# Patient Record
Sex: Male | Born: 1960 | Race: White | Hispanic: No | Smoking: Former smoker
Health system: Southern US, Community
[De-identification: ages and names within clinical notes are randomized; demographics above are authoritative.]

## PROBLEM LIST (undated history)

## (undated) DIAGNOSIS — T4145XA Adverse effect of unspecified anesthetic, initial encounter: Secondary | ICD-10-CM

## (undated) DIAGNOSIS — M1990 Unspecified osteoarthritis, unspecified site: Secondary | ICD-10-CM

## (undated) DIAGNOSIS — F419 Anxiety disorder, unspecified: Secondary | ICD-10-CM

## (undated) DIAGNOSIS — Z973 Presence of spectacles and contact lenses: Secondary | ICD-10-CM

## (undated) DIAGNOSIS — R2689 Other abnormalities of gait and mobility: Secondary | ICD-10-CM

## (undated) DIAGNOSIS — M75102 Unspecified rotator cuff tear or rupture of left shoulder, not specified as traumatic: Secondary | ICD-10-CM

## (undated) DIAGNOSIS — R41840 Attention and concentration deficit: Secondary | ICD-10-CM

## (undated) DIAGNOSIS — E785 Hyperlipidemia, unspecified: Secondary | ICD-10-CM

## (undated) DIAGNOSIS — F329 Major depressive disorder, single episode, unspecified: Secondary | ICD-10-CM

## (undated) DIAGNOSIS — K648 Other hemorrhoids: Secondary | ICD-10-CM

## (undated) DIAGNOSIS — F32A Depression, unspecified: Secondary | ICD-10-CM

## (undated) DIAGNOSIS — F1020 Alcohol dependence, uncomplicated: Secondary | ICD-10-CM

## (undated) DIAGNOSIS — I10 Essential (primary) hypertension: Secondary | ICD-10-CM

## (undated) DIAGNOSIS — M75101 Unspecified rotator cuff tear or rupture of right shoulder, not specified as traumatic: Secondary | ICD-10-CM

## (undated) DIAGNOSIS — G709 Myoneural disorder, unspecified: Secondary | ICD-10-CM

## (undated) DIAGNOSIS — K5790 Diverticulosis of intestine, part unspecified, without perforation or abscess without bleeding: Secondary | ICD-10-CM

## (undated) DIAGNOSIS — T8859XA Other complications of anesthesia, initial encounter: Secondary | ICD-10-CM

## (undated) DIAGNOSIS — K219 Gastro-esophageal reflux disease without esophagitis: Secondary | ICD-10-CM

## (undated) DIAGNOSIS — F429 Obsessive-compulsive disorder, unspecified: Secondary | ICD-10-CM

## (undated) HISTORY — DX: Depression, unspecified: F32.A

## (undated) HISTORY — DX: Other abnormalities of gait and mobility: R26.89

## (undated) HISTORY — PX: KNEE ARTHROSCOPY: SHX127

## (undated) HISTORY — DX: Unspecified osteoarthritis, unspecified site: M19.90

## (undated) HISTORY — PX: INGUINAL HERNIA REPAIR: SHX194

## (undated) HISTORY — PX: KNEE SURGERY: SHX244

## (undated) HISTORY — PX: TONSILLECTOMY: SUR1361

## (undated) HISTORY — DX: Major depressive disorder, single episode, unspecified: F32.9

## (undated) HISTORY — DX: Hyperlipidemia, unspecified: E78.5

## (undated) HISTORY — DX: Obsessive-compulsive disorder, unspecified: F42.9

## (undated) HISTORY — DX: Alcohol dependence, uncomplicated: F10.20

## (undated) HISTORY — DX: Essential (primary) hypertension: I10

## (undated) HISTORY — PX: TYMPANOSTOMY TUBE PLACEMENT: SHX32

## (undated) HISTORY — DX: Attention and concentration deficit: R41.840

## (undated) HISTORY — DX: Myoneural disorder, unspecified: G70.9

---

## 2002-08-10 ENCOUNTER — Emergency Department (HOSPITAL_COMMUNITY): Admission: EM | Admit: 2002-08-10 | Discharge: 2002-08-10 | Payer: Self-pay | Admitting: Emergency Medicine

## 2006-08-17 HISTORY — PX: ACHILLES TENDON REPAIR: SUR1153

## 2008-03-08 ENCOUNTER — Encounter: Admission: RE | Admit: 2008-03-08 | Discharge: 2008-03-08 | Payer: Self-pay | Admitting: Orthopedic Surgery

## 2008-04-04 ENCOUNTER — Encounter: Admission: RE | Admit: 2008-04-04 | Discharge: 2008-04-04 | Payer: Self-pay | Admitting: Orthopedic Surgery

## 2009-08-17 HISTORY — PX: ROTATOR CUFF REPAIR: SHX139

## 2010-02-19 ENCOUNTER — Encounter: Admission: RE | Admit: 2010-02-19 | Discharge: 2010-02-19 | Payer: Self-pay | Admitting: Orthopedic Surgery

## 2010-08-17 DIAGNOSIS — K648 Other hemorrhoids: Secondary | ICD-10-CM

## 2010-08-17 DIAGNOSIS — K5790 Diverticulosis of intestine, part unspecified, without perforation or abscess without bleeding: Secondary | ICD-10-CM

## 2010-08-17 HISTORY — PX: ABCESS DRAINAGE: SHX399

## 2010-08-17 HISTORY — DX: Other hemorrhoids: K64.8

## 2010-08-17 HISTORY — PX: COLONOSCOPY: SHX174

## 2010-08-17 HISTORY — DX: Diverticulosis of intestine, part unspecified, without perforation or abscess without bleeding: K57.90

## 2010-10-07 ENCOUNTER — Emergency Department (HOSPITAL_COMMUNITY)
Admission: EM | Admit: 2010-10-07 | Discharge: 2010-10-07 | Disposition: A | Discharge status: Home / Self Care | Payer: Self-pay | Attending: Emergency Medicine | Admitting: Emergency Medicine

## 2010-10-07 DIAGNOSIS — L03211 Cellulitis of face: Secondary | ICD-10-CM | POA: Insufficient documentation

## 2010-10-07 DIAGNOSIS — I10 Essential (primary) hypertension: Secondary | ICD-10-CM | POA: Insufficient documentation

## 2010-10-07 DIAGNOSIS — L0201 Cutaneous abscess of face: Secondary | ICD-10-CM | POA: Insufficient documentation

## 2010-10-07 DIAGNOSIS — R209 Unspecified disturbances of skin sensation: Secondary | ICD-10-CM | POA: Insufficient documentation

## 2010-10-07 DIAGNOSIS — R51 Headache: Secondary | ICD-10-CM | POA: Insufficient documentation

## 2010-10-08 ENCOUNTER — Ambulatory Visit (HOSPITAL_BASED_OUTPATIENT_CLINIC_OR_DEPARTMENT_OTHER)
Admission: RE | Admit: 2010-10-08 | Discharge: 2010-10-08 | Disposition: A | Discharge status: Home / Self Care | Payer: Self-pay | Source: Ambulatory Visit | Attending: Otolaryngology | Admitting: Otolaryngology

## 2010-10-08 DIAGNOSIS — L0201 Cutaneous abscess of face: Secondary | ICD-10-CM | POA: Insufficient documentation

## 2010-10-08 DIAGNOSIS — L03211 Cellulitis of face: Secondary | ICD-10-CM | POA: Insufficient documentation

## 2010-10-08 DIAGNOSIS — Z01812 Encounter for preprocedural laboratory examination: Secondary | ICD-10-CM | POA: Insufficient documentation

## 2010-10-08 LAB — POCT I-STAT, CHEM 8
BUN: 24 mg/dL — ABNORMAL HIGH (ref 6–23)
Calcium, Ion: 1.12 mmol/L (ref 1.12–1.32)
Chloride: 104 mEq/L (ref 96–112)
Creatinine, Ser: 1.1 mg/dL (ref 0.4–1.5)
Glucose, Bld: 108 mg/dL — ABNORMAL HIGH (ref 70–99)
HCT: 42 % (ref 39.0–52.0)
Hemoglobin: 14.3 g/dL (ref 13.0–17.0)
Potassium: 3.8 mEq/L (ref 3.5–5.1)
Sodium: 138 mEq/L (ref 135–145)
TCO2: 27 mmol/L (ref 0–100)

## 2010-10-11 LAB — CULTURE, ROUTINE-ABSCESS

## 2010-10-13 LAB — ANAEROBIC CULTURE

## 2010-10-15 NOTE — Op Note (Signed)
  NAMECOOLIDGE, GOSSARD               ACCOUNT NO.:  [id number]  MEDICAL RECORD NO.:  [id number]           PATIENT TYPE:  E  LOCATION:  [facility]                         FACILITY:  [facility]  PHYSICIAN:  Betzayda Braxton E. Ezzard Standing, M.D.DATE OF BIRTH:  1961-04-27  DATE OF PROCEDURE:  10/08/2010 DATE OF DISCHARGE:  10/07/2010                              OPERATIVE REPORT   PREOPERATIVE DIAGNOSIS:  Nasal abscess.  POSTOPERATIVE DIAGNOSIS:  Nasal abscess.  OPERATION:  Incision and drainage of nasal abscess with cultures.  SURGEON:  Kristine Garbe. Ezzard Standing, MD  ANESTHESIA:  General endotracheal.  COMPLICATIONS:  None.  BRIEF CLINICAL NOTE:  Adam Harvey is a 50 year old gentleman who developed a sore in his nose, this past weekend, and it has gradually gotten worse.  He had swelling of the upper lip in the base of the nose. He was seen in the emergency room yesterday where he had attempted incision and drainage of the abscess and he was given IV antibiotics as well as doxycycline.  Symptoms have progressively gotten worse over the past 24 hours since being seen in the emergency room and he presented to my office.  On examination, he has an abscess at the base of the columella extending to the upper lip with a scab on the left inferior nostril.  He really did not tolerate the attempted incision and drainage of the abscess in the emergency room well yesterday at all and would rather undergo anesthesia to have incision and drainage of abscess.  He is taken to operating room at this time for incision and drainage of the left nasal abscess.  DESCRIPTION OF PROCEDURE:  After adequate endotracheal anesthesia, the patient received 1 g of Ancef IV preoperatively.  The old scab was removed on the left side of the inferior nostril and pressure was placed on the abscess and a little bit of pus was obtained and this was cultured for regular culture, as well as anaerobe and aerobe.  The opening of the abscess  was then enlarged with a hemostat, the abscess cavity which was estimated to be about 2-3 mL in size, was then irrigated with saline and hydrogen peroxide and about 8-9 cm of quarter- inch Iodoform gauze packing was placed in the abscess cavity.  This completed the procedure.  Antibiotic ointment was applied and he was awoken from anesthesia and transferred to the recovery room and postop doing well.  DISPOSITION:  Adam Harvey was discharged home later this afternoon on continuation of the doxycycline in addition to Keflex 500 mg q.i.d. for 5 days.  He was instructed to remove 2 inches of the Iodoform gauze packing tomorrow and the remaining packing on Friday.  We will notify our office if he has any further problems.          ______________________________ Kristine Garbe Ezzard Standing, M.D.     CEN/MEDQ  D:  10/08/2010  T:  10/09/2010  Job:  [id number]  Electronically Signed by Dillard Cannon M.D. on 10/15/2010 01:22:09 PM

## 2011-04-29 ENCOUNTER — Encounter: Payer: Self-pay | Admitting: *Deleted

## 2011-04-30 ENCOUNTER — Ambulatory Visit (INDEPENDENT_AMBULATORY_CARE_PROVIDER_SITE_OTHER): Payer: 59 | Admitting: Internal Medicine

## 2011-04-30 ENCOUNTER — Encounter: Payer: Self-pay | Admitting: Internal Medicine

## 2011-04-30 VITALS — BP 134/80 | HR 62 | Ht 72.0 in | Wt 241.0 lb

## 2011-04-30 DIAGNOSIS — K625 Hemorrhage of anus and rectum: Secondary | ICD-10-CM

## 2011-04-30 DIAGNOSIS — K5900 Constipation, unspecified: Secondary | ICD-10-CM

## 2011-04-30 DIAGNOSIS — Z1211 Encounter for screening for malignant neoplasm of colon: Secondary | ICD-10-CM

## 2011-04-30 MED ORDER — PEG-KCL-NACL-NASULF-NA ASC-C 100 G PO SOLR: 1.0000 | Freq: Once | ORAL | Status: DC

## 2011-04-30 NOTE — Patient Instructions (Signed)
Colon [facility] 05/06/11 3:00 pm arrive at 2:00 pm on 4th floor Moviprep sent to your pharmcy Colon Brochure given for you to read.

## 2011-04-30 NOTE — Progress Notes (Signed)
HISTORY OF PRESENT ILLNESS:  Adam Harvey is a 50 y.o. male with the below listed medical history who presents today regarding rectal bleeding and the need for screening colonoscopy. The patient reports a 3-4 month history of intermittent rectal bleeding as manifested by bright red blood on the tissue as well as admixed with the stool. This has gotten progressively worse with time. He also reports some rectal discomfort associated with the bleeding as well as increased tendency toward constipation. No abdominal pain or weight loss. No family history of colon cancer. No prior history of colonoscopy. His GI review of systems is otherwise remarkable for occasional heartburn and indigestion. Review of outside laboratories 5 Hemoccult-negative stool in February of 2012. Normal hemoglobin of 14.1 in August of 2012. He does report a remote history of perirectal abscess which required drainage  REVIEW OF SYSTEMS:  All non-GI ROS negative.  Past Medical History  Diagnosis Date  . Hyperlipidemia   . Hypertension   . Obsessive compulsive disorder   . Osteoarthritis   . Depression   . Arthritis   . Alcoholism     sober since 01-2011   . Hemorrhoids     Past Surgical History  Procedure Date  . Achilles tendon repair   . Knee surgery     x 7     Social History Adam Harvey  reports that he has quit smoking. He has never used smokeless tobacco. He reports that he does not drink alcohol or use illicit drugs.  family history is negative for Colon cancer.  No Known Allergies     PHYSICAL EXAMINATION: Vital signs: BP 134/80  Pulse 62  Ht 6' (1.829 m)  Wt 241 lb (109.317 kg)  BMI 32.69 kg/m2  Constitutional: generally well-appearing, no acute distress Psychiatric: alert and oriented x3, cooperative Eyes: extraocular movements intact, anicteric, conjunctiva pink Mouth: oral pharynx moist, no lesions Neck: supple no lymphadenopathy Cardiovascular: heart regular rate and rhythm, no  murmur Lungs: clear to auscultation bilaterally Abdomen: soft, nontender, nondistended, no obvious ascites, no peritoneal signs, normal bowel sounds, no organomegaly Rectal: Deferred until colonoscopy Extremities: no lower extremity edema bilaterally Skin: no lesions on visible extremities Neuro: No focal deficits.   ASSESSMENT:  #1. Intermittent rectal bleeding associated with constipation and rectal discomfort. Rule out seizure. Rule out hemorrhoid. Rule out neoplasia. #2. Colon cancer screening. Baseline risk. Appropriate candidate without contraindication   PLAN:  #1. Colonoscopy.The nature of the procedure, as well as the risks, benefits, and alternatives were carefully and thoroughly reviewed with the patient. Ample time for discussion and questions allowed. The patient understood, was satisfied, and agreed to proceed. Movi prep prescribed. The patient instructed on its use #2. If benign anorectal pathology, treatment plan post procedure

## 2011-05-06 ENCOUNTER — Encounter: Payer: Self-pay | Admitting: Internal Medicine

## 2011-05-06 ENCOUNTER — Ambulatory Visit (AMBULATORY_SURGERY_CENTER): Payer: 59 | Admitting: Internal Medicine

## 2011-05-06 VITALS — HR 65 | Temp 97.5°F | Resp 16 | Ht 72.0 in | Wt 241.0 lb

## 2011-05-06 DIAGNOSIS — K625 Hemorrhage of anus and rectum: Secondary | ICD-10-CM

## 2011-05-06 DIAGNOSIS — K5289 Other specified noninfective gastroenteritis and colitis: Secondary | ICD-10-CM

## 2011-05-06 DIAGNOSIS — Z1211 Encounter for screening for malignant neoplasm of colon: Secondary | ICD-10-CM

## 2011-05-06 DIAGNOSIS — K529 Noninfective gastroenteritis and colitis, unspecified: Secondary | ICD-10-CM

## 2011-05-06 DIAGNOSIS — K5730 Diverticulosis of large intestine without perforation or abscess without bleeding: Secondary | ICD-10-CM

## 2011-05-06 MED ORDER — SODIUM CHLORIDE 0.9 % IV SOLN: 500.0000 mL | INTRAVENOUS | Status: DC

## 2011-05-06 NOTE — Patient Instructions (Signed)
Discharge instructions given with verbal understanding. Handouts on diverticulosis and hemorrhoids given. Resume previous medications. 

## 2011-05-07 ENCOUNTER — Telehealth: Payer: Self-pay | Admitting: *Deleted

## 2011-05-07 NOTE — Telephone Encounter (Signed)

## 2014-05-17 ENCOUNTER — Other Ambulatory Visit (INDEPENDENT_AMBULATORY_CARE_PROVIDER_SITE_OTHER): Payer: Self-pay | Admitting: General Surgery

## 2014-05-17 DIAGNOSIS — IMO0002 Reserved for concepts with insufficient information to code with codable children: Secondary | ICD-10-CM

## 2014-05-17 DIAGNOSIS — R229 Localized swelling, mass and lump, unspecified: Principal | ICD-10-CM

## 2014-05-20 LAB — WOUND CULTURE
Gram Stain: NONE SEEN
Gram Stain: NONE SEEN

## 2014-08-01 ENCOUNTER — Encounter (HOSPITAL_BASED_OUTPATIENT_CLINIC_OR_DEPARTMENT_OTHER): Payer: Self-pay | Admitting: *Deleted

## 2014-08-01 ENCOUNTER — Other Ambulatory Visit: Payer: Self-pay

## 2014-08-01 ENCOUNTER — Encounter (HOSPITAL_BASED_OUTPATIENT_CLINIC_OR_DEPARTMENT_OTHER)
Admission: RE | Admit: 2014-08-01 | Discharge: 2014-08-01 | Disposition: A | Discharge status: Home / Self Care | Payer: 59 | Source: Ambulatory Visit | Attending: Orthopedic Surgery | Admitting: Orthopedic Surgery

## 2014-08-01 DIAGNOSIS — Z01818 Encounter for other preprocedural examination: Secondary | ICD-10-CM | POA: Diagnosis present

## 2014-08-01 LAB — BASIC METABOLIC PANEL
Anion gap: 14 (ref 5–15)
BUN: 26 mg/dL — ABNORMAL HIGH (ref 6–23)
CO2: 23 mEq/L (ref 19–32)
Calcium: 9.3 mg/dL (ref 8.4–10.5)
Chloride: 101 mEq/L (ref 96–112)
Creatinine, Ser: 0.85 mg/dL (ref 0.50–1.35)
GFR calc Af Amer: >90 mL/min (ref >90)
GFR calc non Af Amer: >90 mL/min (ref >90)
Glucose, Bld: 140 mg/dL — ABNORMAL HIGH (ref 70–99)
Potassium: 4.5 mEq/L (ref 3.7–5.3)
Sodium: 138 mEq/L (ref 137–147)

## 2014-08-01 NOTE — Progress Notes (Signed)
   08/01/14 1045  OBSTRUCTIVE SLEEP APNEA  Have you ever been diagnosed with sleep apnea through a sleep study? No  Do you snore loudly (loud enough to be heard through closed doors)?  0  Do you often feel tired, fatigued, or sleepy during the daytime? 0  Has anyone observed you stop breathing during your sleep? 0  Do you have, or are you being treated for high blood pressure? 1  BMI more than 35 kg/m2? 0  Age over 53 years old? 1  Neck circumference greater than 40 cm/16 inches? 1  Gender: 1  Obstructive Sleep Apnea Score 4  Score 4 or greater  Results sent to PCP

## 2014-08-01 NOTE — Progress Notes (Signed)
To come in for [id number] 

## 2014-08-03 ENCOUNTER — Encounter (HOSPITAL_BASED_OUTPATIENT_CLINIC_OR_DEPARTMENT_OTHER): Payer: Self-pay

## 2014-08-03 ENCOUNTER — Ambulatory Visit (HOSPITAL_BASED_OUTPATIENT_CLINIC_OR_DEPARTMENT_OTHER): Payer: BC Managed Care – PPO | Admitting: Anesthesiology

## 2014-08-03 ENCOUNTER — Ambulatory Visit (HOSPITAL_BASED_OUTPATIENT_CLINIC_OR_DEPARTMENT_OTHER)
Admission: RE | Admit: 2014-08-03 | Discharge: 2014-08-03 | Disposition: A | Discharge status: Home / Self Care | Payer: BC Managed Care – PPO | Source: Ambulatory Visit | Attending: Orthopedic Surgery | Admitting: Orthopedic Surgery

## 2014-08-03 ENCOUNTER — Encounter (HOSPITAL_BASED_OUTPATIENT_CLINIC_OR_DEPARTMENT_OTHER)
Admission: RE | Disposition: A | Discharge status: Home / Self Care | Payer: Self-pay | Source: Ambulatory Visit | Attending: Orthopedic Surgery

## 2014-08-03 DIAGNOSIS — F329 Major depressive disorder, single episode, unspecified: Secondary | ICD-10-CM | POA: Diagnosis not present

## 2014-08-03 DIAGNOSIS — M19011 Primary osteoarthritis, right shoulder: Secondary | ICD-10-CM | POA: Insufficient documentation

## 2014-08-03 DIAGNOSIS — I10 Essential (primary) hypertension: Secondary | ICD-10-CM | POA: Insufficient documentation

## 2014-08-03 DIAGNOSIS — F42 Obsessive-compulsive disorder: Secondary | ICD-10-CM | POA: Diagnosis not present

## 2014-08-03 DIAGNOSIS — X58XXXA Exposure to other specified factors, initial encounter: Secondary | ICD-10-CM | POA: Insufficient documentation

## 2014-08-03 DIAGNOSIS — M75101 Unspecified rotator cuff tear or rupture of right shoulder, not specified as traumatic: Secondary | ICD-10-CM | POA: Diagnosis present

## 2014-08-03 DIAGNOSIS — Z7982 Long term (current) use of aspirin: Secondary | ICD-10-CM | POA: Insufficient documentation

## 2014-08-03 DIAGNOSIS — E785 Hyperlipidemia, unspecified: Secondary | ICD-10-CM | POA: Insufficient documentation

## 2014-08-03 DIAGNOSIS — Z800 Family history of malignant neoplasm of digestive organs: Secondary | ICD-10-CM | POA: Diagnosis not present

## 2014-08-03 DIAGNOSIS — Y939 Activity, unspecified: Secondary | ICD-10-CM | POA: Insufficient documentation

## 2014-08-03 DIAGNOSIS — Z79899 Other long term (current) drug therapy: Secondary | ICD-10-CM | POA: Insufficient documentation

## 2014-08-03 DIAGNOSIS — M7541 Impingement syndrome of right shoulder: Secondary | ICD-10-CM | POA: Insufficient documentation

## 2014-08-03 DIAGNOSIS — F419 Anxiety disorder, unspecified: Secondary | ICD-10-CM | POA: Diagnosis not present

## 2014-08-03 DIAGNOSIS — Y999 Unspecified external cause status: Secondary | ICD-10-CM | POA: Insufficient documentation

## 2014-08-03 DIAGNOSIS — F1021 Alcohol dependence, in remission: Secondary | ICD-10-CM | POA: Diagnosis not present

## 2014-08-03 DIAGNOSIS — Y929 Unspecified place or not applicable: Secondary | ICD-10-CM | POA: Insufficient documentation

## 2014-08-03 DIAGNOSIS — Z87891 Personal history of nicotine dependence: Secondary | ICD-10-CM | POA: Insufficient documentation

## 2014-08-03 DIAGNOSIS — S43421A Sprain of right rotator cuff capsule, initial encounter: Secondary | ICD-10-CM | POA: Insufficient documentation

## 2014-08-03 DIAGNOSIS — S46011A Strain of muscle(s) and tendon(s) of the rotator cuff of right shoulder, initial encounter: Secondary | ICD-10-CM | POA: Diagnosis present

## 2014-08-03 HISTORY — DX: Unspecified rotator cuff tear or rupture of right shoulder, not specified as traumatic: M75.101

## 2014-08-03 HISTORY — PX: ARTHOSCOPIC ROTAOR CUFF REPAIR: SHX5002

## 2014-08-03 HISTORY — DX: Presence of spectacles and contact lenses: Z97.3

## 2014-08-03 HISTORY — DX: Other complications of anesthesia, initial encounter: T88.59XA

## 2014-08-03 HISTORY — DX: Anxiety disorder, unspecified: F41.9

## 2014-08-03 HISTORY — DX: Adverse effect of unspecified anesthetic, initial encounter: T41.45XA

## 2014-08-03 LAB — POCT HEMOGLOBIN-HEMACUE: Hemoglobin: 15.1 g/dL (ref 13.0–17.0)

## 2014-08-03 SURGERY — SHOULDER ARTHROSCOPY WITH SUBACROMIAL DECOMPRESSION AND DISTAL CLAVICLE EXCISION
Anesthesia: Regional | Site: Shoulder | Laterality: Right

## 2014-08-03 MED ORDER — LACTATED RINGERS IV SOLN
INTRAVENOUS | Status: DC
  Administered 2014-08-03: 14:00:00 via INTRAVENOUS

## 2014-08-03 MED ORDER — FENTANYL CITRATE 0.05 MG/ML IJ SOLN
INTRAMUSCULAR | Status: AC
  Filled 2014-08-03: qty 2

## 2014-08-03 MED ORDER — SUCCINYLCHOLINE CHLORIDE 20 MG/ML IJ SOLN
INTRAMUSCULAR | Status: DC | PRN
  Administered 2014-08-03: 100 mg via INTRAVENOUS

## 2014-08-03 MED ORDER — EPHEDRINE SULFATE 50 MG/ML IJ SOLN
INTRAMUSCULAR | Status: DC | PRN
  Administered 2014-08-03 (×3): 10 mg via INTRAVENOUS

## 2014-08-03 MED ORDER — SENNA-DOCUSATE SODIUM 8.6-50 MG PO TABS: 2.0000 | ORAL_TABLET | Freq: Every day | ORAL | Status: DC

## 2014-08-03 MED ORDER — SODIUM CHLORIDE 0.9 % IR SOLN
Status: DC | PRN
  Administered 2014-08-03: 28000 mL

## 2014-08-03 MED ORDER — PROPOFOL 10 MG/ML IV BOLUS
INTRAVENOUS | Status: DC | PRN
  Administered 2014-08-03: 250 mg via INTRAVENOUS
  Administered 2014-08-03: 50 mg via INTRAVENOUS

## 2014-08-03 MED ORDER — OXYCODONE HCL 5 MG/5ML PO SOLN: 5.0000 mg | Freq: Once | ORAL | Status: DC | PRN

## 2014-08-03 MED ORDER — GLYCOPYRROLATE 0.2 MG/ML IJ SOLN
INTRAMUSCULAR | Status: DC | PRN
  Administered 2014-08-03: 0.2 mg via INTRAVENOUS

## 2014-08-03 MED ORDER — DEXAMETHASONE SODIUM PHOSPHATE 4 MG/ML IJ SOLN
INTRAMUSCULAR | Status: DC | PRN
  Administered 2014-08-03: 10 mg via INTRAVENOUS

## 2014-08-03 MED ORDER — HYDROMORPHONE HCL 1 MG/ML IJ SOLN: 0.2500 mg | INTRAMUSCULAR | Status: DC | PRN

## 2014-08-03 MED ORDER — ONDANSETRON HCL 4 MG/2ML IJ SOLN
INTRAMUSCULAR | Status: DC | PRN
  Administered 2014-08-03: 4 mg via INTRAVENOUS

## 2014-08-03 MED ORDER — CEFAZOLIN SODIUM-DEXTROSE 2-3 GM-% IV SOLR
2.0000 g | INTRAVENOUS | Status: AC
  Administered 2014-08-03: 2 g via INTRAVENOUS

## 2014-08-03 MED ORDER — BACLOFEN 10 MG PO TABS: 10.0000 mg | ORAL_TABLET | Freq: Three times a day (TID) | ORAL | Status: DC

## 2014-08-03 MED ORDER — OXYCODONE-ACETAMINOPHEN 10-325 MG PO TABS: 1.0000 | ORAL_TABLET | Freq: Four times a day (QID) | ORAL | Status: DC | PRN

## 2014-08-03 MED ORDER — MIDAZOLAM HCL 2 MG/2ML IJ SOLN
INTRAMUSCULAR | Status: AC
  Filled 2014-08-03: qty 2

## 2014-08-03 MED ORDER — BUPIVACAINE-EPINEPHRINE (PF) 0.5% -1:200000 IJ SOLN
INTRAMUSCULAR | Status: DC | PRN
  Administered 2014-08-03: 30 mL via PERINEURAL

## 2014-08-03 MED ORDER — ONDANSETRON HCL 4 MG PO TABS: 4.0000 mg | ORAL_TABLET | Freq: Three times a day (TID) | ORAL | Status: DC | PRN

## 2014-08-03 MED ORDER — PROMETHAZINE HCL 25 MG/ML IJ SOLN: 6.2500 mg | INTRAMUSCULAR | Status: DC | PRN

## 2014-08-03 MED ORDER — OXYCODONE HCL 5 MG PO TABS: 5.0000 mg | ORAL_TABLET | Freq: Once | ORAL | Status: DC | PRN

## 2014-08-03 MED ORDER — MIDAZOLAM HCL 2 MG/2ML IJ SOLN
1.0000 mg | INTRAMUSCULAR | Status: DC | PRN
  Administered 2014-08-03 (×2): 2 mg via INTRAVENOUS

## 2014-08-03 MED ORDER — HYDROMORPHONE HCL 2 MG PO TABS: 2.0000 mg | ORAL_TABLET | ORAL | Status: DC | PRN

## 2014-08-03 MED ORDER — FENTANYL CITRATE 0.05 MG/ML IJ SOLN
50.0000 ug | INTRAMUSCULAR | Status: DC | PRN
  Administered 2014-08-03 (×2): 100 ug via INTRAVENOUS

## 2014-08-03 MED ORDER — FENTANYL CITRATE 0.05 MG/ML IJ SOLN
INTRAMUSCULAR | Status: AC
  Filled 2014-08-03: qty 6

## 2014-08-03 MED ORDER — LACTATED RINGERS IV SOLN
INTRAVENOUS | Status: DC | PRN
  Administered 2014-08-03 (×3): via INTRAVENOUS

## 2014-08-03 SURGICAL SUPPLY — 65 items
ANCHOR SUT BIO SW 4.75X19.1 (Anchor) ×6 IMPLANT
BLADE CUTTER GATOR 3.5 (BLADE) ×2 IMPLANT
BLADE GREAT WHITE 4.2 (BLADE) IMPLANT
BLADE SURG 15 STRL LF DISP TIS (BLADE) IMPLANT
BLADE SURG 15 STRL SS (BLADE)
BLADE VORTEX 6.0 (BLADE) IMPLANT
BUR OVAL 4.0 (BURR) IMPLANT
BUR OVAL 6.0 (BURR) ×2 IMPLANT
CANNULA 5.75X71 LONG (CANNULA) ×2 IMPLANT
CANNULA TWIST IN 8.25X7CM (CANNULA) ×6 IMPLANT
CLSR STERI-STRIP ANTIMIC 1/2X4 (GAUZE/BANDAGES/DRESSINGS) ×2 IMPLANT
DECANTER SPIKE VIAL GLASS SM (MISCELLANEOUS) IMPLANT
DRAPE INCISE IOBAN 66X45 STRL (DRAPES) ×2 IMPLANT
DRAPE SHOULDER BEACH CHAIR (DRAPES) ×2 IMPLANT
DRAPE U 20/CS (DRAPES) ×2 IMPLANT
DRAPE U-SHAPE 47X51 STRL (DRAPES) ×2 IMPLANT
DRSG PAD ABDOMINAL 8X10 ST (GAUZE/BANDAGES/DRESSINGS) ×2 IMPLANT
DURAPREP 26ML APPLICATOR (WOUND CARE) ×2 IMPLANT
ELECT REM PT RETURN 9FT ADLT (ELECTROSURGICAL) ×2
ELECTRODE REM PT RTRN 9FT ADLT (ELECTROSURGICAL) ×1 IMPLANT
FIBERSTICK 2 (SUTURE) IMPLANT
GAUZE SPONGE 4X4 12PLY STRL (GAUZE/BANDAGES/DRESSINGS) ×2 IMPLANT
GLOVE BIO SURGEON STRL SZ7 (GLOVE) ×2 IMPLANT
GLOVE BIO SURGEON STRL SZ8 (GLOVE) ×4 IMPLANT
GLOVE BIOGEL PI IND STRL 7.5 (GLOVE) ×1 IMPLANT
GLOVE BIOGEL PI IND STRL 8 (GLOVE) ×3 IMPLANT
GLOVE BIOGEL PI INDICATOR 7.5 (GLOVE) ×1
GLOVE BIOGEL PI INDICATOR 8 (GLOVE) ×3
GLOVE EXAM NITRILE MD LF STRL (GLOVE) ×2 IMPLANT
GLOVE ORTHO TXT STRL SZ7.5 (GLOVE) ×2 IMPLANT
GOWN STRL REUS W/ TWL LRG LVL3 (GOWN DISPOSABLE) ×1 IMPLANT
GOWN STRL REUS W/ TWL XL LVL3 (GOWN DISPOSABLE) ×3 IMPLANT
GOWN STRL REUS W/TWL LRG LVL3 (GOWN DISPOSABLE) ×1
GOWN STRL REUS W/TWL XL LVL3 (GOWN DISPOSABLE) ×3
IMMOBILIZER SHOULDER FOAM XLGE (SOFTGOODS) ×2 IMPLANT
KIT SHOULDER TRACTION (DRAPES) ×2 IMPLANT
LASSO 90 CVE QUICKPAS (DISPOSABLE) ×2 IMPLANT
MANIFOLD NEPTUNE II (INSTRUMENTS) ×2 IMPLANT
NEEDLE SCORPION MULTI FIRE (NEEDLE) ×2 IMPLANT
PACK ARTHROSCOPY DSU (CUSTOM PROCEDURE TRAY) ×2 IMPLANT
PACK BASIN DAY SURGERY FS (CUSTOM PROCEDURE TRAY) ×2 IMPLANT
SET ARTHROSCOPY TUBING (MISCELLANEOUS) ×1
SET ARTHROSCOPY TUBING LN (MISCELLANEOUS) ×1 IMPLANT
SHEET MEDIUM DRAPE 40X70 STRL (DRAPES) ×2 IMPLANT
SLEEVE SCD COMPRESS KNEE MED (MISCELLANEOUS) ×2 IMPLANT
SLING ARM IMMOBILIZER LRG (SOFTGOODS) IMPLANT
SLING ARM IMMOBILIZER MED (SOFTGOODS) IMPLANT
SLING ARM LRG ADULT FOAM STRAP (SOFTGOODS) IMPLANT
SLING ARM MED ADULT FOAM STRAP (SOFTGOODS) IMPLANT
SLING ARM XL FOAM STRAP (SOFTGOODS) IMPLANT
SUT FIBERWIRE #2 38 T-5 BLUE (SUTURE)
SUT MNCRL AB 4-0 PS2 18 (SUTURE) ×2 IMPLANT
SUT PDS AB 0 CT 36 (SUTURE) ×2 IMPLANT
SUT PDS AB 1 CT  36 (SUTURE)
SUT PDS AB 1 CT 36 (SUTURE) IMPLANT
SUT TIGER TAPE 7 IN WHITE (SUTURE) ×4 IMPLANT
SUT VIC AB 3-0 SH 27 (SUTURE)
SUT VIC AB 3-0 SH 27X BRD (SUTURE) IMPLANT
SUTURE FIBERWR #2 38 T-5 BLUE (SUTURE) IMPLANT
TAPE FIBER 2MM 7IN #2 BLUE (SUTURE) ×4 IMPLANT
TOWEL OR 17X24 6PK STRL BLUE (TOWEL DISPOSABLE) ×2 IMPLANT
TOWEL OR NON WOVEN STRL DISP B (DISPOSABLE) ×2 IMPLANT
TUBE CONNECTING 20X1/4 (TUBING) ×2 IMPLANT
WAND STAR VAC 90 (SURGICAL WAND) ×2 IMPLANT
WATER STERILE IRR 1000ML POUR (IV SOLUTION) ×2 IMPLANT

## 2014-08-03 NOTE — Anesthesia Postprocedure Evaluation (Signed)
Anesthesia Post Note  Patient: Adam Harvey  Procedure(s) Performed: Procedure(s) (LRB): RIGHT SHOULDER ARTHROSCOPY WITH EXTENSIVE DEBRIDEMENT; ACROMIOPLASTY,  AND DISTAL CLAVICLE RESECTION (Right) ARTHROSCOPIC ROTATOR CUFF REPAIR (Right)  Anesthesia type: general  Patient location: PACU  Post pain: Pain level controlled  Post assessment: Patient's Cardiovascular Status Stable  Last Vitals:  Filed Vitals:   08/03/14 1845  BP: 138/93  Pulse: 84  Temp:   Resp: 19    Post vital signs: Reviewed and stable  Level of consciousness: sedated  Complications: No apparent anesthesia complications

## 2014-08-03 NOTE — Anesthesia Procedure Notes (Addendum)
Anesthesia Regional Block:  Interscalene brachial plexus block  Pre-Anesthetic Checklist: ,, timeout performed, Correct Patient, Correct Site, Correct Laterality, Correct Procedure, Correct Position, site marked, Risks and benefits discussed,  Surgical consent,  Pre-op evaluation,  At surgeon's request and post-op pain management  Laterality: Right  Prep: chloraprep       Needles:  Injection technique: Single-shot  Needle Type: Echogenic Stimulator Needle     Needle Length: 5cm 5 cm Needle Gauge: 22 and 22 G    Additional Needles:  Procedures: ultrasound guided (picture in chart) and nerve stimulator Interscalene brachial plexus block  Nerve Stimulator or Paresthesia:  Response: bicep contraction, 0.45 mA,   Additional Responses:   Narrative:  Start time: 08/03/2014 2:23 PM End time: 08/03/2014 2:33 PM Injection made incrementally with aspirations every 5 mL.  Performed by: Personally  Anesthesiologist: Heather RobertsSINGER, JAMES  Additional Notes: Functioning IV was confirmed and monitors applied.  A 50mm 22ga echogenic arrow stimulator was used. Sterile prep and drape,hand hygiene and sterile gloves were used.Ultrasound guidance: relevant anatomy identified, needle position confirmed, local anesthetic spread visualized around nerve(s)., vascular puncture avoided.  Image printed for medical record.  Negative aspiration and negative test dose prior to incremental administration of local anesthetic. The patient tolerated the procedure well.   Procedure Name: Intubation Date/Time: 08/03/2014 2:50 PM Performed by: Gar GibbonKEETON, DENNIS S Pre-anesthesia Checklist: Patient identified, Emergency Drugs available, Suction available and Patient being monitored Patient Re-evaluated:Patient Re-evaluated prior to inductionOxygen Delivery Method: Circle System Utilized Preoxygenation: Pre-oxygenation with 100% oxygen Intubation Type: IV induction Ventilation: Mask ventilation without  difficulty Laryngoscope Size: Mac and 4 Grade View: Grade I Tube type: Oral Tube size: 8.0 mm Number of attempts: 1 Airway Equipment and Method: stylet and oral airway Placement Confirmation: ETT inserted through vocal cords under direct vision,  positive ETCO2 and breath sounds checked- equal and bilateral Secured at: 22 cm Tube secured with: Tape Dental Injury: Teeth and Oropharynx as per pre-operative assessment

## 2014-08-03 NOTE — Discharge Instructions (Signed)
Diet: As you were doing prior to hospitalization  ° °Shower:  May shower but keep the wounds dry, use an occlusive plastic wrap, NO SOAKING IN TUB.  If the bandage gets wet, change with a clean dry gauze. ° °Dressing:  You may change your dressing 3-5 days after surgery.  Then change the dressing daily with sterile gauze dressing.   ° °There are sticky tapes (steri-strips) on your wounds and all the stitches are absorbable.  Leave the steri-strips in place when changing your dressings, they will peel off with time, usually 2-3 weeks. ° °Activity:  Increase activity slowly as tolerated, but follow the weight bearing instructions below.  No lifting or driving for 6 weeks. ° °Weight Bearing:   Sling at all times..   ° °To prevent constipation: you may use a stool softener such as - ° °Colace (over the counter) 100 mg by mouth twice a day  °Drink plenty of fluids (prune juice may be helpful) and high fiber foods °Miralax (over the counter) for constipation as needed.   ° °Itching:  If you experience itching with your medications, try taking only a single pain pill, or even half a pain pill at a time.  You may take up to 10 pain pills per day, and you can also use benadryl over the counter for itching or also to help with sleep.  ° °Precautions:  If you experience chest pain or shortness of breath - call 911 immediately for transfer to the hospital emergency department!! ° °If you develop a fever greater that 101 F, purulent drainage from wound, increased redness or drainage from wound, or calf pain -- Call the office at [phone]                                                °Follow- Up Appointment:  Please call for an appointment to be seen in 2 weeks [place] - [phone] ° ° °Post Anesthesia Home Care Instructions ° °Activity: °Get plenty of rest for the remainder of the day. A responsible adult should stay with you for 24 hours following the procedure.  °For the next 24 hours, DO NOT: °-Drive a  car °-Operate machinery °-Drink alcoholic beverages °-Take any medication unless instructed by your physician °-Make any legal decisions or sign important papers. ° °Meals: °Start with liquid foods such as gelatin or soup. Progress to regular foods as tolerated. Avoid greasy, spicy, heavy foods. If nausea and/or vomiting occur, drink only clear liquids until the nausea and/or vomiting subsides. Call your physician if vomiting continues. ° °Special Instructions/Symptoms: °Your throat may feel dry or sore from the anesthesia or the breathing tube placed in your throat during surgery. If this causes discomfort, gargle with warm salt water. The discomfort should disappear within 24 hours. ° ° °Regional Anesthesia Blocks ° °1. Numbness or the inability to move the "blocked" extremity may last from 3-48 hours after placement. The length of time depends on the medication injected and your individual response to the medication. If the numbness is not going away after 48 hours, call your surgeon. ° °2. The extremity that is blocked will need to be protected until the numbness is gone and the  Strength has returned. Because you cannot feel it, you will need to take extra care to avoid injury. Because it may be weak, you may have difficulty moving   it or using it. You may not know what position it is in without looking at it while the block is in effect. ° °3. For blocks in the legs and feet, returning to weight bearing and walking needs to be done carefully. You will need to wait until the numbness is entirely gone and the strength has returned. You should be able to move your leg and foot normally before you try and bear weight or walk. You will need someone to be with you when you first try to ensure you do not fall and possibly risk injury. ° °4. Bruising and tenderness at the needle site are common side effects and will resolve in a few days. ° °5. Persistent numbness or new problems with movement should be communicated to  the surgeon or the [facility] ([phone])/ [facility] ([phone]). ° ° ° °

## 2014-08-03 NOTE — Transfer of Care (Signed)
Immediate Anesthesia Transfer of Care Note  Patient: Adam Harvey  Procedure(s) Performed: Procedure(s) with comments: RIGHT SHOULDER ARTHROSCOPY WITH EXTENSIVE DEBRIDEMENT; ACROMIOPLASTY,  AND DISTAL CLAVICLE RESECTION (Right) - ANESTHESIA:  GENERAL, PRE/POST OP SCALENE ARTHROSCOPIC ROTATOR CUFF REPAIR (Right)  Patient Location: PACU  Anesthesia Type:GA combined with regional for post-op pain  Level of Consciousness: awake, alert , oriented and patient cooperative  Airway & Oxygen Therapy: Patient Spontanous Breathing and Patient connected to face mask oxygen  Post-op Assessment: Report given to PACU RN, Post -op Vital signs reviewed and stable and Patient moving all extremities  Post vital signs: Reviewed and stable  Complications: No apparent anesthesia complications

## 2014-08-03 NOTE — Anesthesia Preprocedure Evaluation (Signed)
Anesthesia Evaluation   Patient awake    Reviewed: Allergy & Precautions, NPO status , Patient's Chart, lab work & pertinent test results  History of Anesthesia Complications Negative for: history of anesthetic complications  Airway        Dental   Pulmonary former smoker,          Cardiovascular hypertension, Pt. on medications and Pt. on home beta blockers     Neuro/Psych PSYCHIATRIC DISORDERS Anxiety Depression negative neurological ROS     GI/Hepatic negative GI ROS, Neg liver ROS,   Endo/Other  negative endocrine ROS  Renal/GU negative Renal ROS     Musculoskeletal   Abdominal   Peds  Hematology   Anesthesia Other Findings   Reproductive/Obstetrics                             Anesthesia Physical Anesthesia Plan  ASA: II  Anesthesia Plan: General   Post-op Pain Management:    Induction: Intravenous  Airway Management Planned: Oral ETT  Additional Equipment:   Intra-op Plan:   Post-operative Plan: Extubation in OR  Informed Consent:   Plan Discussed with:   Anesthesia Plan Comments:         Anesthesia Quick Evaluation

## 2014-08-03 NOTE — H&P (Signed)
PREOPERATIVE H&P  Chief Complaint: Right shoulder pain  HPI: Adam Harvey is a 53 y.o. male who presents for preoperative history and physical with a diagnosis of right shoulder large subscapularis tear with biceps dislocation. Symptoms are rated as moderate to severe, and have been worsening.  This is significantly impairing activities of daily living.  He has elected for surgical management. Pain is rated 7/10, worse at night, and using Norco, and he has had a history of left-sided rotator cuff repair which she says felt similar to his current symptoms. He did feel a pop while reaching up to a door 07/07/2014, which may have started his pain.  Past Medical History  Diagnosis Date  . Hyperlipidemia   . Hypertension   . Obsessive compulsive disorder   . Osteoarthritis   . Depression   . Arthritis   . Alcoholism     sober since 01-2011   . Hemorrhoids   . Anxiety   . Complication of anesthesia     sore throat  . Wears contact lenses    Past Surgical History  Procedure Laterality Date  . Achilles tendon repair  2008    right  . Knee surgery      x3-right  . Knee arthroscopy      left x4  . Tonsillectomy    . Inguinal hernia repair      right as infant  . Tympanostomy tube placement      as child  . Shoulder arthroscopy  2011    left  . Abcess drainage  2012    nasal cyst   History   Social History  . Marital Status: Divorced    Spouse Name: [name]    Number of Children: 3  . Years of Education: [name]   Occupational History  . Program Director    Social History Main Topics  . Smoking status: Former Smoker    Types: E-cigarettes    Quit date: 08/01/2010  . Smokeless tobacco: Never Used  . Alcohol Use: No     Comment: not since 2012  . Drug Use: No  . Sexual Activity: None     Comment: uses e-cig   Other Topics Concern  . None   Social History Narrative   Family History  Problem Relation Age of Onset  . Colon cancer Neg Hx    No Known Allergies Prior to  Admission medications   Medication Sig Start Date End Date Taking? Authorizing Provider  aspirin 81 MG tablet Take 81 mg by mouth daily.     Yes Historical Provider, MD  FLUoxetine (PROZAC) 20 MG tablet Take 20 mg by mouth daily.   Yes Historical Provider, MD  HYDROcodone-acetaminophen (NORCO) 7.5-325 MG per tablet Take 1 tablet by mouth every 6 (six) hours as needed for moderate pain.   Yes Historical Provider, MD  HYDROcodone-acetaminophen (NORCO/VICODIN) 5-325 MG per tablet Take 1 tablet by mouth every 6 (six) hours as needed for moderate pain.   Yes Historical Provider, MD  lisinopril-hydrochlorothiazide (PRINZIDE,ZESTORETIC) 20-12.5 MG per tablet Take 1 tablet by mouth daily.     Yes Historical Provider, MD  Multiple Vitamins-Minerals (MULTIVITAMIN WITH MINERALS) tablet Take 1 tablet by mouth daily.   Yes Historical Provider, MD  propranolol (INDERAL) 40 MG tablet Take 60 mg by mouth daily.     Yes Historical Provider, MD  simvastatin (ZOCOR) 40 MG tablet Take 40 mg by mouth at bedtime.     Yes Historical Provider, MD     Positive ROS:  All other systems have been reviewed and were otherwise negative with the exception of those mentioned in the HPI and as above.  Physical Exam: General: Alert, no acute distress Cardiovascular: No pedal edema Respiratory: No cyanosis, no use of accessory musculature GI: No organomegaly, abdomen is soft and non-tender Skin: No lesions in the area of chief complaint Neurologic: Sensation intact distally Psychiatric: Patient is competent for consent with normal mood and affect Lymphatic: No axillary or cervical lymphadenopathy  MUSCULOSKELETAL: Right shoulder active motion is 0-90. External rotation is to 10. Positive liftoff test and positive belly press test with positive pain over the biceps.  Supraspinatus strength is limited secondary to pain.  Assessment: Right shoulder retracted subscapularis tear, that is very large, with some possible tearing  of the supraspinatus and possible biceps tendon rupture.  Plan: Plan for Procedure(s): Right shoulder arthroscopy with rotator cuff repair, subscapularis repair, acromioplasty, extensive debridement, possible biceps tear lysis.  The risks benefits and alternatives were discussed with the patient including but not limited to the risks of nonoperative treatment, versus surgical intervention including infection, bleeding, nerve injury,  blood clots, cardiopulmonary complications, morbidity, mortality, among others, and they were willing to proceed. We've also discussed the risks for persistent symptoms and incomplete relief of pain as well as recurrent rupture of the rotator cuff.  Eulas PostLANDAU,Newell Frater P, MD Cell [phone]   08/03/2014 1:54 PM

## 2014-08-03 NOTE — Op Note (Signed)
08/03/2014  5:45 PM  PATIENT:  Adam Harvey    PRE-OPERATIVE DIAGNOSIS:  Right shoulder subscapularis and supraspinatus tear with biceps tendon rupture and impingement syndrome and before meals joint arthritis  POST-OPERATIVE DIAGNOSIS:  Same  PROCEDURE:  RIGHT SHOULDER ARTHROSCOPY WITH EXTENSIVE DEBRIDEMENT; ACROMIOPLASTY,  AND DISTAL CLAVICLE RESECTION, ARTHROSCOPIC ROTATOR CUFF REPAIR of the supraspinatus and subscapularis  SURGEON:  Eulas PostLANDAU,Terryn Rosenkranz P, MD  PHYSICIAN ASSISTANT: Janace LittenBrandon Parry, OPA-C, present and scrubbed throughout the case, critical for completion in a timely fashion, and for retraction, instrumentation, and closure.  ANESTHESIA:   General  PREOPERATIVE INDICATIONS:  Adam Harvey is a  53 y.o. male who tore his right rotator cuff and who failed conservative measures and elected for surgical management.    The risks benefits and alternatives were discussed with the patient preoperatively including but not limited to the risks of infection, bleeding, nerve injury, cardiopulmonary complications, the need for revision surgery, among others, and the patient was willing to proceed.  OPERATIVE IMPLANTS: Arthrex bio composite 4.75 mm swivel lock 3, one for the subscapularis, 2 for the supraspinatus  OPERATIVE FINDINGS: The biceps tendon was ruptured. The subscapularis was also ruptured and was retracted at least 2 cm. The supraspinatus was also torn back to the junction of the infraspinatus. This was minimally retracted. The tendon quality was overall quite good, and the bone quality was also quite good. The articular cartilage was in good condition.  OPERATIVE PROCEDURE: The patient was brought to the operating room and placed in the supine position. Gen. anesthesia was administered. IV antibiotics were given. He was turned in a semilateral decubitus position and all bony prominences padded. Examination under anesthesia demonstrated full motion. The right upper extremity was  then prepped and draped in usual sterile fashion. Time out was performed. Diagnostic arthroscopy was carried out the above-named findings were noted. I used an anterior cannula and a superior cannula and released the scarred in subscapularis tissue in the rotator interval, mobilize the subscapularis and then passed an inverted fiber tape. I then anchored this into the head with a 4.75 mm swivel lock. Excellent fixation was achieved and the tendon appearance of was restored almost to normal.  I then went to the subacromial space, and I performed a complete bursectomy, debridement, CA ligament release, the acromioclavicular joint was actually already exposed and there was a fair amount of degenerative changes noted, and given that I elected to perform a distal clavicle resection.  First however I identified the tear in the supraspinatus superiorly, performed a light tubercleplasty, which I also done a tubercleplasty for the lesser tuberosity, and then placed a lateral and superior cannula. I used a bird beak suture passer to pass a posterior fiber tape, and then the scorpion to pass the anterior fiber tape and FiberWire. Excellent fixation on the cuff was achieved. I placed a total of 24.75 millimeters suture anchors securing the cuff into anatomic location. There is no dog ear.  I then resected 1 cm of the distal clavicle, confirming from AP and lateral views.  The instruments were removed, the portals closed with Monocryl followed by Steri-Strips and sterile gauze. He was awakened and returned to the PACU in stable and satisfactory condition. There were no complications and he tolerated the procedure well.

## 2014-08-03 NOTE — Progress Notes (Signed)
Assisted Dr. Singer with right, ultrasound guided, interscalene  block. Side rails up, monitors on throughout procedure. See vital signs in flow sheet. Tolerated Procedure well. 

## 2014-08-06 ENCOUNTER — Encounter (HOSPITAL_BASED_OUTPATIENT_CLINIC_OR_DEPARTMENT_OTHER): Payer: Self-pay | Admitting: Orthopedic Surgery

## 2015-04-10 ENCOUNTER — Ambulatory Visit (HOSPITAL_COMMUNITY)
Admission: RE | Admit: 2015-04-10 | Discharge: 2015-04-10 | Disposition: A | Discharge status: Home / Self Care | Payer: BLUE CROSS/BLUE SHIELD | Source: Ambulatory Visit | Attending: Cardiology | Admitting: Cardiology

## 2015-04-10 ENCOUNTER — Other Ambulatory Visit: Payer: Self-pay | Admitting: Sports Medicine

## 2015-04-10 DIAGNOSIS — M25579 Pain in unspecified ankle and joints of unspecified foot: Secondary | ICD-10-CM

## 2015-04-10 DIAGNOSIS — M79604 Pain in right leg: Secondary | ICD-10-CM | POA: Insufficient documentation

## 2015-04-10 DIAGNOSIS — M7989 Other specified soft tissue disorders: Secondary | ICD-10-CM | POA: Diagnosis present

## 2015-04-10 DIAGNOSIS — M79605 Pain in left leg: Secondary | ICD-10-CM | POA: Insufficient documentation

## 2015-04-10 DIAGNOSIS — M25473 Effusion, unspecified ankle: Secondary | ICD-10-CM

## 2015-04-29 ENCOUNTER — Other Ambulatory Visit: Payer: Self-pay | Admitting: Orthopedic Surgery

## 2015-04-29 DIAGNOSIS — M549 Dorsalgia, unspecified: Secondary | ICD-10-CM

## 2015-05-03 ENCOUNTER — Ambulatory Visit
Admission: RE | Admit: 2015-05-03 | Discharge: 2015-05-03 | Disposition: A | Discharge status: Home / Self Care | Payer: BLUE CROSS/BLUE SHIELD | Source: Ambulatory Visit | Attending: Orthopedic Surgery | Admitting: Orthopedic Surgery

## 2015-05-03 DIAGNOSIS — M549 Dorsalgia, unspecified: Secondary | ICD-10-CM

## 2015-05-07 ENCOUNTER — Ambulatory Visit
Admission: RE | Admit: 2015-05-07 | Discharge: 2015-05-07 | Disposition: A | Discharge status: Home / Self Care | Payer: BLUE CROSS/BLUE SHIELD | Source: Ambulatory Visit | Attending: Orthopedic Surgery | Admitting: Orthopedic Surgery

## 2015-05-11 ENCOUNTER — Other Ambulatory Visit: Payer: BLUE CROSS/BLUE SHIELD

## 2015-06-09 DIAGNOSIS — K458 Other specified abdominal hernia without obstruction or gangrene: Secondary | ICD-10-CM | POA: Insufficient documentation

## 2015-06-09 DIAGNOSIS — F419 Anxiety disorder, unspecified: Secondary | ICD-10-CM | POA: Insufficient documentation

## 2015-06-09 DIAGNOSIS — M545 Low back pain: Secondary | ICD-10-CM | POA: Diagnosis present

## 2015-06-09 DIAGNOSIS — E785 Hyperlipidemia, unspecified: Secondary | ICD-10-CM | POA: Diagnosis not present

## 2015-06-09 DIAGNOSIS — I10 Essential (primary) hypertension: Secondary | ICD-10-CM | POA: Diagnosis not present

## 2015-06-09 DIAGNOSIS — G8929 Other chronic pain: Secondary | ICD-10-CM | POA: Insufficient documentation

## 2015-06-09 DIAGNOSIS — M1990 Unspecified osteoarthritis, unspecified site: Secondary | ICD-10-CM | POA: Diagnosis not present

## 2015-06-09 DIAGNOSIS — Z79899 Other long term (current) drug therapy: Secondary | ICD-10-CM | POA: Insufficient documentation

## 2015-06-09 DIAGNOSIS — Z7982 Long term (current) use of aspirin: Secondary | ICD-10-CM | POA: Diagnosis not present

## 2015-06-09 DIAGNOSIS — F329 Major depressive disorder, single episode, unspecified: Secondary | ICD-10-CM | POA: Diagnosis not present

## 2015-06-09 DIAGNOSIS — Z87891 Personal history of nicotine dependence: Secondary | ICD-10-CM | POA: Insufficient documentation

## 2015-06-10 ENCOUNTER — Encounter (HOSPITAL_COMMUNITY): Payer: Self-pay | Admitting: *Deleted

## 2015-06-10 ENCOUNTER — Emergency Department (HOSPITAL_COMMUNITY)
Admission: EM | Admit: 2015-06-10 | Discharge: 2015-06-10 | Disposition: A | Discharge status: Home / Self Care | Payer: BLUE CROSS/BLUE SHIELD | Attending: Emergency Medicine | Admitting: Emergency Medicine

## 2015-06-10 DIAGNOSIS — M5126 Other intervertebral disc displacement, lumbar region: Secondary | ICD-10-CM

## 2015-06-10 MED ORDER — HYDROMORPHONE HCL 1 MG/ML IJ SOLN
2.0000 mg | Freq: Once | INTRAMUSCULAR | Status: AC
  Administered 2015-06-10: 2 mg via INTRAMUSCULAR
  Filled 2015-06-10: qty 2

## 2015-06-10 MED ORDER — TRAMADOL HCL 50 MG PO TABS: 50.0000 mg | ORAL_TABLET | Freq: Two times a day (BID) | ORAL | Status: DC | PRN

## 2015-06-10 MED ORDER — KETOROLAC TROMETHAMINE 60 MG/2ML IM SOLN
60.0000 mg | Freq: Once | INTRAMUSCULAR | Status: AC
  Administered 2015-06-10: 60 mg via INTRAMUSCULAR
  Filled 2015-06-10: qty 2

## 2015-06-10 NOTE — ED Notes (Signed)
The pt is c/o back pain for the past 3-4 days.  He has chronic back pain and the pain is getting worse.  He has had advil  2100 tonight.  He needs something for pain.  He also took a robaxin tablet that has not helped

## 2015-06-10 NOTE — ED Provider Notes (Addendum)
CSN: [id number]     Arrival date & time 06/09/15  2358 History  By signing my name below, I, Adam Harvey, attest that this documentation has been prepared under the direction and in the presence of Tomasita CrumbleAdeleke Saraiyah Hemminger, MD . Electronically Signed: Freida Busmaniana Harvey, Scribe. 06/10/2015. 12:59 AM.    Chief Complaint  Patient presents with  . Back Pain   The history is provided by the patient. No language interpreter was used.     HPI Comments:  Lynda RainwaterHunter Marcos is a 54 y.o. male with a history of bulging discs, who presents to the Emergency Department complaining of 10/10  back pain that has progressively worsened over the last month. His pain radiates into his RLE. He has taken tylenol PM at 2100 and advil throughout the day without relief. He denies urinary/bowel incontinece, numbness in his BLE, and recent trauma.   [facility]  Past Medical History  Diagnosis Date  . Hyperlipidemia   . Hypertension   . Obsessive compulsive disorder   . Osteoarthritis   . Depression   . Arthritis   . Alcoholism (HCC)     sober since 01-2011   . Hemorrhoids   . Anxiety   . Complication of anesthesia     sore throat  . Wears contact lenses   . Tear of right rotator cuff 08/03/2014   Past Surgical History  Procedure Laterality Date  . Achilles tendon repair  2008    right  . Knee surgery      x3-right  . Knee arthroscopy      left x4  . Tonsillectomy    . Inguinal hernia repair      right as infant  . Tympanostomy tube placement      as child  . Shoulder arthroscopy  2011    left  . Abcess drainage  2012    nasal cyst  . Arthoscopic rotaor cuff repair Right 08/03/2014    Procedure: ARTHROSCOPIC ROTATOR CUFF REPAIR;  Surgeon: Eulas PostJoshua P Landau, MD;  Location: [facility];  Service: Orthopedics;  Laterality: Right;   Family History  Problem Relation Age of Onset  . Colon cancer Neg Hx    Social History  Substance Use Topics  . Smoking status: Former Smoker    Types:  E-cigarettes    Quit date: 08/01/2010  . Smokeless tobacco: Never Used  . Alcohol Use: No     Comment: not since 2012    Review of Systems  10 systems reviewed and all are negative for acute change except as noted in the HPI.   Allergies  Review of patient's allergies indicates no known allergies.  Home Medications   Prior to Admission medications   Medication Sig Start Date End Date Taking? Authorizing Provider  aspirin 81 MG tablet Take 81 mg by mouth daily.      Historical Provider, MD  baclofen (LIORESAL) 10 MG tablet Take 1 tablet (10 mg total) by mouth 3 (three) times daily. As needed for muscle spasm 08/03/14   Teryl LucyJoshua Landau, MD  FLUoxetine (PROZAC) 20 MG tablet Take 20 mg by mouth daily.    Historical Provider, MD  HYDROmorphone (DILAUDID) 2 MG tablet Take 1 tablet (2 mg total) by mouth every 4 (four) hours as needed for severe pain. 08/03/14   Teryl LucyJoshua Landau, MD  lisinopril-hydrochlorothiazide (PRINZIDE,ZESTORETIC) 20-12.5 MG per tablet Take 1 tablet by mouth daily.      Historical Provider, MD  Multiple Vitamins-Minerals (MULTIVITAMIN WITH MINERALS) tablet Take 1  tablet by mouth daily.    Historical Provider, MD  ondansetron (ZOFRAN) 4 MG tablet Take 1 tablet (4 mg total) by mouth every 8 (eight) hours as needed for nausea or vomiting. 08/03/14   Teryl Lucy, MD  oxyCODONE-acetaminophen (PERCOCET) 10-325 MG per tablet Take 1-2 tablets by mouth every 6 (six) hours as needed for pain. MAXIMUM TOTAL ACETAMINOPHEN DOSE IS 4000 MG PER DAY 08/03/14   Teryl Lucy, MD  propranolol (INDERAL) 40 MG tablet Take 60 mg by mouth daily.      Historical Provider, MD  sennosides-docusate sodium (SENOKOT-S) 8.6-50 MG tablet Take 2 tablets by mouth daily. 08/03/14   Teryl Lucy, MD  simvastatin (ZOCOR) 40 MG tablet Take 40 mg by mouth at bedtime.      Historical Provider, MD   BP 149/97 mmHg  Pulse 82  Temp(Src) 97.6 F (36.4 C) (Oral)  Resp 18  SpO2 96% Physical Exam   Constitutional: He is oriented to person, place, and time. Vital signs are normal. He appears well-developed and well-nourished.  Non-toxic appearance. He does not appear ill. No distress.  HENT:  Head: Normocephalic and atraumatic.  Nose: Nose normal.  Mouth/Throat: Oropharynx is clear and moist. No oropharyngeal exudate.  Eyes: Conjunctivae and EOM are normal. Pupils are equal, round, and reactive to light. No scleral icterus.  Neck: Normal range of motion. Neck supple. No tracheal deviation, no edema, no erythema and normal range of motion present. No thyroid mass and no thyromegaly present.  Cardiovascular: Normal rate, regular rhythm, S1 normal, S2 normal, normal heart sounds, intact distal pulses and normal pulses.  Exam reveals no gallop and no friction rub.   No murmur heard. Pulses:      Radial pulses are 2+ on the right side, and 2+ on the left side.       Dorsalis pedis pulses are 2+ on the right side, and 2+ on the left side.  Pulmonary/Chest: Effort normal and breath sounds normal. No respiratory distress. He has no wheezes. He has no rhonchi. He has no rales.  Abdominal: Soft. Normal appearance and bowel sounds are normal. He exhibits no distension, no ascites and no mass. There is no hepatosplenomegaly. There is no tenderness. There is no rebound, no guarding and no CVA tenderness.  Musculoskeletal: Normal range of motion. He exhibits no edema or tenderness.  Positive straight leg raise on the right Normal strength and sensation to BLE  Lymphadenopathy:    He has no cervical adenopathy.  Neurological: He is alert and oriented to person, place, and time. He has normal strength. No cranial nerve deficit or sensory deficit.  Skin: Skin is warm, dry and intact. No petechiae and no rash noted. He is not diaphoretic. No erythema. No pallor.  Psychiatric: He has a normal mood and affect. His behavior is normal. Judgment normal.  Nursing note and vitals reviewed.   ED Course   Procedures   DIAGNOSTIC STUDIES:  Oxygen Saturation is 96% on RA, normal by my interpretation.    COORDINATION OF CARE:  12:35 AM Will order pain meds. Discussed treatment plan with pt at bedside and pt agreed to plan.  Labs Review Labs Reviewed - No data to display  Imaging Review No results found.   EKG Interpretation None      MDM   Final diagnoses:  None   Patient presents to emergency department for back pain. He has had chronic back pain and he recently had an MRI showing disc herniation. No red flag symptoms  such as incontinence, lower extremity numbness or weakness, cancer, IV drug use, weight loss or night sweats. This was advised by orthopedic surgery to come to the emergency department for pain control. He was given IM Dilaudid and Toradol for pain relief. He has a follow-up appointment to discuss possible injections for treatment. He appears well and in no acute distress, vital signs were within his normal limits and he is safe for discharge.     I, Havanah Nelms, personally performed the services described in this documentation. All medical record entries made by the scribe were at my direction and in my presence.  I have reviewed the chart and discharge instructions and agree that the record reflects my personal performance and is accurate and complete. Toniesha Zellner.  06/10/2015. 1:23 AM.       Tomasita Crumble, MD 06/10/15 0200

## 2015-06-10 NOTE — Discharge Instructions (Signed)
Herniated Disk Adam Harvey, continue to take ibuprofen 600 mg every 4-6 hours as needed for your back pain. If her pain becomes severe, take 1 tramadol. See. Dr. Madelon Lipsaffrey within 3 days for close follow-up. If any symptoms worsen come back to the emergency department immediately. Thank you. A herniated disk occurs when a disk in your spine bulges out too far. Your spine (backbone) is made up of bones called vertebrae. A disk with a spongy center is located between each pair of bones. These disks act as shock absorbers when you move. A herniated disk can cause pain and muscle weakness.  HOME CARE  Take all medicines as told by your doctor.  Rest for 2 days and then start moving.  Do not sit or stand for long periods of time.  Maintain good posture when sitting and standing.  Avoid moving in a way that causes pain, such as bending or lifting.  When you are able to start lifting things again:  [place] with your knees.  Keep your back straight.  Hold heavy objects close to your body.  If you are overweight, ask your doctor about starting a weight-loss program.  When you are able to start exercising, ask your doctor how much and what type of exercise is best for you.  Work with a physical therapist on stretching and strengthening exercises for your back.  Do not wear high-heeled shoes.  Do not sleep on your belly.  Do not smoke.  Keep all follow-up visits as told by your doctor. GET HELP IF:  You have back or neck pain that is not getting better after 4 weeks.  You have very bad pain in your back or neck.  You have a loss of feeling (numbness), tingling, or weakness along with pain. GET HELP RIGHT AWAY IF:  You have tingling, weakness, or loss of feeling that makes you unable to use your arms or legs.  You are not able to control when you pee (urinate) or poop (bowel movement).  You have dizziness or fainting.  You have shortness of breath. MAKE SURE YOU:  Understand these  instructions.  Will watch your condition.  Will get help right away if you are not doing well or get worse.   This information is not intended to replace advice given to you by your health care provider. Make sure you discuss any questions you have with your health care provider.   Document Released: 12/18/2013 Document Reviewed: 12/18/2013 Elsevier Interactive Patient Education [employer]2016 Elsevier Inc.

## 2015-07-26 ENCOUNTER — Other Ambulatory Visit: Payer: Self-pay | Admitting: Neurosurgery

## 2015-07-26 DIAGNOSIS — M4726 Other spondylosis with radiculopathy, lumbar region: Secondary | ICD-10-CM

## 2015-07-30 ENCOUNTER — Ambulatory Visit
Admission: RE | Admit: 2015-07-30 | Discharge: 2015-07-30 | Disposition: A | Discharge status: Home / Self Care | Payer: BLUE CROSS/BLUE SHIELD | Source: Ambulatory Visit | Attending: Neurosurgery | Admitting: Neurosurgery

## 2015-07-30 ENCOUNTER — Other Ambulatory Visit: Payer: Self-pay | Admitting: Neurosurgery

## 2015-07-30 DIAGNOSIS — M4726 Other spondylosis with radiculopathy, lumbar region: Secondary | ICD-10-CM

## 2015-07-30 MED ORDER — METHYLPREDNISOLONE ACETATE 40 MG/ML INJ SUSP (RADIOLOG
120.0000 mg | Freq: Once | INTRAMUSCULAR | Status: AC
  Administered 2015-07-30: 120 mg via EPIDURAL

## 2015-07-30 MED ORDER — IOHEXOL 180 MG/ML  SOLN
1.0000 mL | Freq: Once | INTRAMUSCULAR | Status: AC | PRN
  Administered 2015-07-30: 1 mL via EPIDURAL

## 2015-07-30 NOTE — Discharge Instructions (Signed)

## 2015-11-18 DIAGNOSIS — F411 Generalized anxiety disorder: Secondary | ICD-10-CM | POA: Diagnosis not present

## 2015-11-20 DIAGNOSIS — F419 Anxiety disorder, unspecified: Secondary | ICD-10-CM | POA: Diagnosis not present

## 2015-11-22 DIAGNOSIS — F411 Generalized anxiety disorder: Secondary | ICD-10-CM | POA: Diagnosis not present

## 2015-11-26 DIAGNOSIS — F411 Generalized anxiety disorder: Secondary | ICD-10-CM | POA: Diagnosis not present

## 2015-12-02 DIAGNOSIS — F411 Generalized anxiety disorder: Secondary | ICD-10-CM | POA: Diagnosis not present

## 2015-12-09 DIAGNOSIS — F411 Generalized anxiety disorder: Secondary | ICD-10-CM | POA: Diagnosis not present

## 2015-12-11 DIAGNOSIS — F419 Anxiety disorder, unspecified: Secondary | ICD-10-CM | POA: Diagnosis not present

## 2015-12-23 DIAGNOSIS — F411 Generalized anxiety disorder: Secondary | ICD-10-CM | POA: Diagnosis not present

## 2016-01-15 DIAGNOSIS — F411 Generalized anxiety disorder: Secondary | ICD-10-CM | POA: Diagnosis not present

## 2016-01-30 DIAGNOSIS — M5116 Intervertebral disc disorders with radiculopathy, lumbar region: Secondary | ICD-10-CM | POA: Diagnosis not present

## 2016-02-05 DIAGNOSIS — R42 Dizziness and giddiness: Secondary | ICD-10-CM | POA: Diagnosis not present

## 2016-02-05 DIAGNOSIS — R946 Abnormal results of thyroid function studies: Secondary | ICD-10-CM | POA: Diagnosis not present

## 2016-02-05 DIAGNOSIS — I10 Essential (primary) hypertension: Secondary | ICD-10-CM | POA: Diagnosis not present

## 2016-02-05 DIAGNOSIS — F329 Major depressive disorder, single episode, unspecified: Secondary | ICD-10-CM | POA: Diagnosis not present

## 2016-02-12 DIAGNOSIS — F411 Generalized anxiety disorder: Secondary | ICD-10-CM | POA: Diagnosis not present

## 2016-03-04 DIAGNOSIS — Z0000 Encounter for general adult medical examination without abnormal findings: Secondary | ICD-10-CM | POA: Diagnosis not present

## 2016-03-04 DIAGNOSIS — I10 Essential (primary) hypertension: Secondary | ICD-10-CM | POA: Diagnosis not present

## 2016-03-04 DIAGNOSIS — R946 Abnormal results of thyroid function studies: Secondary | ICD-10-CM | POA: Diagnosis not present

## 2016-03-04 DIAGNOSIS — E784 Other hyperlipidemia: Secondary | ICD-10-CM | POA: Diagnosis not present

## 2016-03-04 DIAGNOSIS — Z125 Encounter for screening for malignant neoplasm of prostate: Secondary | ICD-10-CM | POA: Diagnosis not present

## 2016-03-04 DIAGNOSIS — R7301 Impaired fasting glucose: Secondary | ICD-10-CM | POA: Diagnosis not present

## 2016-03-11 DIAGNOSIS — F329 Major depressive disorder, single episode, unspecified: Secondary | ICD-10-CM | POA: Diagnosis not present

## 2016-03-11 DIAGNOSIS — M1990 Unspecified osteoarthritis, unspecified site: Secondary | ICD-10-CM | POA: Diagnosis not present

## 2016-03-11 DIAGNOSIS — E784 Other hyperlipidemia: Secondary | ICD-10-CM | POA: Diagnosis not present

## 2016-03-11 DIAGNOSIS — Z6831 Body mass index (BMI) 31.0-31.9, adult: Secondary | ICD-10-CM | POA: Diagnosis not present

## 2016-03-11 DIAGNOSIS — I10 Essential (primary) hypertension: Secondary | ICD-10-CM | POA: Diagnosis not present

## 2016-03-11 DIAGNOSIS — Z0000 Encounter for general adult medical examination without abnormal findings: Secondary | ICD-10-CM | POA: Diagnosis not present

## 2016-03-13 DIAGNOSIS — Z1212 Encounter for screening for malignant neoplasm of rectum: Secondary | ICD-10-CM | POA: Diagnosis not present

## 2016-03-27 DIAGNOSIS — F411 Generalized anxiety disorder: Secondary | ICD-10-CM | POA: Diagnosis not present

## 2016-04-24 DIAGNOSIS — Z23 Encounter for immunization: Secondary | ICD-10-CM | POA: Diagnosis not present

## 2016-07-15 DIAGNOSIS — F411 Generalized anxiety disorder: Secondary | ICD-10-CM | POA: Diagnosis not present

## 2016-08-20 DIAGNOSIS — J0100 Acute maxillary sinusitis, unspecified: Secondary | ICD-10-CM | POA: Diagnosis not present

## 2016-08-20 DIAGNOSIS — I10 Essential (primary) hypertension: Secondary | ICD-10-CM | POA: Diagnosis not present

## 2016-08-20 DIAGNOSIS — Z6831 Body mass index (BMI) 31.0-31.9, adult: Secondary | ICD-10-CM | POA: Diagnosis not present

## 2016-08-20 DIAGNOSIS — R05 Cough: Secondary | ICD-10-CM | POA: Diagnosis not present

## 2016-08-24 DIAGNOSIS — Z7289 Other problems related to lifestyle: Secondary | ICD-10-CM | POA: Diagnosis not present

## 2016-10-12 DIAGNOSIS — M545 Low back pain: Secondary | ICD-10-CM | POA: Diagnosis not present

## 2016-10-12 DIAGNOSIS — Z6831 Body mass index (BMI) 31.0-31.9, adult: Secondary | ICD-10-CM | POA: Diagnosis not present

## 2016-10-12 DIAGNOSIS — J329 Chronic sinusitis, unspecified: Secondary | ICD-10-CM | POA: Diagnosis not present

## 2016-10-12 DIAGNOSIS — R7301 Impaired fasting glucose: Secondary | ICD-10-CM | POA: Diagnosis not present

## 2016-10-12 DIAGNOSIS — R946 Abnormal results of thyroid function studies: Secondary | ICD-10-CM | POA: Diagnosis not present

## 2016-10-12 DIAGNOSIS — Z1389 Encounter for screening for other disorder: Secondary | ICD-10-CM | POA: Diagnosis not present

## 2016-10-28 DIAGNOSIS — F411 Generalized anxiety disorder: Secondary | ICD-10-CM | POA: Diagnosis not present

## 2016-11-04 DIAGNOSIS — G8929 Other chronic pain: Secondary | ICD-10-CM | POA: Diagnosis not present

## 2016-11-04 DIAGNOSIS — M545 Low back pain: Secondary | ICD-10-CM | POA: Diagnosis not present

## 2016-11-04 DIAGNOSIS — M62830 Muscle spasm of back: Secondary | ICD-10-CM | POA: Diagnosis not present

## 2016-12-08 DIAGNOSIS — M25561 Pain in right knee: Secondary | ICD-10-CM | POA: Diagnosis not present

## 2017-02-24 DIAGNOSIS — F411 Generalized anxiety disorder: Secondary | ICD-10-CM | POA: Diagnosis not present

## 2017-03-26 DIAGNOSIS — I10 Essential (primary) hypertension: Secondary | ICD-10-CM | POA: Diagnosis not present

## 2017-03-26 DIAGNOSIS — Z125 Encounter for screening for malignant neoplasm of prostate: Secondary | ICD-10-CM | POA: Diagnosis not present

## 2017-03-26 DIAGNOSIS — R946 Abnormal results of thyroid function studies: Secondary | ICD-10-CM | POA: Diagnosis not present

## 2017-03-26 DIAGNOSIS — Z0000 Encounter for general adult medical examination without abnormal findings: Secondary | ICD-10-CM | POA: Diagnosis not present

## 2017-03-26 DIAGNOSIS — R7301 Impaired fasting glucose: Secondary | ICD-10-CM | POA: Diagnosis not present

## 2017-04-01 DIAGNOSIS — E669 Obesity, unspecified: Secondary | ICD-10-CM | POA: Diagnosis not present

## 2017-04-01 DIAGNOSIS — I10 Essential (primary) hypertension: Secondary | ICD-10-CM | POA: Diagnosis not present

## 2017-04-01 DIAGNOSIS — E784 Other hyperlipidemia: Secondary | ICD-10-CM | POA: Diagnosis not present

## 2017-04-01 DIAGNOSIS — Z0000 Encounter for general adult medical examination without abnormal findings: Secondary | ICD-10-CM | POA: Diagnosis not present

## 2017-04-01 DIAGNOSIS — M1990 Unspecified osteoarthritis, unspecified site: Secondary | ICD-10-CM | POA: Diagnosis not present

## 2017-04-02 DIAGNOSIS — Z1212 Encounter for screening for malignant neoplasm of rectum: Secondary | ICD-10-CM | POA: Diagnosis not present

## 2017-04-09 DIAGNOSIS — M25561 Pain in right knee: Secondary | ICD-10-CM | POA: Diagnosis not present

## 2017-04-12 DIAGNOSIS — M25561 Pain in right knee: Secondary | ICD-10-CM | POA: Diagnosis not present

## 2017-04-15 DIAGNOSIS — M170 Bilateral primary osteoarthritis of knee: Secondary | ICD-10-CM | POA: Diagnosis not present

## 2017-04-22 DIAGNOSIS — M170 Bilateral primary osteoarthritis of knee: Secondary | ICD-10-CM | POA: Diagnosis not present

## 2017-04-29 DIAGNOSIS — M170 Bilateral primary osteoarthritis of knee: Secondary | ICD-10-CM | POA: Diagnosis not present

## 2017-05-03 DIAGNOSIS — M1712 Unilateral primary osteoarthritis, left knee: Secondary | ICD-10-CM | POA: Diagnosis not present

## 2017-07-27 DIAGNOSIS — M170 Bilateral primary osteoarthritis of knee: Secondary | ICD-10-CM | POA: Diagnosis not present

## 2017-08-03 ENCOUNTER — Ambulatory Visit: Payer: Self-pay | Admitting: Physician Assistant

## 2017-08-03 NOTE — H&P (Signed)
TOTAL KNEE ADMISSION H&P  Patient is being admitted for right total knee arthroplasty.  Subjective:  Chief Complaint:right knee pain.  HPI: Adam Harvey, 56 y.o. male, has a history of pain and functional disability in the right knee due to arthritis and has failed non-surgical conservative treatments for greater than 12 weeks to includeNSAID's and/or analgesics, corticosteriod injections, viscosupplementation injections and activity modification.  Onset of symptoms was gradual, starting >10 years ago with gradually worsening course since that time. The patient noted prior procedures on the knee to include  arthroscopy, menisectomy and I believe had patellar realignment  on the right knee(s).  Patient currently rates pain in the right knee(s) at 9 out of 10 with activity. Patient has night pain, worsening of pain with activity and weight bearing, pain that interferes with activities of daily living, pain with passive range of motion, crepitus and joint swelling.  Patient has evidence of periarticular osteophytes and joint space narrowing by imaging studies. There is no active infection.  Patient Active Problem List   Diagnosis Date Noted  . Tear of right rotator cuff 08/03/2014   Past Medical History:  Diagnosis Date  . Alcoholism (HCC)    sober since 01-2011   . Anxiety   . Arthritis   . Complication of anesthesia    sore throat  . Depression   . Hemorrhoids   . Hyperlipidemia   . Hypertension   . Obsessive compulsive disorder   . Osteoarthritis   . Tear of right rotator cuff 08/03/2014  . Wears contact lenses     Past Surgical History:  Procedure Laterality Date  . ABCESS DRAINAGE  2012   nasal cyst  . ACHILLES TENDON REPAIR  2008   right  . ARTHOSCOPIC ROTAOR CUFF REPAIR Right 08/03/2014   Procedure: ARTHROSCOPIC ROTATOR CUFF REPAIR;  Surgeon: Eulas PostJoshua P Landau, MD;  Location: [facility];  Service: Orthopedics;  Laterality: Right;  . INGUINAL HERNIA REPAIR      right as infant  . KNEE ARTHROSCOPY     left x4  . KNEE SURGERY     x3-right  . SHOULDER ARTHROSCOPY  2011   left  . TONSILLECTOMY    . TYMPANOSTOMY TUBE PLACEMENT     as child    Current Outpatient Medications  Medication Sig Dispense Refill Last Dose  . acetaminophen (TYLENOL) 650 MG CR tablet Take 1,300 mg by mouth every 8 (eight) hours as needed for pain.     Marland Kitchen. aspirin 81 MG tablet Take 81 mg by mouth daily.     08/03/2014 at Unknown time  . baclofen (LIORESAL) 10 MG tablet Take 1 tablet (10 mg total) by mouth 3 (three) times daily. As needed for muscle spasm (Patient not taking: Reported on 08/03/2017) 50 tablet 0 Not Taking at Unknown time  . HYDROmorphone (DILAUDID) 2 MG tablet Take 1 tablet (2 mg total) by mouth every 4 (four) hours as needed for severe pain. (Patient not taking: Reported on 08/03/2017) 50 tablet 0 Not Taking at Unknown time  . lamoTRIgine (LAMICTAL) 150 MG tablet Take 150 mg by mouth at bedtime.     Marland Kitchen. lisinopril-hydrochlorothiazide (PRINZIDE,ZESTORETIC) 20-12.5 MG per tablet Take 1 tablet by mouth daily.     08/03/2014 at Unknown time  . Melatonin 10 MG TABS Take by mouth.     . meloxicam (MOBIC) 15 MG tablet Take 15 mg by mouth daily.     . methocarbamol (ROBAXIN) 500 MG tablet Take 500 mg by mouth daily as  needed for muscle spasms.   0   . Multiple Vitamins-Minerals (MULTIVITAMIN WITH MINERALS) tablet Take 1 tablet by mouth daily.   08/02/2014 at Unknown time  . omeprazole (PRILOSEC OTC) 20 MG tablet Take 20 mg by mouth daily.     . ondansetron (ZOFRAN) 4 MG tablet Take 1 tablet (4 mg total) by mouth every 8 (eight) hours as needed for nausea or vomiting. (Patient not taking: Reported on 08/03/2017) 30 tablet 0 Not Taking at Unknown time  . oxyCODONE-acetaminophen (PERCOCET) 10-325 MG per tablet Take 1-2 tablets by mouth every 6 (six) hours as needed for pain. MAXIMUM TOTAL ACETAMINOPHEN DOSE IS 4000 MG PER DAY (Patient not taking: Reported on 08/03/2017) 75  tablet 0 Not Taking at Unknown time  . propranolol (INDERAL) 40 MG tablet Take 40 mg by mouth daily.    08/03/2014 at Unknown time  . sennosides-docusate sodium (SENOKOT-S) 8.6-50 MG tablet Take 2 tablets by mouth daily. (Patient not taking: Reported on 08/03/2017) 30 tablet 1 Not Taking at Unknown time  . sertraline (ZOLOFT) 100 MG tablet Take 200 mg by mouth daily.     . simvastatin (ZOCOR) 40 MG tablet Take 40 mg by mouth daily.    08/03/2014 at Unknown time  . traMADol (ULTRAM) 50 MG tablet Take 1 tablet (50 mg total) by mouth every 12 (twelve) hours as needed for severe pain. (Patient not taking: Reported on 08/03/2017) 8 tablet 0 Not Taking at Unknown time   No current facility-administered medications for this visit.    No Known Allergies  Social History   Tobacco Use  . Smoking status: Former Smoker    Types: E-cigarettes    Last attempt to quit: 08/01/2010    Years since quitting: 7.0  . Smokeless tobacco: Never Used  Substance Use Topics  . Alcohol use: No    Comment: not since 2012    Family History  Problem Relation Age of Onset  . Colon cancer Neg Hx      Review of Systems  Musculoskeletal: Positive for joint pain.  All other systems reviewed and are negative.   Objective:  Physical Exam  Constitutional: He is oriented to person, place, and time. He appears well-developed and well-nourished. No distress.  HENT:  Head: Normocephalic and atraumatic.  Nose: Nose normal.  Eyes: Conjunctivae and EOM are normal. Pupils are equal, round, and reactive to light.  Neck: Normal range of motion. Neck supple.  Cardiovascular: Normal rate, regular rhythm, normal heart sounds and intact distal pulses.  Respiratory: Effort normal and breath sounds normal. No respiratory distress. He has no wheezes.  GI: Soft. Bowel sounds are normal. He exhibits no distension. There is no tenderness.  Musculoskeletal:       Right knee: He exhibits swelling. He exhibits normal range of motion  and no effusion. Tenderness found. Medial joint line and lateral joint line tenderness noted.  Lymphadenopathy:    He has no cervical adenopathy.  Neurological: He is alert and oriented to person, place, and time. No cranial nerve deficit.  Skin: Skin is warm and dry. No rash noted. No erythema.  Psychiatric: He has a normal mood and affect. His behavior is normal.    Vital signs in last 24 hours: @VSRANGES @  Labs:   Estimated body mass index is 35.8 kg/m as calculated from the following:   Height as of 08/03/14: 6' (1.829 m).   Weight as of 08/03/14: 119.7 kg (264 lb).   Imaging Review Plain radiographs demonstrate moderate degenerative joint  disease of the right knee(s). The overall alignment ismild valgus. The bone quality appears to be good for age and reported activity level.  Assessment/Plan:  End stage arthritis, right knee   The patient history, physical examination, clinical judgment of the provider and imaging studies are consistent with end stage degenerative joint disease of the right knee(s) and total knee arthroplasty is deemed medically necessary. The treatment options including medical management, injection therapy arthroscopy and arthroplasty were discussed at length. The risks and benefits of total knee arthroplasty were presented and reviewed. The risks due to aseptic loosening, infection, stiffness, patella tracking problems, thromboembolic complications and other imponderables were discussed. The patient acknowledged the explanation, agreed to proceed with the plan and consent was signed. Patient is being admitted for inpatient treatment for surgery, pain control, PT, OT, prophylactic antibiotics, VTE prophylaxis, progressive ambulation and ADL's and discharge planning. The patient is planning to be discharged home with home health services

## 2017-08-03 NOTE — H&P (View-Only) (Signed)
TOTAL KNEE ADMISSION H&P  Patient is being admitted for right total knee arthroplasty.  Subjective:  Chief Complaint:right knee pain.  HPI: Adam Harvey, 56 y.o. male, has a history of pain and functional disability in the right knee due to arthritis and has failed non-surgical conservative treatments for greater than 12 weeks to includeNSAID's and/or analgesics, corticosteriod injections, viscosupplementation injections and activity modification.  Onset of symptoms was gradual, starting >10 years ago with gradually worsening course since that time. The patient noted prior procedures on the knee to include  arthroscopy, menisectomy and I believe had patellar realignment  on the right knee(s).  Patient currently rates pain in the right knee(s) at 9 out of 10 with activity. Patient has night pain, worsening of pain with activity and weight bearing, pain that interferes with activities of daily living, pain with passive range of motion, crepitus and joint swelling.  Patient has evidence of periarticular osteophytes and joint space narrowing by imaging studies. There is no active infection.  Patient Active Problem List   Diagnosis Date Noted  . Tear of right rotator cuff 08/03/2014   Past Medical History:  Diagnosis Date  . Alcoholism (HCC)    sober since 01-2011   . Anxiety   . Arthritis   . Complication of anesthesia    sore throat  . Depression   . Hemorrhoids   . Hyperlipidemia   . Hypertension   . Obsessive compulsive disorder   . Osteoarthritis   . Tear of right rotator cuff 08/03/2014  . Wears contact lenses     Past Surgical History:  Procedure Laterality Date  . ABCESS DRAINAGE  2012   nasal cyst  . ACHILLES TENDON REPAIR  2008   right  . ARTHOSCOPIC ROTAOR CUFF REPAIR Right 08/03/2014   Procedure: ARTHROSCOPIC ROTATOR CUFF REPAIR;  Surgeon: Gerturde Kuba P Landau, MD;  Location: [facility];  Service: Orthopedics;  Laterality: Right;  . INGUINAL HERNIA REPAIR      right as infant  . KNEE ARTHROSCOPY     left x4  . KNEE SURGERY     x3-right  . SHOULDER ARTHROSCOPY  2011   left  . TONSILLECTOMY    . TYMPANOSTOMY TUBE PLACEMENT     as child    Current Outpatient Medications  Medication Sig Dispense Refill Last Dose  . acetaminophen (TYLENOL) 650 MG CR tablet Take 1,300 mg by mouth every 8 (eight) hours as needed for pain.     . aspirin 81 MG tablet Take 81 mg by mouth daily.     08/03/2014 at Unknown time  . baclofen (LIORESAL) 10 MG tablet Take 1 tablet (10 mg total) by mouth 3 (three) times daily. As needed for muscle spasm (Patient not taking: Reported on 08/03/2017) 50 tablet 0 Not Taking at Unknown time  . HYDROmorphone (DILAUDID) 2 MG tablet Take 1 tablet (2 mg total) by mouth every 4 (four) hours as needed for severe pain. (Patient not taking: Reported on 08/03/2017) 50 tablet 0 Not Taking at Unknown time  . lamoTRIgine (LAMICTAL) 150 MG tablet Take 150 mg by mouth at bedtime.     . lisinopril-hydrochlorothiazide (PRINZIDE,ZESTORETIC) 20-12.5 MG per tablet Take 1 tablet by mouth daily.     08/03/2014 at Unknown time  . Melatonin 10 MG TABS Take by mouth.     . meloxicam (MOBIC) 15 MG tablet Take 15 mg by mouth daily.     . methocarbamol (ROBAXIN) 500 MG tablet Take 500 mg by mouth daily as   needed for muscle spasms.   0   . Multiple Vitamins-Minerals (MULTIVITAMIN WITH MINERALS) tablet Take 1 tablet by mouth daily.   08/02/2014 at Unknown time  . omeprazole (PRILOSEC OTC) 20 MG tablet Take 20 mg by mouth daily.     . ondansetron (ZOFRAN) 4 MG tablet Take 1 tablet (4 mg total) by mouth every 8 (eight) hours as needed for nausea or vomiting. (Patient not taking: Reported on 08/03/2017) 30 tablet 0 Not Taking at Unknown time  . oxyCODONE-acetaminophen (PERCOCET) 10-325 MG per tablet Take 1-2 tablets by mouth every 6 (six) hours as needed for pain. MAXIMUM TOTAL ACETAMINOPHEN DOSE IS 4000 MG PER DAY (Patient not taking: Reported on 08/03/2017) 75  tablet 0 Not Taking at Unknown time  . propranolol (INDERAL) 40 MG tablet Take 40 mg by mouth daily.    08/03/2014 at Unknown time  . sennosides-docusate sodium (SENOKOT-S) 8.6-50 MG tablet Take 2 tablets by mouth daily. (Patient not taking: Reported on 08/03/2017) 30 tablet 1 Not Taking at Unknown time  . sertraline (ZOLOFT) 100 MG tablet Take 200 mg by mouth daily.     . simvastatin (ZOCOR) 40 MG tablet Take 40 mg by mouth daily.    08/03/2014 at Unknown time  . traMADol (ULTRAM) 50 MG tablet Take 1 tablet (50 mg total) by mouth every 12 (twelve) hours as needed for severe pain. (Patient not taking: Reported on 08/03/2017) 8 tablet 0 Not Taking at Unknown time   No current facility-administered medications for this visit.    No Known Allergies  Social History   Tobacco Use  . Smoking status: Former Smoker    Types: E-cigarettes    Last attempt to quit: 08/01/2010    Years since quitting: 7.0  . Smokeless tobacco: Never Used  Substance Use Topics  . Alcohol use: No    Comment: not since 2012    Family History  Problem Relation Age of Onset  . Colon cancer Neg Hx      Review of Systems  Musculoskeletal: Positive for joint pain.  All other systems reviewed and are negative.   Objective:  Physical Exam  Constitutional: He is oriented to person, place, and time. He appears well-developed and well-nourished. No distress.  HENT:  Head: Normocephalic and atraumatic.  Nose: Nose normal.  Eyes: Conjunctivae and EOM are normal. Pupils are equal, round, and reactive to light.  Neck: Normal range of motion. Neck supple.  Cardiovascular: Normal rate, regular rhythm, normal heart sounds and intact distal pulses.  Respiratory: Effort normal and breath sounds normal. No respiratory distress. He has no wheezes.  GI: Soft. Bowel sounds are normal. He exhibits no distension. There is no tenderness.  Musculoskeletal:       Right knee: He exhibits swelling. He exhibits normal range of motion  and no effusion. Tenderness found. Medial joint line and lateral joint line tenderness noted.  Lymphadenopathy:    He has no cervical adenopathy.  Neurological: He is alert and oriented to person, place, and time. No cranial nerve deficit.  Skin: Skin is warm and dry. No rash noted. No erythema.  Psychiatric: He has a normal mood and affect. His behavior is normal.    Vital signs in last 24 hours: @VSRANGES @  Labs:   Estimated body mass index is 35.8 kg/m as calculated from the following:   Height as of 08/03/14: 6' (1.829 m).   Weight as of 08/03/14: 119.7 kg (264 lb).   Imaging Review Plain radiographs demonstrate moderate degenerative joint  disease of the right knee(s). The overall alignment ismild valgus. The bone quality appears to be good for age and reported activity level.  Assessment/Plan:  End stage arthritis, right knee   The patient history, physical examination, clinical judgment of the provider and imaging studies are consistent with end stage degenerative joint disease of the right knee(s) and total knee arthroplasty is deemed medically necessary. The treatment options including medical management, injection therapy arthroscopy and arthroplasty were discussed at length. The risks and benefits of total knee arthroplasty were presented and reviewed. The risks due to aseptic loosening, infection, stiffness, patella tracking problems, thromboembolic complications and other imponderables were discussed. The patient acknowledged the explanation, agreed to proceed with the plan and consent was signed. Patient is being admitted for inpatient treatment for surgery, pain control, PT, OT, prophylactic antibiotics, VTE prophylaxis, progressive ambulation and ADL's and discharge planning. The patient is planning to be discharged home with home health services

## 2017-08-09 ENCOUNTER — Other Ambulatory Visit (HOSPITAL_COMMUNITY): Payer: BLUE CROSS/BLUE SHIELD

## 2017-08-11 NOTE — Pre-Procedure Instructions (Signed)
Keeghan Thiam  08/11/2017      [facility] [address]- [place], [place] - [address] [place] [address] Phone: [phone] Fax: [phone]  [facility] - [place], [place] - [address]  [place] [id number] Phone: [phone] Fax: [phone]    Your procedure is scheduled on Fri. Jan 4  [facility] Ad[facility] at 5:30 A.M.  Call this number if you have problems the morning of surgery:  [phone]   Remember:  Do not eat food or drink liquids after midnight on Thurs. Jan. 3   Take these medicines the morning of surgery with A SIP OF WATER : tylenol if needed, methocarbamol (robaxin) if needed,omeprazole (prilosec), propranolol (inderal), sertraline (zoloft), tramadol if needed            7 days prior to surgery STOP taking any Aspirin(unless otherwise instructed by your surgeon), Aleve, Naproxen, Ibuprofen, Motrin, Advil, Goody's, BC's, all herbal medications, fish oil, and all vitamins   Do not wear jewelry.  Do not wear lotions, powders, or perfumes, or deodorant.  Do not shave 48 hours prior to surgery.  Men may shave face and neck.  Do not bring valuables to the hospital.  [facility] is not responsible for any belongings or valuables.  Contacts, dentures or bridgework may not be worn into surgery.  Leave your suitcase in the car.  After surgery it may be brought to your room.  For patients admitted to the hospital, discharge time will be determined by your treatment team.  Patients discharged the day of surgery will not be allowed to drive home.    Special instructions:  [facility]- Preparing For Surgery  Before surgery, you can play an important role. Because skin is not sterile, your skin needs to be as free of germs as possible. You can reduce the number of germs on your skin by washing with CHG (chlorahexidine gluconate) Soap before surgery.  CHG is an antiseptic  cleaner which kills germs and bonds with the skin to continue killing germs even after washing.  Please do not use if you have an allergy to CHG or antibacterial soaps. If your skin becomes reddened/irritated stop using the CHG.  Do not shave (including legs and underarms) for at least 48 hours prior to first CHG shower. It is OK to shave your face.  Please follow these instructions carefully.   1. Shower the NIGHT BEFORE SURGERY and the MORNING OF SURGERY with CHG.   2. If you chose to wash your hair, wash your hair first as usual with your normal shampoo.  3. After you shampoo, rinse your hair and body thoroughly to remove the shampoo.  4. Use CHG as you would any other liquid soap. You can apply CHG directly to the skin and wash gently with a scrungie or a clean washcloth.   5. Apply the CHG Soap to your body ONLY FROM THE NECK DOWN.  Do not use on open wounds or open sores. Avoid contact with your eyes, ears, mouth and genitals (private parts). Wash Face and genitals (private parts)  with your normal soap.  6. Wash thoroughly, paying special attention to the area where your surgery will be performed.  7. Thoroughly rinse your body with warm water from the neck down.  8. DO NOT shower/wash with your normal soap after using and rinsing off the CHG Soap.  9. Pat yourself dry with a CLEAN TOWEL.  10.  Wear CLEAN PAJAMAS to bed the night before surgery, wear comfortable clothes the morning of surgery  11. Place CLEAN SHEETS on your bed the night of your first shower and DO NOT SLEEP WITH PETS.    Day of Surgery: Do not apply any deodorants/lotions. Please wear clean clothes to the hospital/surgery center.      Please read over the following fact sheets that you were given. Coughing and Deep Breathing, MRSA Information and Surgical Site Infection Prevention

## 2017-08-12 ENCOUNTER — Other Ambulatory Visit: Payer: Self-pay

## 2017-08-12 ENCOUNTER — Encounter (HOSPITAL_COMMUNITY)
Admission: RE | Admit: 2017-08-12 | Discharge: 2017-08-12 | Disposition: A | Discharge status: Home / Self Care | Payer: BLUE CROSS/BLUE SHIELD | Source: Ambulatory Visit | Attending: Physician Assistant | Admitting: Physician Assistant

## 2017-08-12 ENCOUNTER — Encounter (HOSPITAL_COMMUNITY): Payer: Self-pay

## 2017-08-12 ENCOUNTER — Encounter (HOSPITAL_COMMUNITY)
Admission: RE | Admit: 2017-08-12 | Discharge: 2017-08-12 | Disposition: A | Discharge status: Home / Self Care | Payer: BLUE CROSS/BLUE SHIELD | Source: Ambulatory Visit | Attending: Orthopedic Surgery | Admitting: Orthopedic Surgery

## 2017-08-12 DIAGNOSIS — Z01818 Encounter for other preprocedural examination: Secondary | ICD-10-CM | POA: Diagnosis not present

## 2017-08-12 DIAGNOSIS — R001 Bradycardia, unspecified: Secondary | ICD-10-CM | POA: Diagnosis not present

## 2017-08-12 DIAGNOSIS — Z7982 Long term (current) use of aspirin: Secondary | ICD-10-CM | POA: Diagnosis not present

## 2017-08-12 DIAGNOSIS — M1711 Unilateral primary osteoarthritis, right knee: Secondary | ICD-10-CM | POA: Diagnosis not present

## 2017-08-12 DIAGNOSIS — Z79899 Other long term (current) drug therapy: Secondary | ICD-10-CM | POA: Insufficient documentation

## 2017-08-12 HISTORY — DX: Gastro-esophageal reflux disease without esophagitis: K21.9

## 2017-08-12 LAB — COMPREHENSIVE METABOLIC PANEL
ALT: 26 U/L (ref 17–63)
AST: 29 U/L (ref 15–41)
Albumin: 4.3 g/dL (ref 3.5–5.0)
Alkaline Phosphatase: 60 U/L (ref 38–126)
Anion gap: 10 (ref 5–15)
BUN: 24 mg/dL — ABNORMAL HIGH (ref 6–20)
CO2: 25 mmol/L (ref 22–32)
Calcium: 9.1 mg/dL (ref 8.9–10.3)
Chloride: 101 mmol/L (ref 101–111)
Creatinine, Ser: 0.96 mg/dL (ref 0.61–1.24)
GFR calc Af Amer: >60 mL/min (ref >60)
GFR calc non Af Amer: >60 mL/min (ref >60)
Glucose, Bld: 98 mg/dL (ref 65–99)
Potassium: 4.3 mmol/L (ref 3.5–5.1)
Sodium: 136 mmol/L (ref 135–145)
Total Bilirubin: 1 mg/dL (ref 0.3–1.2)
Total Protein: 7.6 g/dL (ref 6.5–8.1)

## 2017-08-12 LAB — CBC WITH DIFFERENTIAL/PLATELET
Basophils Absolute: 0 10*3/uL (ref 0.0–0.1)
Basophils Relative: 0 %
Eosinophils Absolute: 0.2 10*3/uL (ref 0.0–0.7)
Eosinophils Relative: 2 %
HCT: 41 % (ref 39.0–52.0)
Hemoglobin: 14.4 g/dL (ref 13.0–17.0)
Lymphocytes Relative: 29 %
Lymphs Abs: 2.1 10*3/uL (ref 0.7–4.0)
MCH: 31.7 pg (ref 26.0–34.0)
MCHC: 35.1 g/dL (ref 30.0–36.0)
MCV: 90.3 fL (ref 78.0–100.0)
Monocytes Absolute: 0.4 10*3/uL (ref 0.1–1.0)
Monocytes Relative: 6 %
Neutro Abs: 4.4 10*3/uL (ref 1.7–7.7)
Neutrophils Relative %: 63 %
Platelets: 180 10*3/uL (ref 150–400)
RBC: 4.54 MIL/uL (ref 4.22–5.81)
RDW: 13.4 % (ref 11.5–15.5)
WBC: 7.1 10*3/uL (ref 4.0–10.5)

## 2017-08-12 LAB — URINALYSIS, ROUTINE W REFLEX MICROSCOPIC
Bilirubin Urine: NEGATIVE
Glucose, UA: NEGATIVE mg/dL
Hgb urine dipstick: NEGATIVE
Ketones, ur: NEGATIVE mg/dL
Leukocytes, UA: NEGATIVE
Nitrite: NEGATIVE
Protein, ur: NEGATIVE mg/dL
Specific Gravity, Urine: 1.016 (ref 1.005–1.030)
pH: 5 (ref 5.0–8.0)

## 2017-08-12 LAB — PROTIME-INR
INR: 1.06
Prothrombin Time: 13.7 seconds (ref 11.4–15.2)

## 2017-08-12 LAB — APTT: aPTT: 29 seconds (ref 24–36)

## 2017-08-12 LAB — TYPE AND SCREEN
ABO/RH(D): O POS
Antibody Screen: NEGATIVE

## 2017-08-12 LAB — SURGICAL PCR SCREEN
MRSA, PCR: NEGATIVE
Staphylococcus aureus: NEGATIVE

## 2017-08-12 LAB — ABO/RH: ABO/RH(D): O POS

## 2017-08-12 NOTE — Progress Notes (Signed)
   08/12/17 1421  OBSTRUCTIVE SLEEP APNEA  Have you ever been diagnosed with sleep apnea through a sleep study? No  Do you snore loudly (loud enough to be heard through closed doors)?  0  Do you often feel tired, fatigued, or sleepy during the daytime (such as falling asleep during driving or talking to someone)? 0  Has anyone observed you stop breathing during your sleep? 0  Do you have, or are you being treated for high blood pressure? 1  BMI more than 35 kg/m2? 1  Age > 50 (1-yes) 1  Neck circumference greater than:Male 16 inches or larger, Male 17inches or larger? 1  Male Gender (Yes=1) 1  Obstructive Sleep Apnea Score 5  Score 5 or greater  Results sent to PCP

## 2017-08-12 NOTE — Pre-Procedure Instructions (Signed)
Adam Harvey  08/12/2017      [facility] [address]- [place], [place] - [address] [place] [id number] Phone: [phone] Fax: [phone]  [facility] - [place], [place] - [address] [place] [id number] Phone: [phone] Fax: [phone]    Your procedure is scheduled on Friday, Aug 20, 2017   [facility] Ad[facility] at 5:30 A.M.             (posted surgery time 7:30a - 9:39a)   Call this number if you have problems the morning of surgery:  [phone]   Remember:   Do not eat food or drink liquids after midnight on Thurs. Jan. 3   Take these medicines the morning of surgery with A SIP OF WATER : tylenol if needed, methocarbamol (robaxin) if needed,omeprazole (prilosec), propranolol (inderal), sertraline (zoloft), tramadol if needed            7 days prior to surgery STOP taking any Aspirin(unless otherwise instructed by your surgeon), Aleve, Naproxen, Ibuprofen, Motrin, Advil, Goody's, BC's, all herbal medications, fish oil, and all vitamins   Do not wear jewelry.  Do not wear lotions, powders,  perfumes, or deodorant.  Do not shave 48 hours prior to surgery.    Do not bring valuables to the hospital.  [facility] is not responsible for any belongings or valuables.  Contacts, dentures or bridgework may not be worn into surgery.  Leave your suitcase in the car.  After surgery it may be brought to your room. For patients admitted to the hospital, discharge time will be determined by your treatment team.     [facility]  Before surgery, you can play an important role. Because skin is not sterile, your skin needs to be as free of germs as possible. You can reduce the number of germs on your skin by washing with CHG (chlorahexidine gluconate) Soap before surgery.  CHG is an antiseptic cleaner which kills germs and bonds with the skin to continue  killing germs even after washing.  Please do not use if you have an allergy to CHG or antibacterial soaps. If your skin becomes reddened/irritated stop using the CHG.  Do not shave (including legs and underarms) for at least 48 hours prior to first CHG shower. It is OK to shave your face.  Please follow these instructions carefully.   1. Shower the NIGHT BEFORE SURGERY and the MORNING OF SURGERY with CHG.   2. If you chose to wash your hair, wash your hair first as usual with your normal shampoo.  3. After you shampoo, rinse your hair and body thoroughly to remove the shampoo.  4. Use CHG as you would any other liquid soap. You can apply CHG directly to the skin and wash gently with a scrungie or a clean washcloth.   5. Apply the CHG Soap to your body ONLY FROM THE NECK DOWN.  Do not use on open wounds or open sores. Avoid contact with your eyes, ears, mouth and genitals (private parts). Wash Face and genitals (private parts)  with your normal soap.  6. Wash thoroughly, paying special attention to the area where your surgery will be performed.  7. Thoroughly rinse your body with warm water from the neck down.  8. DO NOT shower/wash with your normal soap after using and rinsing off the CHG Soap.  9. Pat yourself dry with a CLEAN TOWEL.  10.  Wear CLEAN PAJAMAS to bed the night before surgery, wear comfortable clothes the morning of surgery  11. Place CLEAN SHEETS on your bed the night of your first shower and DO NOT SLEEP WITH PETS.    Day of Surgery: Do not apply any deodorants/lotions. Please wear clean clothes to the hospital/surgery center.      Please read over the following fact sheets that you were given. Coughing and Deep Breathing, MRSA Information and Surgical Site Infection Prevention

## 2017-08-12 NOTE — Progress Notes (Addendum)
PCP is Dr[facility] Geoffry Paradise[facility]ard Aronson [facility].  LOV 03/2017   Last office note requested. Denies any cardiac issues, no murmurs, no cp. When I inquired about taking Inderal, he said it given him yrs ago, and as a "calming agent".

## 2017-08-13 LAB — URINE CULTURE: Culture: NO GROWTH

## 2017-08-19 MED ORDER — DEXTROSE 5 % IV SOLN
3.0000 g | INTRAVENOUS | Status: AC
  Administered 2017-08-20: 3 g via INTRAVENOUS
  Filled 2017-08-19: qty 3

## 2017-08-19 MED ORDER — TRANEXAMIC ACID 1000 MG/10ML IV SOLN
1000.0000 mg | INTRAVENOUS | Status: AC
  Administered 2017-08-20: 1000 mg via INTRAVENOUS
  Filled 2017-08-19: qty 10

## 2017-08-19 MED ORDER — TRANEXAMIC ACID 1000 MG/10ML IV SOLN
2000.0000 mg | Freq: Once | INTRAVENOUS | Status: AC
  Administered 2017-08-20: 2000 mg via TOPICAL
  Filled 2017-08-19: qty 20

## 2017-08-19 MED ORDER — SODIUM CHLORIDE 0.9 % IV SOLN: INTRAVENOUS | Status: DC

## 2017-08-19 MED ORDER — BUPIVACAINE LIPOSOME 1.3 % IJ SUSP
20.0000 mL | INTRAMUSCULAR | Status: AC
  Administered 2017-08-20: 20 mL
  Filled 2017-08-19: qty 20

## 2017-08-20 ENCOUNTER — Encounter (HOSPITAL_COMMUNITY)
Admission: RE | Disposition: A | Discharge status: Home / Self Care | Payer: Self-pay | Source: Ambulatory Visit | Attending: Orthopedic Surgery

## 2017-08-20 ENCOUNTER — Inpatient Hospital Stay (HOSPITAL_COMMUNITY)
Admission: RE | Admit: 2017-08-20 | Discharge: 2017-08-23 | DRG: 470 | Disposition: A | Discharge status: Home / Self Care | Payer: BLUE CROSS/BLUE SHIELD | Source: Ambulatory Visit | Attending: Orthopedic Surgery | Admitting: Orthopedic Surgery

## 2017-08-20 ENCOUNTER — Inpatient Hospital Stay (HOSPITAL_COMMUNITY): Payer: BLUE CROSS/BLUE SHIELD | Admitting: Anesthesiology

## 2017-08-20 ENCOUNTER — Encounter (HOSPITAL_COMMUNITY): Payer: Self-pay | Admitting: *Deleted

## 2017-08-20 DIAGNOSIS — Z6836 Body mass index (BMI) 36.0-36.9, adult: Secondary | ICD-10-CM

## 2017-08-20 DIAGNOSIS — Z79899 Other long term (current) drug therapy: Secondary | ICD-10-CM | POA: Diagnosis not present

## 2017-08-20 DIAGNOSIS — M24561 Contracture, right knee: Secondary | ICD-10-CM | POA: Diagnosis present

## 2017-08-20 DIAGNOSIS — M1711 Unilateral primary osteoarthritis, right knee: Secondary | ICD-10-CM | POA: Diagnosis not present

## 2017-08-20 DIAGNOSIS — E785 Hyperlipidemia, unspecified: Secondary | ICD-10-CM | POA: Diagnosis not present

## 2017-08-20 DIAGNOSIS — G8918 Other acute postprocedural pain: Secondary | ICD-10-CM | POA: Diagnosis not present

## 2017-08-20 DIAGNOSIS — F419 Anxiety disorder, unspecified: Secondary | ICD-10-CM | POA: Diagnosis present

## 2017-08-20 DIAGNOSIS — I10 Essential (primary) hypertension: Secondary | ICD-10-CM | POA: Diagnosis present

## 2017-08-20 DIAGNOSIS — I951 Orthostatic hypotension: Secondary | ICD-10-CM | POA: Diagnosis not present

## 2017-08-20 DIAGNOSIS — F329 Major depressive disorder, single episode, unspecified: Secondary | ICD-10-CM | POA: Diagnosis not present

## 2017-08-20 DIAGNOSIS — Z791 Long term (current) use of non-steroidal anti-inflammatories (NSAID): Secondary | ICD-10-CM

## 2017-08-20 DIAGNOSIS — Z7982 Long term (current) use of aspirin: Secondary | ICD-10-CM | POA: Diagnosis not present

## 2017-08-20 DIAGNOSIS — F429 Obsessive-compulsive disorder, unspecified: Secondary | ICD-10-CM | POA: Diagnosis present

## 2017-08-20 DIAGNOSIS — E669 Obesity, unspecified: Secondary | ICD-10-CM | POA: Diagnosis not present

## 2017-08-20 DIAGNOSIS — Z87891 Personal history of nicotine dependence: Secondary | ICD-10-CM | POA: Diagnosis not present

## 2017-08-20 DIAGNOSIS — M25561 Pain in right knee: Secondary | ICD-10-CM | POA: Diagnosis not present

## 2017-08-20 DIAGNOSIS — K219 Gastro-esophageal reflux disease without esophagitis: Secondary | ICD-10-CM | POA: Diagnosis not present

## 2017-08-20 DIAGNOSIS — M2241 Chondromalacia patellae, right knee: Secondary | ICD-10-CM | POA: Diagnosis not present

## 2017-08-20 HISTORY — PX: TOTAL KNEE ARTHROPLASTY: SHX125

## 2017-08-20 SURGERY — ARTHROPLASTY, KNEE, TOTAL
Anesthesia: Spinal | Site: Knee | Laterality: Right

## 2017-08-20 MED ORDER — APIXABAN 2.5 MG PO TABS: 2.5000 mg | ORAL_TABLET | Freq: Two times a day (BID) | ORAL | 0 refills | Status: DC

## 2017-08-20 MED ORDER — OXYCODONE HCL 5 MG/5ML PO SOLN: 5.0000 mg | Freq: Once | ORAL | Status: AC | PRN

## 2017-08-20 MED ORDER — OXYCODONE HCL 5 MG PO TABS: ORAL_TABLET | ORAL | 0 refills | Status: DC

## 2017-08-20 MED ORDER — SODIUM CHLORIDE 0.9% FLUSH
INTRAVENOUS | Status: DC | PRN
  Administered 2017-08-20: 50 mL

## 2017-08-20 MED ORDER — PROPOFOL 500 MG/50ML IV EMUL
INTRAVENOUS | Status: DC | PRN
  Administered 2017-08-20: 100 ug/kg/min via INTRAVENOUS

## 2017-08-20 MED ORDER — ACETAMINOPHEN 650 MG RE SUPP: 650.0000 mg | RECTAL | Status: DC | PRN

## 2017-08-20 MED ORDER — BISACODYL 5 MG PO TBEC: 5.0000 mg | DELAYED_RELEASE_TABLET | Freq: Every day | ORAL | Status: DC | PRN

## 2017-08-20 MED ORDER — OXYCODONE HCL 5 MG PO TABS
ORAL_TABLET | ORAL | Status: AC
  Filled 2017-08-20: qty 1

## 2017-08-20 MED ORDER — ADULT MULTIVITAMIN W/MINERALS CH
1.0000 | ORAL_TABLET | Freq: Every day | ORAL | Status: DC
  Administered 2017-08-20 – 2017-08-23 (×4): 1 via ORAL
  Filled 2017-08-20 (×6): qty 1

## 2017-08-20 MED ORDER — CEFAZOLIN SODIUM-DEXTROSE 1-4 GM/50ML-% IV SOLN
1.0000 g | Freq: Four times a day (QID) | INTRAVENOUS | Status: AC
  Administered 2017-08-20 (×2): 1 g via INTRAVENOUS
  Filled 2017-08-20: qty 50

## 2017-08-20 MED ORDER — ACETAMINOPHEN 325 MG PO TABS
650.0000 mg | ORAL_TABLET | ORAL | Status: DC | PRN
  Administered 2017-08-22 – 2017-08-23 (×4): 650 mg via ORAL
  Filled 2017-08-20 (×5): qty 2

## 2017-08-20 MED ORDER — MIDAZOLAM HCL 5 MG/5ML IJ SOLN
INTRAMUSCULAR | Status: DC | PRN
  Administered 2017-08-20 (×2): 1 mg via INTRAVENOUS

## 2017-08-20 MED ORDER — ONDANSETRON HCL 4 MG PO TABS: 4.0000 mg | ORAL_TABLET | Freq: Four times a day (QID) | ORAL | Status: DC | PRN

## 2017-08-20 MED ORDER — HYDROMORPHONE HCL 1 MG/ML IJ SOLN
INTRAMUSCULAR | Status: AC
  Filled 2017-08-20: qty 1

## 2017-08-20 MED ORDER — METHOCARBAMOL 500 MG PO TABS
ORAL_TABLET | ORAL | Status: AC
  Filled 2017-08-20: qty 1

## 2017-08-20 MED ORDER — DOCUSATE SODIUM 100 MG PO CAPS
100.0000 mg | ORAL_CAPSULE | Freq: Two times a day (BID) | ORAL | Status: DC
  Administered 2017-08-20 – 2017-08-23 (×6): 100 mg via ORAL
  Filled 2017-08-20 (×6): qty 1

## 2017-08-20 MED ORDER — CHLORHEXIDINE GLUCONATE 4 % EX LIQD: 60.0000 mL | Freq: Once | CUTANEOUS | Status: DC

## 2017-08-20 MED ORDER — LISINOPRIL 20 MG PO TABS
20.0000 mg | ORAL_TABLET | Freq: Every day | ORAL | Status: DC
  Administered 2017-08-20 – 2017-08-23 (×3): 20 mg via ORAL
  Filled 2017-08-20 (×3): qty 1

## 2017-08-20 MED ORDER — METHOCARBAMOL 500 MG PO TABS: 500.0000 mg | ORAL_TABLET | Freq: Four times a day (QID) | ORAL | 0 refills | Status: DC | PRN

## 2017-08-20 MED ORDER — PROPOFOL 10 MG/ML IV BOLUS
INTRAVENOUS | Status: DC | PRN
  Administered 2017-08-20: 200 mg via INTRAVENOUS
  Administered 2017-08-20: 20 mg via INTRAVENOUS

## 2017-08-20 MED ORDER — SENNOSIDES-DOCUSATE SODIUM 8.6-50 MG PO TABS: 1.0000 | ORAL_TABLET | Freq: Every evening | ORAL | Status: DC | PRN

## 2017-08-20 MED ORDER — TRANEXAMIC ACID 1000 MG/10ML IV SOLN
1000.0000 mg | Freq: Once | INTRAVENOUS | Status: AC
  Administered 2017-08-20: 1000 mg via INTRAVENOUS
  Filled 2017-08-20: qty 10

## 2017-08-20 MED ORDER — HYDROCHLOROTHIAZIDE 12.5 MG PO CAPS
12.5000 mg | ORAL_CAPSULE | Freq: Every day | ORAL | Status: DC
  Administered 2017-08-20 – 2017-08-23 (×3): 12.5 mg via ORAL
  Filled 2017-08-20 (×3): qty 1

## 2017-08-20 MED ORDER — SERTRALINE HCL 100 MG PO TABS
200.0000 mg | ORAL_TABLET | Freq: Every day | ORAL | Status: DC
  Administered 2017-08-21 – 2017-08-23 (×3): 200 mg via ORAL
  Filled 2017-08-20 (×3): qty 2

## 2017-08-20 MED ORDER — LAMOTRIGINE 150 MG PO TABS
150.0000 mg | ORAL_TABLET | Freq: Every day | ORAL | Status: DC
  Administered 2017-08-20 – 2017-08-22 (×3): 150 mg via ORAL
  Filled 2017-08-20 (×3): qty 1

## 2017-08-20 MED ORDER — CELECOXIB 200 MG PO CAPS
200.0000 mg | ORAL_CAPSULE | Freq: Two times a day (BID) | ORAL | Status: DC
  Administered 2017-08-20 – 2017-08-22 (×5): 200 mg via ORAL
  Filled 2017-08-20 (×5): qty 1

## 2017-08-20 MED ORDER — METHOCARBAMOL 500 MG PO TABS
500.0000 mg | ORAL_TABLET | Freq: Four times a day (QID) | ORAL | Status: DC | PRN
  Administered 2017-08-20 – 2017-08-21 (×4): 500 mg via ORAL
  Filled 2017-08-20 (×3): qty 1

## 2017-08-20 MED ORDER — METHOCARBAMOL 1000 MG/10ML IJ SOLN
500.0000 mg | Freq: Four times a day (QID) | INTRAVENOUS | Status: DC | PRN
  Filled 2017-08-20: qty 5

## 2017-08-20 MED ORDER — MENTHOL 3 MG MT LOZG: 1.0000 | LOZENGE | OROMUCOSAL | Status: DC | PRN

## 2017-08-20 MED ORDER — BUPIVACAINE-EPINEPHRINE 0.25% -1:200000 IJ SOLN
INTRAMUSCULAR | Status: AC
  Filled 2017-08-20: qty 1

## 2017-08-20 MED ORDER — FENTANYL CITRATE (PF) 100 MCG/2ML IJ SOLN
INTRAMUSCULAR | Status: DC | PRN
  Administered 2017-08-20 (×5): 50 ug via INTRAVENOUS

## 2017-08-20 MED ORDER — OXYCODONE HCL 5 MG PO TABS
10.0000 mg | ORAL_TABLET | ORAL | Status: DC | PRN
  Administered 2017-08-20 – 2017-08-23 (×21): 10 mg via ORAL
  Filled 2017-08-20 (×20): qty 2

## 2017-08-20 MED ORDER — CELECOXIB 200 MG PO CAPS: 200.0000 mg | ORAL_CAPSULE | Freq: Two times a day (BID) | ORAL | 0 refills | Status: DC

## 2017-08-20 MED ORDER — APIXABAN 2.5 MG PO TABS
2.5000 mg | ORAL_TABLET | Freq: Two times a day (BID) | ORAL | Status: DC
  Administered 2017-08-21 – 2017-08-23 (×5): 2.5 mg via ORAL
  Filled 2017-08-20 (×5): qty 1

## 2017-08-20 MED ORDER — LISINOPRIL-HYDROCHLOROTHIAZIDE 20-12.5 MG PO TABS: 1.0000 | ORAL_TABLET | Freq: Every day | ORAL | Status: DC

## 2017-08-20 MED ORDER — SODIUM CHLORIDE 0.9 % IR SOLN
Status: DC | PRN
  Administered 2017-08-20: 3000 mL

## 2017-08-20 MED ORDER — HYDROMORPHONE HCL 1 MG/ML IJ SOLN
1.0000 mg | INTRAMUSCULAR | Status: DC | PRN
  Administered 2017-08-20 – 2017-08-22 (×12): 1 mg via INTRAVENOUS
  Filled 2017-08-20 (×12): qty 1

## 2017-08-20 MED ORDER — OXYCODONE HCL 5 MG PO TABS
ORAL_TABLET | ORAL | Status: AC
  Filled 2017-08-20: qty 2

## 2017-08-20 MED ORDER — FLEET ENEMA 7-19 GM/118ML RE ENEM: 1.0000 | ENEMA | Freq: Once | RECTAL | Status: DC | PRN

## 2017-08-20 MED ORDER — BUPIVACAINE IN DEXTROSE 0.75-8.25 % IT SOLN: INTRATHECAL | Status: DC | PRN

## 2017-08-20 MED ORDER — MIDAZOLAM HCL 2 MG/2ML IJ SOLN
INTRAMUSCULAR | Status: AC
  Filled 2017-08-20: qty 2

## 2017-08-20 MED ORDER — EPHEDRINE SULFATE 50 MG/ML IJ SOLN
INTRAMUSCULAR | Status: DC | PRN
  Administered 2017-08-20: 5 mg via INTRAVENOUS
  Administered 2017-08-20: 10 mg via INTRAVENOUS
  Administered 2017-08-20: 5 mg via INTRAVENOUS

## 2017-08-20 MED ORDER — MEPERIDINE HCL 25 MG/ML IJ SOLN: 6.2500 mg | INTRAMUSCULAR | Status: DC | PRN

## 2017-08-20 MED ORDER — ROPIVACAINE HCL 5 MG/ML IJ SOLN
INTRAMUSCULAR | Status: DC | PRN
  Administered 2017-08-20: 20 mL via PERINEURAL

## 2017-08-20 MED ORDER — OXYCODONE HCL 5 MG PO TABS
5.0000 mg | ORAL_TABLET | ORAL | Status: DC | PRN
  Administered 2017-08-20 – 2017-08-22 (×2): 5 mg via ORAL
  Filled 2017-08-20: qty 1

## 2017-08-20 MED ORDER — PROMETHAZINE HCL 25 MG/ML IJ SOLN: 6.2500 mg | INTRAMUSCULAR | Status: DC | PRN

## 2017-08-20 MED ORDER — BUPIVACAINE IN DEXTROSE 0.75-8.25 % IT SOLN
INTRATHECAL | Status: DC | PRN
  Administered 2017-08-20: 2 mL via INTRATHECAL

## 2017-08-20 MED ORDER — LACTATED RINGERS IV SOLN
INTRAVENOUS | Status: DC | PRN
  Administered 2017-08-20 (×2): via INTRAVENOUS

## 2017-08-20 MED ORDER — SIMVASTATIN 40 MG PO TABS
40.0000 mg | ORAL_TABLET | Freq: Every day | ORAL | Status: DC
  Administered 2017-08-21: 40 mg via ORAL
  Filled 2017-08-20 (×2): qty 1

## 2017-08-20 MED ORDER — PROPRANOLOL HCL 40 MG PO TABS
40.0000 mg | ORAL_TABLET | Freq: Every day | ORAL | Status: DC
  Administered 2017-08-21 – 2017-08-23 (×2): 40 mg via ORAL
  Filled 2017-08-20: qty 1
  Filled 2017-08-20: qty 2
  Filled 2017-08-20 (×2): qty 1
  Filled 2017-08-20: qty 2

## 2017-08-20 MED ORDER — METOCLOPRAMIDE HCL 5 MG/ML IJ SOLN: 5.0000 mg | Freq: Three times a day (TID) | INTRAMUSCULAR | Status: DC | PRN

## 2017-08-20 MED ORDER — BUPIVACAINE-EPINEPHRINE (PF) 0.25% -1:200000 IJ SOLN
INTRAMUSCULAR | Status: DC | PRN
  Administered 2017-08-20: 50 mL via PERINEURAL

## 2017-08-20 MED ORDER — OXYCODONE HCL 5 MG PO TABS
5.0000 mg | ORAL_TABLET | Freq: Once | ORAL | Status: AC | PRN
  Administered 2017-08-20: 5 mg via ORAL

## 2017-08-20 MED ORDER — PANTOPRAZOLE SODIUM 40 MG PO TBEC
40.0000 mg | DELAYED_RELEASE_TABLET | Freq: Every day | ORAL | Status: DC
  Administered 2017-08-21 – 2017-08-23 (×3): 40 mg via ORAL
  Filled 2017-08-20: qty 2
  Filled 2017-08-20 (×3): qty 1

## 2017-08-20 MED ORDER — PHENOL 1.4 % MT LIQD: 1.0000 | OROMUCOSAL | Status: DC | PRN

## 2017-08-20 MED ORDER — SODIUM CHLORIDE 0.9 % IV SOLN
INTRAVENOUS | Status: DC
  Administered 2017-08-22 – 2017-08-23 (×3): via INTRAVENOUS

## 2017-08-20 MED ORDER — ONDANSETRON HCL 4 MG/2ML IJ SOLN
INTRAMUSCULAR | Status: DC | PRN
  Administered 2017-08-20: 4 mg via INTRAVENOUS

## 2017-08-20 MED ORDER — ONDANSETRON HCL 4 MG/2ML IJ SOLN: 4.0000 mg | Freq: Four times a day (QID) | INTRAMUSCULAR | Status: DC | PRN

## 2017-08-20 MED ORDER — FENTANYL CITRATE (PF) 250 MCG/5ML IJ SOLN
INTRAMUSCULAR | Status: AC
  Filled 2017-08-20: qty 5

## 2017-08-20 MED ORDER — METOCLOPRAMIDE HCL 5 MG PO TABS: 5.0000 mg | ORAL_TABLET | Freq: Three times a day (TID) | ORAL | Status: DC | PRN

## 2017-08-20 MED ORDER — HYDROMORPHONE HCL 1 MG/ML IJ SOLN
0.2500 mg | INTRAMUSCULAR | Status: DC | PRN
  Administered 2017-08-20 (×4): 0.5 mg via INTRAVENOUS

## 2017-08-20 MED ORDER — ONDANSETRON HCL 4 MG PO TABS: 4.0000 mg | ORAL_TABLET | Freq: Four times a day (QID) | ORAL | 0 refills | Status: DC | PRN

## 2017-08-20 MED ORDER — ACETAMINOPHEN ER 650 MG PO TBCR: 1300.0000 mg | EXTENDED_RELEASE_TABLET | Freq: Three times a day (TID) | ORAL | Status: DC | PRN

## 2017-08-20 MED ORDER — PROPOFOL 10 MG/ML IV BOLUS
INTRAVENOUS | Status: AC
  Filled 2017-08-20: qty 20

## 2017-08-20 SURGICAL SUPPLY — 54 items
BANDAGE ACE 4X5 VEL STRL LF (GAUZE/BANDAGES/DRESSINGS) ×2 IMPLANT
BANDAGE ACE 6X5 VEL STRL LF (GAUZE/BANDAGES/DRESSINGS) ×2 IMPLANT
BANDAGE ESMARK 6X9 LF (GAUZE/BANDAGES/DRESSINGS) ×1 IMPLANT
BLADE SAGITTAL 25.0X1.19X90 (BLADE) ×2 IMPLANT
BLADE SAW SAG 90X13X1.27 (BLADE) ×2 IMPLANT
BNDG ESMARK 6X9 LF (GAUZE/BANDAGES/DRESSINGS) ×2
BONE CEMENT GENTAMICIN (Cement) ×4 IMPLANT
BOWL SMART MIX CTS (DISPOSABLE) ×2 IMPLANT
CAP KNEE TOTAL 3 SIGMA ×2 IMPLANT
CEMENT BONE GENTAMICIN 40 (Cement) ×2 IMPLANT
COVER SURGICAL LIGHT HANDLE (MISCELLANEOUS) ×2 IMPLANT
CUFF TOURNIQUET SINGLE 34IN LL (TOURNIQUET CUFF) ×2 IMPLANT
DRAPE ORTHO SPLIT 77X108 STRL (DRAPES) ×2
DRAPE SURG ORHT 6 SPLT 77X108 (DRAPES) ×2 IMPLANT
DRAPE U-SHAPE 47X51 STRL (DRAPES) ×2 IMPLANT
DRSG ADAPTIC 3X8 NADH LF (GAUZE/BANDAGES/DRESSINGS) ×2 IMPLANT
DURAPREP 26ML APPLICATOR (WOUND CARE) ×4 IMPLANT
ELECT REM PT RETURN 9FT ADLT (ELECTROSURGICAL) ×2
ELECTRODE REM PT RTRN 9FT ADLT (ELECTROSURGICAL) ×1 IMPLANT
EVACUATOR 1/8 PVC DRAIN (DRAIN) IMPLANT
FACESHIELD WRAPAROUND (MASK) ×2 IMPLANT
GAUZE SPONGE 4X4 12PLY STRL (GAUZE/BANDAGES/DRESSINGS) ×2 IMPLANT
GLOVE BIOGEL PI IND STRL 8 (GLOVE) ×4 IMPLANT
GLOVE BIOGEL PI INDICATOR 8 (GLOVE) ×4
GLOVE ORTHO TXT STRL SZ7.5 (GLOVE) ×2 IMPLANT
GLOVE SURG ORTHO 8.0 STRL STRW (GLOVE) ×2 IMPLANT
GOWN STRL REUS W/ TWL XL LVL3 (GOWN DISPOSABLE) ×2 IMPLANT
GOWN STRL REUS W/TWL 2XL LVL3 (GOWN DISPOSABLE) ×2 IMPLANT
GOWN STRL REUS W/TWL XL LVL3 (GOWN DISPOSABLE) ×2
HANDPIECE INTERPULSE COAX TIP (DISPOSABLE) ×1
HOOD PEEL AWAY FACE SHEILD DIS (HOOD) ×2 IMPLANT
IMMOBILIZER KNEE 22 UNIV (SOFTGOODS) ×2 IMPLANT
KIT BASIN OR (CUSTOM PROCEDURE TRAY) ×2 IMPLANT
KIT ROOM TURNOVER OR (KITS) ×2 IMPLANT
MANIFOLD NEPTUNE II (INSTRUMENTS) ×2 IMPLANT
NEEDLE 22X1 1/2 (OR ONLY) (NEEDLE) ×4 IMPLANT
NS IRRIG 1000ML POUR BTL (IV SOLUTION) ×2 IMPLANT
PACK TOTAL JOINT (CUSTOM PROCEDURE TRAY) ×2 IMPLANT
PAD ABD 8X10 STRL (GAUZE/BANDAGES/DRESSINGS) ×2 IMPLANT
PAD ARMBOARD 7.5X6 YLW CONV (MISCELLANEOUS) ×4 IMPLANT
PAD CAST 4YDX4 CTTN HI CHSV (CAST SUPPLIES) ×1 IMPLANT
PADDING CAST COTTON 4X4 STRL (CAST SUPPLIES) ×1
PADDING CAST COTTON 6X4 STRL (CAST SUPPLIES) ×2 IMPLANT
SET HNDPC FAN SPRY TIP SCT (DISPOSABLE) ×1 IMPLANT
SUCTION FRAZIER HANDLE 10FR (MISCELLANEOUS) ×1
SUCTION TUBE FRAZIER 10FR DISP (MISCELLANEOUS) ×1 IMPLANT
SUT ETHIBOND NAB CT1 #1 30IN (SUTURE) ×4 IMPLANT
SUT VIC AB 0 CT1 27 (SUTURE) ×1
SUT VIC AB 0 CT1 27XBRD ANBCTR (SUTURE) ×1 IMPLANT
SUT VIC AB 2-0 CT1 27 (SUTURE) ×2
SUT VIC AB 2-0 CT1 TAPERPNT 27 (SUTURE) ×2 IMPLANT
SYR CONTROL 10ML LL (SYRINGE) ×4 IMPLANT
TOWEL GREEN STERILE (TOWEL DISPOSABLE) ×2 IMPLANT
TRAY URETHRAL FOLEY CATH 14FR (CATHETERS) ×2 IMPLANT

## 2017-08-20 NOTE — Anesthesia Postprocedure Evaluation (Signed)
Anesthesia Post Note  Patient: Adam Harvey  Procedure(s) Performed: TOTAL KNEE ARTHROPLASTY (Right Knee)     Patient location during evaluation: PACU Anesthesia Type: Spinal Level of consciousness: oriented and awake and alert Pain management: pain level controlled Vital Signs Assessment: post-procedure vital signs reviewed and stable Respiratory status: spontaneous breathing and respiratory function stable Cardiovascular status: blood pressure returned to baseline and stable Postop Assessment: no headache, no backache and no apparent nausea or vomiting Anesthetic complications: no    Last Vitals:  Vitals:   08/20/17 1057 08/20/17 1105  BP: 128/82 (!) 145/82  Pulse: 70 69  Resp: 11 15  Temp:    SpO2: 96% 98%    Last Pain:  Vitals:   08/20/17 1130  TempSrc:   PainSc: 4                  Lowella CurbWarren Ray Nekita Pita

## 2017-08-20 NOTE — Anesthesia Procedure Notes (Signed)
Procedure Name: MAC Date/Time: 08/20/2017 7:48 AM Performed by: White, Amedeo Plenty, CRNA Pre-anesthesia Checklist: Patient identified, Emergency Drugs available, Suction available and Patient being monitored Patient Re-evaluated:Patient Re-evaluated prior to induction Oxygen Delivery Method: Simple face mask

## 2017-08-20 NOTE — Transfer of Care (Signed)
Immediate Anesthesia Transfer of Care Note  Patient: Adam Harvey  Procedure(s) Performed: TOTAL KNEE ARTHROPLASTY (Right Knee)  Patient Location: PACU  Anesthesia Type:GA combined with regional for post-op pain  Level of Consciousness: awake, alert  and patient cooperative  Airway & Oxygen Therapy: Patient Spontanous Breathing  Post-op Assessment: Report given to RN and Post -op Vital signs reviewed and stable  Post vital signs: Reviewed and stable  Last Vitals:  Vitals:   08/20/17 0549 08/20/17 1013  BP: (!) 154/99 129/85  Pulse: 64 74  Resp: 18 17  Temp: 36.6 C 37.1 C  SpO2: 97% 95%    Last Pain:  Vitals:   08/20/17 0549  TempSrc: Oral         Complications: No apparent anesthesia complications

## 2017-08-20 NOTE — Interval H&P Note (Signed)
History and Physical Interval Note:  08/20/2017 7:29 AM  Adam Harvey  has presented today for surgery, with the diagnosis of OA RIGHT KNEE  The various methods of treatment have been discussed with the patient and family. After consideration of risks, benefits and other options for treatment, the patient has consented to  Procedure(s): TOTAL KNEE ARTHROPLASTY (Right) as a surgical intervention .  The patient's history has been reviewed, patient examined, no change in status, stable for surgery.  I have reviewed the patient's chart and labs.  Questions were answered to the patient's satisfaction.     Thera FlakeW D Drenda Sobecki Jr

## 2017-08-20 NOTE — Evaluation (Signed)
Physical Therapy Evaluation Patient Details Name: Adam Harvey MRN: [id number] DOB: [birth year] Today's Date: 08/20/2017   History of Present Illness  Pt is a 57 y/o male s/p elective R TKA. PMH includes HTN, depression, and R rotator cuff repair.   Clinical Impression  Pt is s/p surgery above with deficits below. PTA, pt was independent with functional mobility. Upon eval, pt presenting with post op pain and weakness, and decreased balance. Limited session secondary to pain and only able to tolerate stand pivot transfer to chair using RW. Required min A for functional mobility. Reports son will be able to assist at d/c and has all necessary DME. Per pt, will be receiving HHPT at d/c. Will continue to follow acutely to maximize functional mobility independence and safety.       Follow Up Recommendations DC plan and follow up therapy as arranged by surgeon;Supervision for mobility/OOB    Equipment Recommendations  None recommended by PT    Recommendations for Other Services       Precautions / Restrictions Precautions Precautions: Knee Precaution Booklet Issued: Yes (comment) Precaution Comments: Reviewed supine ther ex  Required Braces or Orthoses: Knee Immobilizer - Right Knee Immobilizer - Right: Other (comment)(until discontinued ) Restrictions Weight Bearing Restrictions: Yes RLE Weight Bearing: Weight bearing as tolerated      Mobility  Bed Mobility Overal bed mobility: Needs Assistance Bed Mobility: Supine to Sit     Supine to sit: Min assist     General bed mobility comments: Min A for RLE assist.   Transfers Overall transfer level: Needs assistance Equipment used: Rolling walker (2 wheeled) Transfers: Sit to/from [employer] Sit to Stand: Min assist Stand pivot transfers: Min guard       General transfer comment: Sit<>stand X 2 this session to use urinal and another time to transfer to chair. Min A for steadying assist and verbal cues for safe  hand placement. Min guard for transfer to recliner. Required verbal cues for sequencing using RW. Limited weightshift to RLE secondary to pain.   Ambulation/Gait             General Gait Details: NT secondary to increased pain. Able to take a few steps during stand pivot transfer.   Stairs            Wheelchair Mobility    Modified Rankin (Stroke Patients Only)       Balance Overall balance assessment: Needs assistance Sitting-balance support: No upper extremity supported;Feet supported Sitting balance-Leahy Scale: Good     Standing balance support: Bilateral upper extremity supported;During functional activity Standing balance-Leahy Scale: Poor Standing balance comment: Reliant on BUE support.                              Pertinent Vitals/Pain Pain Assessment: 0-10 Pain Score: 9  Pain Location:  R knee  Pain Descriptors / Indicators: Aching;Operative site guarding Pain Intervention(s): Limited activity within patient's tolerance;Monitored during session;Repositioned    Home Living Family/patient expects to be discharged to:: Private residence Living Arrangements: Alone Available Help at Discharge: Family;Friend(s);Available 24 hours/day Type of Home: House Home Access: Stairs to enter Entrance Stairs-Rails: None Entrance Stairs-Number of Steps: 1 Home Layout: One level Home Equipment: Walker - 2 wheels;Bedside commode;Other (comment)(CPM ) Additional Comments: Reports son will stay at night and friends will stay during the day.     Prior Function Level of Independence: Independent  Hand Dominance   Dominant Hand: Right    Extremity/Trunk Assessment   Upper Extremity Assessment Upper Extremity Assessment: Defer to OT evaluation    Lower Extremity Assessment Lower Extremity Assessment: RLE deficits/detail RLE Deficits / Details: Sensory in tact. Deficits consistent with post op pain and weakness. Able to perform ther  ex below.     Cervical / Trunk Assessment Cervical / Trunk Assessment: Normal  Communication   Communication: No difficulties  Cognition Arousal/Alertness: Awake/alert Behavior During Therapy: WFL for tasks assessed/performed Overall Cognitive Status: Within Functional Limits for tasks assessed                                        General Comments General comments (skin integrity, edema, etc.): Limited tolerance in session secondary to pain.     Exercises Total Joint Exercises Ankle Circles/Pumps: AROM;Both;20 reps Quad Sets: AROM;Right;10 reps Towel Squeeze: (verbal education ) Heel Slides: (verbal education )   Assessment/Plan    PT Assessment Patient needs continued PT services  PT Problem List Decreased strength;Decreased range of motion;Decreased activity tolerance;Decreased balance;Decreased mobility;Decreased knowledge of use of DME;Decreased knowledge of precautions;Pain       PT Treatment Interventions DME instruction;Gait training;Stair training;Functional mobility training;Therapeutic activities;Balance training;Therapeutic exercise;Neuromuscular re-education;Patient/family education    PT Goals (Current goals can be found in the Care Plan section)  Acute Rehab PT Goals Patient Stated Goal: to go home tomorrow  PT Goal Formulation: With patient Time For Goal Achievement: 08/27/17 Potential to Achieve Goals: Good    Frequency 7X/week   Barriers to discharge        Co-evaluation               AM-PAC PT "6 Clicks" Daily Activity  Outcome Measure Difficulty turning over in bed (including adjusting bedclothes, sheets and blankets)?: None Difficulty moving from lying on back to sitting on the side of the bed? : Unable Difficulty sitting down on and standing up from a chair with arms (e.g., wheelchair, bedside commode, etc,.)?: Unable Help needed moving to and from a bed to chair (including a wheelchair)?: A Little Help needed walking  in hospital room?: A Little Help needed climbing 3-5 steps with a railing? : A Lot 6 Click Score: 14    End of Session Equipment Utilized During Treatment: Gait belt;Right knee immobilizer Activity Tolerance: Patient limited by pain Patient left: in chair;with call bell/phone within reach Nurse Communication: Mobility status PT Visit Diagnosis: Other abnormalities of gait and mobility (R26.89);Pain Pain - Right/Left: Right Pain - part of body: Knee    Time: 1705-1736 PT Time Calculation (min) (ACUTE ONLY): 31 min   Charges:   PT Evaluation $PT Eval Low Complexity: 1 Low PT Treatments $Therapeutic Activity: 8-22 mins   PT G Codes:        Gladys DammeBrittany Mcgregor Tinnon, PT, DPT  Acute Rehabilitation Services  Pager: [phone]   Lehman PromBrittany S Nikesha Kwasny 08/20/2017, 6:46 PM

## 2017-08-20 NOTE — Progress Notes (Signed)
Orthopedic Tech Progress Note Patient Details:  Lynda RainwaterHunter Sudbury 03/11/1961 [id number]  CPM Right Knee CPM Right Knee: On Right Knee Flexion (Degrees): 90 Right Knee Extension (Degrees): 0 Additional Comments: foot roll  Post Interventions Patient Tolerated: Well Instructions Provided: Care of device, Adjustment of device  Saul FordyceJennifer C Ruweyda Macknight 08/20/2017, 10:59 AM

## 2017-08-20 NOTE — Anesthesia Preprocedure Evaluation (Signed)
Anesthesia Evaluation   Patient awake    Reviewed: Allergy & Precautions, NPO status , Patient's Chart, lab work & pertinent test results, reviewed documented beta blocker date and time   History of Anesthesia Complications Negative for: history of anesthetic complications  Airway Mallampati: II  TM Distance: >3 FB Neck ROM: Full    Dental no notable dental hx.    Pulmonary former smoker,    Pulmonary exam normal breath sounds clear to auscultation       Cardiovascular hypertension, Pt. on medications and Pt. on home beta blockers Normal cardiovascular exam Rhythm:Regular Rate:Normal     Neuro/Psych PSYCHIATRIC DISORDERS Anxiety Depression negative neurological ROS     GI/Hepatic negative GI ROS, Neg liver ROS, GERD  ,  Endo/Other  negative endocrine ROS  Renal/GU negative Renal ROS     Musculoskeletal  (+) Arthritis , Osteoarthritis,    Abdominal (+) + obese,   Peds  Hematology   Anesthesia Other Findings   Reproductive/Obstetrics                             Anesthesia Physical  Anesthesia Plan  ASA: II  Anesthesia Plan: Spinal   Post-op Pain Management:  Regional for Post-op pain   Induction: Intravenous  PONV Risk Score and Plan: Ondansetron  Airway Management Planned: Simple Face Mask  Additional Equipment:   Intra-op Plan:   Post-operative Plan:   Informed Consent: I have reviewed the patients History and Physical, chart, labs and discussed the procedure including the risks, benefits and alternatives for the proposed anesthesia with the patient or authorized representative who has indicated his/her understanding and acceptance.   Dental advisory given  Plan Discussed with: CRNA  Anesthesia Plan Comments:         Anesthesia Quick Evaluation

## 2017-08-20 NOTE — Brief Op Note (Signed)
08/20/2017  10:24 AM  PATIENT:  Adam Harvey  57 y.o. male  PRE-OPERATIVE DIAGNOSIS:  OSTEOARTHRITIS RIGHT KNEE  POST-OPERATIVE DIAGNOSIS:  OSTEOARTHRITIS RIGHT KNEE  PROCEDURE:  Procedure(s): TOTAL KNEE ARTHROPLASTY (Right)  SURGEON:  Surgeon(s) and Role:    * Frederico Hammanaffrey, Daniel, MD - Primary  PHYSICIAN ASSISTANT: Margart SicklesJoshua Clista Rainford, PA-C  ASSISTANTS:    ANESTHESIA:   local, spinal and general  EBL:  50 mL   BLOOD ADMINISTERED:none  DRAINS: none   LOCAL MEDICATIONS USED:  MARCAINE     SPECIMEN:  No Specimen  DISPOSITION OF SPECIMEN:  [name]  COUNTS:  YES  TOURNIQUET:   Total Tourniquet Time Documented: Thigh (Right) - 85 minutes Total: Thigh (Right) - 85 minutes   DICTATION: .Other Dictation: Dictation Number unknown  PLAN OF CARE: Admit to inpatient   PATIENT DISPOSITION:  PACU - hemodynamically stable.   Delay start of Pharmacological VTE agent (>24hrs) due to surgical blood loss or risk of bleeding: yes

## 2017-08-20 NOTE — Anesthesia Procedure Notes (Signed)
Spinal  Start time: 08/20/2017 7:39 AM End time: 08/20/2017 7:44 AM Staffing Anesthesiologist: Lowella CurbMiller, Jaquelyne Firkus Ray, MD Performed: anesthesiologist  Preanesthetic Checklist Completed: patient identified, site marked, surgical consent, pre-op evaluation, timeout performed, IV checked, risks and benefits discussed and monitors and equipment checked Spinal Block Patient position: sitting Prep: Betadine Patient monitoring: heart rate, cardiac monitor, continuous pulse ox and blood pressure Approach: midline Location: L3-4 Injection technique: single-shot Needle Needle type: Pencan  Needle gauge: 24 G Needle length: 9 cm Assessment Events: failed spinal Additional Notes Failed Spinal.  Converted to GA with LMA

## 2017-08-20 NOTE — Anesthesia Procedure Notes (Signed)
Anesthesia Regional Block: Adductor canal block   Pre-Anesthetic Checklist: ,, timeout performed, Correct Patient, Correct Site, Correct Laterality, Correct Procedure, Correct Position, site marked, Risks and benefits discussed,  Surgical consent,  Pre-op evaluation,  At surgeon's request and post-op pain management  Laterality: Right  Prep: chloraprep       Needles:  Injection technique: Single-shot  Needle Type: Stimiplex     Needle Length: 9cm  Needle Gauge: 21     Additional Needles:   Procedures:,,,, ultrasound used (permanent image in chart),,,,  Narrative:  Start time: 08/20/2017 7:02 AM End time: 08/20/2017 7:07 AM Injection made incrementally with aspirations every 5 mL.  Performed by: Personally  Anesthesiologist: Lowella CurbMiller, Deadra Diggins Ray, MD

## 2017-08-20 NOTE — Anesthesia Procedure Notes (Signed)
Procedure Name: LMA Insertion Date/Time: 08/20/2017 8:07 AM Performed by: White, Cordella RegisterKelsey Tena Gwyneth Fernandez, CRNA Pre-anesthesia Checklist: Patient identified, Emergency Drugs available, Suction available and Patient being monitored Patient Re-evaluated:Patient Re-evaluated prior to induction Oxygen Delivery Method: Circle System Utilized Preoxygenation: Pre-oxygenation with 100% oxygen Induction Type: IV induction Ventilation: Mask ventilation without difficulty LMA: LMA inserted LMA Size: 5.0 Number of attempts: 1 Airway Equipment and Method: Bite block Placement Confirmation: positive ETCO2 Tube secured with: Tape Dental Injury: Teeth and Oropharynx as per pre-operative assessment

## 2017-08-21 LAB — BASIC METABOLIC PANEL
Anion gap: 10 (ref 5–15)
BUN: 18 mg/dL (ref 6–20)
CO2: 26 mmol/L (ref 22–32)
Calcium: 8.5 mg/dL — ABNORMAL LOW (ref 8.9–10.3)
Chloride: 93 mmol/L — ABNORMAL LOW (ref 101–111)
Creatinine, Ser: 0.99 mg/dL (ref 0.61–1.24)
GFR calc Af Amer: >60 mL/min (ref >60)
GFR calc non Af Amer: >60 mL/min (ref >60)
Glucose, Bld: 139 mg/dL — ABNORMAL HIGH (ref 65–99)
Potassium: 4.2 mmol/L (ref 3.5–5.1)
Sodium: 129 mmol/L — ABNORMAL LOW (ref 135–145)

## 2017-08-21 LAB — CBC
HCT: 36.9 % — ABNORMAL LOW (ref 39.0–52.0)
Hemoglobin: 12.5 g/dL — ABNORMAL LOW (ref 13.0–17.0)
MCH: 30.5 pg (ref 26.0–34.0)
MCHC: 33.9 g/dL (ref 30.0–36.0)
MCV: 90 fL (ref 78.0–100.0)
Platelets: 198 10*3/uL (ref 150–400)
RBC: 4.1 MIL/uL — ABNORMAL LOW (ref 4.22–5.81)
RDW: 13.3 % (ref 11.5–15.5)
WBC: 12 10*3/uL — ABNORMAL HIGH (ref 4.0–10.5)

## 2017-08-21 MED ORDER — DEXTROSE 5 % IV SOLN
500.0000 mg | Freq: Four times a day (QID) | INTRAVENOUS | Status: DC | PRN
  Filled 2017-08-21: qty 5

## 2017-08-21 MED ORDER — METHOCARBAMOL 750 MG PO TABS
750.0000 mg | ORAL_TABLET | Freq: Four times a day (QID) | ORAL | Status: DC | PRN
  Administered 2017-08-21 – 2017-08-23 (×7): 750 mg via ORAL
  Filled 2017-08-21 (×7): qty 1

## 2017-08-21 NOTE — Discharge Instructions (Signed)
INSTRUCTIONS AFTER JOINT REPLACEMENT  ° °o Remove items at home which could result in a fall. This includes throw rugs or furniture in walking pathways °o ICE to the affected joint every three hours while awake for 30 minutes at a time, for at least the first 3-5 days, and then as needed for pain and swelling.  Continue to use ice for pain and swelling. You may notice swelling that will progress down to the foot and ankle.  This is normal after surgery.  Elevate your leg when you are not up walking on it.   °o Continue to use the breathing machine you got in the hospital (incentive spirometer) which will help keep your temperature down.  It is common for your temperature to cycle up and down following surgery, especially at night when you are not up moving around and exerting yourself.  The breathing machine keeps your lungs expanded and your temperature down. ° ° °DIET:  As you were doing prior to hospitalization, we recommend a well-balanced diet. ° °DRESSING / WOUND CARE / SHOWERING ° °You may change your dressing 3-5 days after surgery.  Then change the dressing every day with sterile gauze.  Please use good hand washing techniques before changing the dressing.  Do not use any lotions or creams on the incision until instructed by your surgeon. ° °ACTIVITY ° °o Increase activity slowly as tolerated, but follow the weight bearing instructions below.   °o No driving for 6 weeks or until further direction given by your physician.  You cannot drive while taking narcotics.  °o No lifting or carrying greater than 10 lbs. until further directed by your surgeon. °o Avoid periods of inactivity such as sitting longer than an hour when not asleep. This helps prevent blood clots.  °o You may return to work once you are authorized by your doctor.  ° ° ° °WEIGHT BEARING  ° °Weight bearing as tolerated with assist device (walker, cane, etc) as directed, use it as long as suggested by your surgeon or therapist, typically at  least 4-6 weeks. ° ° °EXERCISES ° °Results after joint replacement surgery are often greatly improved when you follow the exercise, range of motion and muscle strengthening exercises prescribed by your doctor. Safety measures are also important to protect the joint from further injury. Any time any of these exercises cause you to have increased pain or swelling, decrease what you are doing until you are comfortable again and then slowly increase them. If you have problems or questions, call your caregiver or physical therapist for advice.  ° °Rehabilitation is important following a joint replacement. After just a few days of immobilization, the muscles of the leg can become weakened and shrink (atrophy).  These exercises are designed to build up the tone and strength of the thigh and leg muscles and to improve motion. Often times heat used for twenty to thirty minutes before working out will loosen up your tissues and help with improving the range of motion but do not use heat for the first two weeks following surgery (sometimes heat can increase post-operative swelling).  ° °These exercises can be done on a training (exercise) mat, on the floor, on a table or on a bed. Use whatever works the best and is most comfortable for you.    Use music or television while you are exercising so that the exercises are a pleasant break in your day. This will make your life better with the exercises acting as a break   in your routine that you can look forward to.   Perform all exercises about fifteen times, three times per day or as directed.  You should exercise both the operative leg and the other leg as well. ° °Exercises include: °  °• Quad Sets - Tighten up the muscle on the front of the thigh (Quad) and hold for 5-10 seconds.   °• Straight Leg Raises - With your knee straight (if you were given a brace, keep it on), lift the leg to 60 degrees, hold for 3 seconds, and slowly lower the leg.  Perform this exercise against  resistance later as your leg gets stronger.  °• Leg Slides: Lying on your back, slowly slide your foot toward your buttocks, bending your knee up off the floor (only go as far as is comfortable). Then slowly slide your foot back down until your leg is flat on the floor again.  °• Angel Wings: Lying on your back spread your legs to the side as far apart as you can without causing discomfort.  °• Hamstring Strength:  Lying on your back, push your heel against the floor with your leg straight by tightening up the muscles of your buttocks.  Repeat, but this time bend your knee to a comfortable angle, and push your heel against the floor.  You may put a pillow under the heel to make it more comfortable if necessary.  ° °A rehabilitation program following joint replacement surgery can speed recovery and prevent re-injury in the future due to weakened muscles. Contact your doctor or a physical therapist for more information on knee rehabilitation.  ° ° °CONSTIPATION ° °Constipation is defined medically as fewer than three stools per week and severe constipation as less than one stool per week.  Even if you have a regular bowel pattern at home, your normal regimen is likely to be disrupted due to multiple reasons following surgery.  Combination of anesthesia, postoperative narcotics, change in appetite and fluid intake all can affect your bowels.  ° °YOU MUST use at least one of the following options; they are listed in order of increasing strength to get the job done.  They are all available over the counter, and you may need to use some, POSSIBLY even all of these options:   ° °Drink plenty of fluids (prune juice may be helpful) and high fiber foods °Colace 100 mg by mouth twice a day  °Senokot for constipation as directed and as needed Dulcolax (bisacodyl), take with full glass of water  °Miralax (polyethylene glycol) once or twice a day as needed. ° °If you have tried all these things and are unable to have a bowel  movement in the first 3-4 days after surgery call either your surgeon or your primary doctor.   ° °If you experience loose stools or diarrhea, hold the medications until you stool forms back up.  If your symptoms do not get better within 1 week or if they get worse, check with your doctor.  If you experience "the worst abdominal pain ever" or develop nausea or vomiting, please contact the office immediately for further recommendations for treatment. ° ° °ITCHING:  If you experience itching with your medications, try taking only a single pain pill, or even half a pain pill at a time.  You can also use Benadryl over the counter for itching or also to help with sleep.  ° °TED HOSE STOCKINGS:  Use stockings on both legs until for at least 2 weeks or as   directed by physician office. They may be removed at night for sleeping. ° °MEDICATIONS:  See your medication summary on the “After Visit Summary” that nursing will review with you.  You may have some home medications which will be placed on hold until you complete the course of blood thinner medication.  It is important for you to complete the blood thinner medication as prescribed. ° °PRECAUTIONS:  If you experience chest pain or shortness of breath - call 911 immediately for transfer to the hospital emergency department.  ° °If you develop a fever greater that 101 F, purulent drainage from wound, increased redness or drainage from wound, foul odor from the wound/dressing, or calf pain - CONTACT YOUR SURGEON.   °                                                °FOLLOW-UP APPOINTMENTS:  If you do not already have a post-op appointment, please call the office for an appointment to be seen by your surgeon.  Guidelines for how soon to be seen are listed in your “After Visit Summary”, but are typically between 1-4 weeks after surgery. ° °OTHER INSTRUCTIONS:  ° °Knee Replacement:  Do not place pillow under knee, focus on keeping the knee straight while resting. CPM  instructions: 0-90 degrees, 2 hours in the morning, 2 hours in the afternoon, and 2 hours in the evening. Place foam block, curve side up under heel at all times except when in CPM or when walking.  DO NOT modify, tear, cut, or change the foam block in any way. ° °MAKE SURE YOU:  °• Understand these instructions.  °• Get help right away if you are not doing well or get worse.  ° ° °Thank you for letting us be a part of your medical care team.  It is a privilege we respect greatly.  We hope these instructions will help you stay on track for a fast and full recovery!  ° ° ° ° ° °Information on my medicine - ELIQUIS® (apixaban) ° °Why was Eliquis® prescribed for you? °Eliquis® was prescribed for you to reduce the risk of blood clots forming after orthopedic surgery.   ° °What do You need to know about Eliquis®? °Take your Eliquis® TWICE DAILY - one tablet in the morning and one tablet in the evening with or without food.  It would be best to take the dose about the same time each day. ° °If you have difficulty swallowing the tablet whole please discuss with your pharmacist how to take the medication safely. ° °Take Eliquis® exactly as prescribed by your doctor and DO NOT stop taking Eliquis® without talking to the doctor who prescribed the medication.  Stopping without other medication to take the place of Eliquis® may increase your risk of developing a clot. ° °After discharge, you should have regular check-up appointments with your healthcare provider that is prescribing your Eliquis®. ° °What do you do if you miss a dose? °If a dose of ELIQUIS® is not taken at the scheduled time, take it as soon as possible on the same day and twice-daily administration should be resumed.  The dose should not be doubled to make up for a missed dose.  Do not take more than one tablet of ELIQUIS at the same time. ° °Important Safety Information °A possible side effect of Eliquis®   is bleeding. You should call your healthcare provider  right away if you experience any of the following: °? Bleeding from an injury or your nose that does not stop. °? Unusual colored urine (red or dark brown) or unusual colored stools (red or black). °? Unusual bruising for unknown reasons. °? A serious fall or if you hit your head (even if there is no bleeding). ° °Some medicines may interact with Eliquis® and might increase your risk of bleeding or clotting while on Eliquis®. To help avoid this, consult your healthcare provider or pharmacist prior to using any new prescription or non-prescription medications, including herbals, vitamins, non-steroidal anti-inflammatory drugs (NSAIDs) and supplements. ° °This website has more information on Eliquis® (apixaban): http://www.eliquis.com/eliquis/home ° ° °

## 2017-08-21 NOTE — Evaluation (Signed)
Occupational Therapy Evaluation Patient Details Name: Adam Harvey MRN: [id number] DOB: [birth year] Today's Date: 08/21/2017    History of Present Illness Pt is a 57 y/o male s/p elective R TKA. PMH includes HTN, depression, and R rotator cuff repair.    Clinical Impression   PTA, pt was living alone and was independent. Pt planning to have his son and friends stay 24/7 at dc. Currently, pt requires Min A for LB ADLs and Min Guard A for functional mobility using RW. Provided education on compensatory techniques for LB ADLs; pt demonstrated understanding. Educated pt on bed mobility and compensatory techniques for lifting RLE towards EOB; pt using techniques and performing bed mobility with supervision. Pt would benefit form further acute OT to address safe toilet and tub transfers. Recommend dc home once medically stable per physician.      Follow Up Recommendations  No OT follow up;Supervision/Assistance - 24 hour    Equipment Recommendations  None recommended by OT    Recommendations for Other Services PT consult     Precautions / Restrictions Precautions Precautions: Knee Precaution Comments: Reviewed resting with knee in extension Required Braces or Orthoses: Knee Immobilizer - Right Knee Immobilizer - Right: Other (comment)(until discontinued ) Restrictions Weight Bearing Restrictions: Yes RLE Weight Bearing: Weight bearing as tolerated      Mobility Bed Mobility Overal bed mobility: Needs Assistance Bed Mobility: Supine to Sit     Supine to sit: Supervision     General bed mobility comments: Educated pt on use of sheet for lifting RLE. Pt demosntarting understanding and required only increased time for bed mobility.  Transfers Overall transfer level: Needs assistance Equipment used: Rolling walker (2 wheeled) Transfers: Sit to/from [employer] Sit to Stand: Min guard         General transfer comment: Min Guard for safety    Balance Overall  balance assessment: Needs assistance Sitting-balance support: No upper extremity supported;Feet supported Sitting balance-Leahy Scale: Good     Standing balance support: Bilateral upper extremity supported;During functional activity Standing balance-Leahy Scale: Poor Standing balance comment: Reliant on BUE support.                            ADL either performed or assessed with clinical judgement   ADL Overall ADL's : Needs assistance/impaired Eating/Feeding: Set up;Sitting   Grooming: Set up;Sitting   Upper Body Bathing: Set up;Sitting;Supervision/ safety   Lower Body Bathing: Minimal assistance;Sit to/from stand   Upper Body Dressing : Set up;Supervision/safety;Sitting   Lower Body Dressing: Minimal assistance;Sit to/from stand Lower Body Dressing Details (indicate cue type and reason): Min A for donning pants. Educated pt on compensatory techniques. Pt demonstrating understanding. Requiring Min A to assist with donning over R foot. Pt requiring assistance for donning socks Toilet Transfer: Min guard;Ambulation;RW(Simulated to recliner)         Tub/Shower Transfer Details (indicate cue type and reason): Will need further education on tub trasnfer Functional mobility during ADLs: Min guard;Rolling walker(short distance) General ADL Comments: Pt requiring Min A for LB ADLs. Provided eudcation on LB ADLs and safe transfer techniques. Will need further OT for tub transfer. Feel pt will progress well with time     Vision         Perception     Praxis      Pertinent Vitals/Pain Pain Assessment: 0-10 Pain Score: 8  Pain Location:  R knee  Pain Descriptors / Indicators: Aching;Operative site guarding Pain Intervention(s):  Monitored during session;Limited activity within patient's tolerance;Repositioned;Ice applied     Hand Dominance Right   Extremity/Trunk Assessment Upper Extremity Assessment Upper Extremity Assessment: Overall WFL for tasks assessed    Lower Extremity Assessment Lower Extremity Assessment: Defer to PT evaluation RLE Deficits / Details: Sensory in tact. Deficits consistent with post op pain and weakness. Able to perform ther ex below.    Cervical / Trunk Assessment Cervical / Trunk Assessment: Normal   Communication Communication Communication: No difficulties   Cognition Arousal/Alertness: Awake/alert Behavior During Therapy: WFL for tasks assessed/performed Overall Cognitive Status: Within Functional Limits for tasks assessed                                     General Comments  Educated pt on importance of icing knee and resting in extension    Exercises     Shoulder Instructions      Home Living Family/patient expects to be discharged to:: Private residence Living Arrangements: Alone Available Help at Discharge: Family;Friend(s);Available 24 hours/day Type of Home: House Home Access: Stairs to enter [employer] of Steps: 1 Entrance Stairs-Rails: None Home Layout: One level     Bathroom Shower/Tub: Chief Strategy Officer: Handicapped height     Home Equipment: Environmental consultant - 2 wheels;Bedside commode;Other (comment)(CPM )   Additional Comments: Reports son will stay at night and friends will stay during the day.       Prior Functioning/Environment Level of Independence: Independent                 OT Problem List: Decreased strength;Decreased range of motion;Decreased activity tolerance;Impaired balance (sitting and/or standing);Decreased knowledge of use of DME or AE;Decreased knowledge of precautions;Pain      OT Treatment/Interventions: Self-care/ADL training;Therapeutic exercise;DME and/or AE instruction;Energy conservation;Therapeutic activities;Patient/family education    OT Goals(Current goals can be found in the care plan section) Acute Rehab OT Goals Patient Stated Goal: to go home tomorrow  OT Goal Formulation: With patient Time For Goal  Achievement: 09/04/17 Potential to Achieve Goals: Good ADL Goals Pt Will Perform Lower Body Dressing: with set-up;with supervision;sit to/from stand Pt Will Transfer to Toilet: with set-up;with supervision;ambulating;regular height toilet Pt Will Perform Tub/Shower Transfer: Tub transfer;3 in 1;rolling walker;ambulating;with min guard assist  OT Frequency: Min 2X/week   Barriers to D/C:            Co-evaluation              AM-PAC PT "6 Clicks" Daily Activity     Outcome Measure Help from another person eating meals?: None Help from another person taking care of personal grooming?: None Help from another person toileting, which includes using toliet, bedpan, or urinal?: A Little Help from another person bathing (including washing, rinsing, drying)?: A Little Help from another person to put on and taking off regular upper body clothing?: None Help from another person to put on and taking off regular lower body clothing?: A Little 6 Click Score: 21   End of Session Equipment Utilized During Treatment: Rolling walker Nurse Communication: Mobility status  Activity Tolerance: Patient tolerated treatment well;Patient limited by pain Patient left: in chair;with call bell/phone within reach;with family/visitor present  OT Visit Diagnosis: Unsteadiness on feet (R26.81);Other abnormalities of gait and mobility (R26.89);Muscle weakness (generalized) (M62.81);Pain Pain - Right/Left: Right Pain - part of body: Knee  Time: [id number] OT Time Calculation (min): 17 min Charges:  OT General Charges $OT Visit: 1 Visit OT Evaluation $OT Eval Low Complexity: 1 Low G-Codes:     Shawndale Kilpatrick MSOT, OTR/L Acute Rehab Pager: [phone] Office: [phone]  Theodoro GristCharis M Tanairi Cypert 08/21/2017, 11:14 AM

## 2017-08-21 NOTE — Progress Notes (Signed)
Physical Therapy Treatment Patient Details Name: Adam RainwaterHunter Lagan MRN: [id number] DOB: [birth year] Today's Date: 08/21/2017    History of Present Illness Pt is a 57 y/o male s/p elective R TKA. PMH includes HTN, depression, and R rotator cuff repair.     PT Comments    Patient was led through R LE exercises and demonstrated improved ROM from previous session (8-55). HEP handout and frequency reviewed with pt. Continue to progress as tolerated.    Follow Up Recommendations  DC plan and follow up therapy as arranged by surgeon;Supervision for mobility/OOB     Equipment Recommendations  None recommended by PT    Recommendations for Other Services       Precautions / Restrictions Precautions Precautions: Knee Precaution Comments: precautions/positioning reviewed with pt Required Braces or Orthoses: Knee Immobilizer - Right Knee Immobilizer - Right: Other (comment)(until discontinued ) Restrictions Weight Bearing Restrictions: Yes RLE Weight Bearing: Weight bearing as tolerated    Mobility  Bed Mobility Overal bed mobility: Modified Independent Bed Mobility: Supine to Sit;Sit to Supine           General bed mobility comments: increased time and effort  Transfers                    Ambulation/Gait                 Stairs            Wheelchair Mobility    Modified Rankin (Stroke Patients Only)       Balance Overall balance assessment: Needs assistance Sitting-balance support: No upper extremity supported;Feet supported Sitting balance-Leahy Scale: Good                                      Cognition Arousal/Alertness: Awake/alert Behavior During Therapy: WFL for tasks assessed/performed Overall Cognitive Status: Within Functional Limits for tasks assessed                                        Exercises Total Joint Exercises Ankle Circles/Pumps: AROM;Both;10 reps Quad Sets: AROM;Both;10 reps Short Arc  Quad: AROM;Right;10 reps Heel Slides: AAROM;Right;10 reps Hip ABduction/ADduction: AROM;Right;10 reps Straight Leg Raises: AROM;AAROM;Right;10 reps Long Arc Quad: AAROM;Right;5 reps;Seated Knee Flexion: AROM;Right;5 reps;Seated;Other (comment)(10 sec holds) Goniometric ROM: 8-55    General Comments        Pertinent Vitals/Pain Pain Assessment: Faces Faces Pain Scale: Hurts even more Pain Location:  R knee/thigh Pain Descriptors / Indicators: Operative site guarding;Aching;Cramping Pain Intervention(s): Limited activity within patient's tolerance;Monitored during session;Premedicated before session;Repositioned    Home Living                      Prior Function            PT Goals (current goals can now be found in the care plan section) Acute Rehab PT Goals Patient Stated Goal: to go home tomorrow  PT Goal Formulation: With patient Time For Goal Achievement: 08/27/17 Potential to Achieve Goals: Good Progress towards PT goals: Progressing toward goals    Frequency    7X/week      PT Plan Current plan remains appropriate    Co-evaluation              AM-PAC PT "6 Clicks" Daily Activity  Outcome Measure  Difficulty  turning over in bed (including adjusting bedclothes, sheets and blankets)?: None Difficulty moving from lying on back to sitting on the side of the bed? : A Lot Difficulty sitting down on and standing up from a chair with arms (e.g., wheelchair, bedside commode, etc,.)?: Unable Help needed moving to and from a bed to chair (including a wheelchair)?: A Little Help needed walking in hospital room?: A Little Help needed climbing 3-5 steps with a railing? : A Little 6 Click Score: 16    End of Session Equipment Utilized During Treatment: Gait belt Activity Tolerance: Patient tolerated treatment well Patient left: with call bell/phone within reach;in bed Nurse Communication: Mobility status PT Visit Diagnosis: Other abnormalities of gait  and mobility (R26.89);Pain Pain - Right/Left: Right Pain - part of body: Knee     Time: 1530-1600 PT Time Calculation (min) (ACUTE ONLY): 30 min  Charges:  $Therapeutic Exercise: 23-37 mins                    G Codes:       Erline Levine, PTA Pager: [phone]     Carolynne Edouard 08/21/2017, 4:04 PM

## 2017-08-21 NOTE — Progress Notes (Signed)
Physical Therapy Treatment Patient Details Name: Adam Harvey MRN: [id number] DOB: 1960/10/26 Today's Date: 08/21/2017    History of Present Illness Pt is a 57 y/o male s/p elective R TKA. PMH includes HTN, depression, and R rotator cuff repair.     PT Comments    Patient is progressing toward mobility goals. Pt tolerated gait and stair training well. HEP next session. Current plan remains appropriate.    Follow Up Recommendations  DC plan and follow up therapy as arranged by surgeon;Supervision for mobility/OOB     Equipment Recommendations  None recommended by PT    Recommendations for Other Services       Precautions / Restrictions Precautions Precautions: Knee Precaution Comments: precautions/positioning reviewed with pt Required Braces or Orthoses: Knee Immobilizer - Right Knee Immobilizer - Right: Other (comment)(until discontinued ) Restrictions Weight Bearing Restrictions: Yes RLE Weight Bearing: Weight bearing as tolerated    Mobility  Bed Mobility Overal bed mobility: Needs Assistance Bed Mobility: Supine to Sit     Supine to sit: Supervision     General bed mobility comments: pt OOB in chair upon arrival  Transfers Overall transfer level: Needs assistance Equipment used: Rolling walker (2 wheeled) Transfers: Sit to/from [employer];Stand Pivot Transfers Sit to Stand: Min guard         General transfer comment: min guard for safety; cues for safe hand placement  Ambulation/Gait Ambulation/Gait assistance: Min guard Ambulation Distance (Feet): 185 Feet(total with seated rest break) Assistive device: Rolling walker (2 wheeled) Gait Pattern/deviations: Step-through pattern;Decreased stance time - right;Decreased weight shift to right;Decreased step length - left;Decreased step length - right Gait velocity: decreased   General Gait Details: cues for sequencing and posture; one seated rest break   Stairs Stairs: Yes   Stair Management: No rails;Step  to pattern;Backwards;With walker Number of Stairs: 1 General stair comments: cues for sequencing and technique; assist to stabilize RW  Wheelchair Mobility    Modified Rankin (Stroke Patients Only)       Balance Overall balance assessment: Needs assistance Sitting-balance support: No upper extremity supported;Feet supported Sitting balance-Leahy Scale: Good     Standing balance support: Bilateral upper extremity supported;During functional activity Standing balance-Leahy Scale: Poor Standing balance comment: Reliant on BUE support.                             Cognition Arousal/Alertness: Awake/alert Behavior During Therapy: WFL for tasks assessed/performed Overall Cognitive Status: Within Functional Limits for tasks assessed                                        Exercises Total Joint Exercises Knee Flexion: AROM;Right;5 reps;Seated;Other (comment)(10 sec holds)    General Comments General comments (skin integrity, edema, etc.): Educated pt on importance of icing knee and resting in extension      Pertinent Vitals/Pain Pain Assessment: Faces Pain Score: 8  Faces Pain Scale: Hurts even more Pain Location:  R knee  Pain Descriptors / Indicators: Aching;Operative site guarding Pain Intervention(s): Monitored during session;Premedicated before session;Repositioned;Ice applied    Home Living Family/patient expects to be discharged to:: Private residence Living Arrangements: Alone Available Help at Discharge: Family;Friend(s);Available 24 hours/day Type of Home: House Home Access: Stairs to enter Entrance Stairs-Rails: None Home Layout: One level Home Equipment: Environmental consultantWalker - 2 wheels;Bedside commode;Other (comment)(CPM ) Additional Comments: Reports son will stay at night and  friends will stay during the day.     Prior Function Level of Independence: Independent          PT Goals (current goals can now be found in the care plan section)  Acute Rehab PT Goals Patient Stated Goal: to go home tomorrow  PT Goal Formulation: With patient Time For Goal Achievement: 08/27/17 Potential to Achieve Goals: Good Progress towards PT goals: Progressing toward goals    Frequency    7X/week      PT Plan Current plan remains appropriate    Co-evaluation              AM-PAC PT "6 Clicks" Daily Activity  Outcome Measure  Difficulty turning over in bed (including adjusting bedclothes, sheets and blankets)?: None Difficulty moving from lying on back to sitting on the side of the bed? : Unable Difficulty sitting down on and standing up from a chair with arms (e.g., wheelchair, bedside commode, etc,.)?: Unable Help needed moving to and from a bed to chair (including a wheelchair)?: A Little Help needed walking in hospital room?: A Little Help needed climbing 3-5 steps with a railing? : A Little 6 Click Score: 15    End of Session Equipment Utilized During Treatment: Gait belt;Right knee immobilizer Activity Tolerance: Patient limited by pain Patient left: in chair;with call bell/phone within reach;with family/visitor present Nurse Communication: Mobility status PT Visit Diagnosis: Other abnormalities of gait and mobility (R26.89);Pain Pain - Right/Left: Right Pain - part of body: Knee     Time: 4098-1191 PT Time Calculation (min) (ACUTE ONLY): 23 min  Charges:  $Gait Training: 23-37 mins                    G Codes:       Erline Levine, PTA Pager: [phone]     Carolynne Edouard 08/21/2017, 1:34 PM

## 2017-08-21 NOTE — Progress Notes (Signed)
Patient ID: Adam Harvey, male   DOB: November 27, 1960, 57 y.o.   MRN: [id number]     Subjective:  Patient reports pain as mild.  Patient in bed and in no acute distress. No CP or SOB  Objective:   VITALS:   Vitals:   08/20/17 1538 08/20/17 1900 08/21/17 0006 08/21/17 0300  BP: (!) 141/87 137/75 (!) 142/74 127/66  Pulse: 70 70 76 76  Resp: 18 18 16 16   Temp: 98.5 F (36.9 C) 99 F (37.2 C) 99 F (37.2 C) 98.6 F (37 C)  TempSrc: Oral Oral Oral Oral  SpO2: 95% 95% 98% 97%  Weight:      Height:        ABD soft Sensation intact distally Dorsiflexion/Plantar flexion intact Incision: dressing C/D/I and no drainage   Lab Results  Component Value Date   WBC 12.0 (H) 08/21/2017   HGB 12.5 (L) 08/21/2017   HCT 36.9 (L) 08/21/2017   MCV 90.0 08/21/2017   PLT 198 08/21/2017   BMET    Component Value Date/Time   NA 129 (L) 08/21/2017 0519   K 4.2 08/21/2017 0519   CL 93 (L) 08/21/2017 0519   CO2 26 08/21/2017 0519   GLUCOSE 139 (H) 08/21/2017 0519   BUN 18 08/21/2017 0519   CREATININE 0.99 08/21/2017 0519   CALCIUM 8.5 (L) 08/21/2017 0519   GFRNONAA >60 08/21/2017 0519   GFRAA >60 08/21/2017 0519     Assessment/Plan: 1 Day Post-Op   Active Problems:   Primary localized osteoarthritis of right knee   Advance diet Up with therapy Plan for discharge tomorrow WBAT right lower ext. Plan for dressing change tomorrow   Haskel KhanDOUGLAS PARRY, BRANDON 08/21/2017, 10:45 AM  Discussed and agree with above.  Teryl LucyJoshua Ilianna Bown, MD Cell [phone]

## 2017-08-22 LAB — CBC
HCT: 33.7 % — ABNORMAL LOW (ref 39.0–52.0)
Hemoglobin: 11.6 g/dL — ABNORMAL LOW (ref 13.0–17.0)
MCH: 30.8 pg (ref 26.0–34.0)
MCHC: 34.4 g/dL (ref 30.0–36.0)
MCV: 89.4 fL (ref 78.0–100.0)
Platelets: 204 10*3/uL (ref 150–400)
RBC: 3.77 MIL/uL — ABNORMAL LOW (ref 4.22–5.81)
RDW: 13.3 % (ref 11.5–15.5)
WBC: 13.8 10*3/uL — ABNORMAL HIGH (ref 4.0–10.5)

## 2017-08-22 MED ORDER — SODIUM CHLORIDE 0.9 % IV BOLUS (SEPSIS)
500.0000 mL | Freq: Once | INTRAVENOUS | Status: AC
  Administered 2017-08-22: 500 mL via INTRAVENOUS

## 2017-08-22 NOTE — Progress Notes (Signed)
Physical Therapy Treatment Patient Details Name: Adam Harvey MRN: [id number] DOB: 07-11-1961 Today's Date: 08/22/2017    History of Present Illness Pt is a 57 y/o male s/p elective R TKA. PMH includes HTN, depression, and R rotator cuff repair.     PT Comments    Continuing work on functional mobility and activity tolerance;  Noting slowly improving knee flexion ROM with seated knee flexion; Walked functional household distance, however limited by dizziness, ears ringing -- sat down, then needed to lay down; Orthostatic BPs taken, which revealed significant Orthostatic Hypotension; RN notified (see general Comments for BPs); Given hypotension, at this point Adam Harvey is not safe for DC home.   Follow Up Recommendations  DC plan and follow up therapy as arranged by surgeon;Supervision for mobility/OOB     Equipment Recommendations  None recommended by PT    Recommendations for Other Services       Precautions / Restrictions Precautions Precautions: Knee Precaution Booklet Issued: Yes (comment) Precaution Comments: precautions/positioning reviewed with pt Required Braces or Orthoses: Knee Immobilizer - Right Knee Immobilizer - Right: Other (comment)(until discontinued ) Restrictions RLE Weight Bearing: Weight bearing as tolerated Other Position/Activity Restrictions: Monday, 1/6: significantly Orthostatic    Mobility  Bed Mobility Overal bed mobility: Needs Assistance Bed Mobility: Supine to Sit;Sit to Supine     Supine to sit: Supervision Sit to supine: Min assist   General bed mobility comments: Min assist to elevate RLE  Transfers Overall transfer level: Needs assistance Equipment used: Rolling walker (2 wheeled) Transfers: Sit to/from Stand Sit to Stand: Min guard         General transfer comment: min guard for safety; cues for safe hand placement  Ambulation/Gait Ambulation/Gait assistance: Supervision Ambulation Distance (Feet): 90 Feet Assistive  device: Rolling walker (2 wheeled) Gait Pattern/deviations: Step-through pattern;Decreased stance time - right;Decreased weight shift to right;Decreased step length - left;Decreased step length - right     General Gait Details: Cues for sequence and posture, as well as to activate R quad for stance stabiltiy   Stairs            Wheelchair Mobility    Modified Rankin (Stroke Patients Only)       Balance             Standing balance-Leahy Scale: Poor Standing balance comment: Orthostatic hypotension this session                            Cognition Arousal/Alertness: Awake/alert Behavior During Therapy: WFL for tasks assessed/performed Overall Cognitive Status: Within Functional Limits for tasks assessed                                        Exercises Total Joint Exercises Straight Leg Raises: AROM;10 reps;Right Knee Flexion: AAROM;Right;Seated(3) Goniometric ROM: 8-72    General Comments General comments (skin integrity, edema, etc.):    08/22/17 0800 08/22/17 0805 08/22/17 0830  Vital Signs  Patient Position (if appropriate) Orthostatic Vitals --  --   Orthostatic Lying   BP- Lying 133/72 --  115/64  Pulse- Lying 76 --  81  Orthostatic Sitting  BP- Sitting (!) 79/54 (!) 82/47 --   Pulse- Sitting 82 79 --   Orthostatic Standing at 0 minutes  BP- Standing at 0 minutes --  (!) 70/53 --   Pulse- Standing at 0 minutes --  79 --   Orthostatic Standing at 3 minutes  BP- Standing at 3 minutes --  (Unable to stand long enough for reading) --          Pertinent Vitals/Pain Pain Assessment: 0-10 Pain Score: 9 (when activating quad against gravity) Pain Location:  R knee/thigh; subsides back down to a 6 with rest Pain Descriptors / Indicators: Operative site guarding;Aching;Cramping Pain Intervention(s): Monitored during session    Home Living                      Prior Function            PT Goals (current goals  can now be found in the care plan section) Acute Rehab PT Goals Patient Stated Goal: To go home; wants to be confident going home PT Goal Formulation: With patient Time For Goal Achievement: 08/27/17 Potential to Achieve Goals: Good Progress towards PT goals: Progressing toward goals    Frequency    7X/week      PT Plan Current plan remains appropriate    Co-evaluation              AM-PAC PT "6 Clicks" Daily Activity  Outcome Measure  Difficulty turning over in bed (including adjusting bedclothes, sheets and blankets)?: None Difficulty moving from lying on back to sitting on the side of the bed? : A Lot Difficulty sitting down on and standing up from a chair with arms (e.g., wheelchair, bedside commode, etc,.)?: A Lot Help needed moving to and from a bed to chair (including a wheelchair)?: A Little Help needed walking in hospital room?: A Little Help needed climbing 3-5 steps with a railing? : A Little 6 Click Score: 17    End of Session   Activity Tolerance: Other (comment)(Limited by hypotension) Patient left: with call bell/phone within reach;in bed Nurse Communication: Mobility status;Other (comment)(postural hypotension) PT Visit Diagnosis: Other abnormalities of gait and mobility (R26.89);Pain Pain - Right/Left: Right Pain - part of body: Knee     Time: 0751-0820 PT Time Calculation (min) (ACUTE ONLY): 29 min  Charges:  $Gait Training: 8-22 mins $Therapeutic Activity: 8-22 mins                    G Codes:       Adam Harvey, PT  Acute Rehabilitation Services Pager [phone] Office [phone]    Levi AlandHolly H Eulon Harvey 08/22/2017, 8:40 AM

## 2017-08-22 NOTE — Progress Notes (Signed)
Physical Therapy Treatment Patient Details Name: Adam Harvey MRN: [id number] DOB: 1961/03/05 Today's Date: 08/22/2017    History of Present Illness Pt is a 57 y/o male s/p elective R TKA. PMH includes HTN, depression, and R rotator cuff repair.     PT Comments    Continuing work on functional mobility and activity tolerance;  Session focused on re-taking orthostatics which were as follows:     08/22/17 1605  Orthostatic Lying   BP- Lying 133/73  Pulse- Lying 75  Orthostatic Sitting  BP- Sitting 111/64  Pulse- Sitting 88  Orthostatic Standing at 0 minutes  BP- Standing at 0 minutes 99/65  Pulse- Standing at 0 minutes 88  Orthostatic Standing at 3 minutes  BP- Standing at 3 minutes 112/71  Pulse- Standing at 3 minutes 92   Still symptomatic,  But able to stand long enough to get 3 minute standing BP, which was promising  Follow Up Recommendations  DC plan and follow up therapy as arranged by surgeon;Supervision for mobility/OOB     Equipment Recommendations  None recommended by PT    Recommendations for Other Services       Precautions / Restrictions Precautions Precautions: Knee Precaution Booklet Issued: Yes (comment) Precaution Comments: precautions/positioning reviewed with pt Required Braces or Orthoses: Knee Immobilizer - Right Knee Immobilizer - Right: Other (comment)(until discontinued ) Restrictions RLE Weight Bearing: Weight bearing as tolerated Other Position/Activity Restrictions: Monday, 1/6: significantly Orthostatic    Mobility  Bed Mobility Overal bed mobility: Needs Assistance Bed Mobility: Supine to Sit;Sit to Supine     Supine to sit: Supervision Sit to supine: Min assist   General bed mobility comments: Min assist to elevate RLE  Transfers Overall transfer level: Needs assistance Equipment used: Rolling walker (2 wheeled) Transfers: Sit to/from Stand Sit to Stand: Min guard         General transfer comment: min guard for safety;  cues for safe hand placement  Ambulation/Gait                 Stairs            Wheelchair Mobility    Modified Rankin (Stroke Patients Only)       Balance                                            Cognition Arousal/Alertness: Awake/alert Behavior During Therapy: WFL for tasks assessed/performed Overall Cognitive Status: Within Functional Limits for tasks assessed                                        Exercises Total Joint Exercises Goniometric ROM: Knee extension to 7 deg shy of full extension; Pt tells me preop he could not get into full extension    General Comments        Pertinent Vitals/Pain Pain Assessment: Faces Faces Pain Scale: Hurts even more Pain Location: R knee/thigh Pain Descriptors / Indicators: Operative site guarding;Aching;Cramping Pain Intervention(s): Monitored during session    Home Living                      Prior Function            PT Goals (current goals can now be found in the care plan section) Acute Rehab PT  Goals Patient Stated Goal: To go home; wants to be confident going home PT Goal Formulation: With patient Time For Goal Achievement: 08/27/17 Potential to Achieve Goals: Good Progress towards PT goals: Progressing toward goals    Frequency    7X/week      PT Plan Current plan remains appropriate    Co-evaluation              AM-PAC PT "6 Clicks" Daily Activity  Outcome Measure  Difficulty turning over in bed (including adjusting bedclothes, sheets and blankets)?: None Difficulty moving from lying on back to sitting on the side of the bed? : A Little Difficulty sitting down on and standing up from a chair with arms (e.g., wheelchair, bedside commode, etc,.)?: A Little Help needed moving to and from a bed to chair (including a wheelchair)?: A Little Help needed walking in hospital room?: A Little Help needed climbing 3-5 steps with a railing? : A  Little 6 Click Score: 19    End of Session   Activity Tolerance: Other (comment)(Limited by hypotension) Patient left: with call bell/phone within reach;in bed Nurse Communication: Mobility status;Other (comment) PT Visit Diagnosis: Other abnormalities of gait and mobility (R26.89);Pain Pain - Right/Left: Right Pain - part of body: Knee     Time: 1610-9604 PT Time Calculation (min) (ACUTE ONLY): 21 min  Charges:  $Therapeutic Activity: 8-22 mins                    G Codes:       Van Clines, PT  Acute Rehabilitation Services Pager [phone] Office [phone]    Adam Harvey 08/22/2017, 5:52 PM

## 2017-08-22 NOTE — Progress Notes (Signed)
Patient had an episode of orthostatic hypotension with physical therapy, will give him an additional bolus of fluid, hold his discharge, probable discharge tomorrow.  Will reassess with therapy as he gets up after getting more IV fluids.

## 2017-08-22 NOTE — Progress Notes (Signed)
Notified on call, Dr. Dion SaucierLandau.  During am PT session RN notified by physical therapist that the patient experienced decrease in blood pressure.  BP decrease also accompanied by symptoms:  nausea, sweating and dizziness.    Pt alert and oriented, placed in recliner and returned to room.    Orders to give 500cc NS bolus, continue IV fluids as ordered and hold discharge for today.

## 2017-08-22 NOTE — Progress Notes (Signed)
Patient ID: Adam Harvey, male   DOB: 14-Nov-1960, 57 y.o.   MRN: [id number]     Subjective:  Patient reports pain as mild.  Patient denies any CP or SOB but did get hypotensive with PT  Objective:   VITALS:   Vitals:   08/21/17 1424 08/21/17 2129 08/22/17 0501 08/22/17 0958  BP: 136/74 (!) 150/70 112/62 (!) 143/67  Pulse: 65 70 69   Resp:  16 16   Temp: 98.9 F (37.2 C) 99.6 F (37.6 C) 99.3 F (37.4 C)   TempSrc: Oral Oral Oral   SpO2: 97% 98% 96%   Weight:      Height:        ABD soft Sensation intact distally Dorsiflexion/Plantar flexion intact Incision: dressing C/D/I and no drainage Dressing changed and wound good  Lab Results  Component Value Date   WBC 13.8 (H) 08/22/2017   HGB 11.6 (L) 08/22/2017   HCT 33.7 (L) 08/22/2017   MCV 89.4 08/22/2017   PLT 204 08/22/2017   BMET    Component Value Date/Time   NA 129 (L) 08/21/2017 0519   K 4.2 08/21/2017 0519   CL 93 (L) 08/21/2017 0519   CO2 26 08/21/2017 0519   GLUCOSE 139 (H) 08/21/2017 0519   BUN 18 08/21/2017 0519   CREATININE 0.99 08/21/2017 0519   CALCIUM 8.5 (L) 08/21/2017 0519   GFRNONAA >60 08/21/2017 0519   GFRAA >60 08/21/2017 0519     Assessment/Plan: 2 Days Post-Op   Active Problems:   Primary localized osteoarthritis of right knee   Advance diet Up with therapy Plan for discharge tomorrow Continue to monitor BP Dr Christian Mateaffery will follow tomorrow   Haskel KhanDOUGLAS PARRY, BRANDON 08/22/2017, 1:09 PM  Discussed and agree with above.  Increased IV fluids and ordered a bolus for his orthostatic hypotension.  Teryl LucyJoshua Kathie Posa, MD Cell [phone]

## 2017-08-23 ENCOUNTER — Encounter (HOSPITAL_COMMUNITY): Payer: Self-pay | Admitting: General Practice

## 2017-08-23 ENCOUNTER — Other Ambulatory Visit: Payer: Self-pay

## 2017-08-23 LAB — CBC
HCT: 30.7 % — ABNORMAL LOW (ref 39.0–52.0)
Hemoglobin: 10.3 g/dL — ABNORMAL LOW (ref 13.0–17.0)
MCH: 30 pg (ref 26.0–34.0)
MCHC: 33.6 g/dL (ref 30.0–36.0)
MCV: 89.5 fL (ref 78.0–100.0)
Platelets: 196 10*3/uL (ref 150–400)
RBC: 3.43 MIL/uL — ABNORMAL LOW (ref 4.22–5.81)
RDW: 13.5 % (ref 11.5–15.5)
WBC: 8.3 10*3/uL (ref 4.0–10.5)

## 2017-08-23 NOTE — Progress Notes (Signed)
Occupational Therapy Treatment Patient Details Name: Adam Harvey MRN: [id number] DOB: Nov 23, 1960 Today's Date: 08/23/2017    History of present illness Pt is a 57 y/o male s/p elective R TKA. PMH includes HTN, depression, and R rotator cuff repair.    OT comments  Pt demonstrates orthostatic BP during session with symptoms. Pt advised to sit and sponge bath while symptoms are present. Pt agreeable. Pt advised to complete exercises and maintain knee extension throughout the day. Pt very concerned with BP changes and symptoms of ringing in ears.  Orthostatic BPs  Supine 143/75  Sitting 157/73     Standing 186/81  Standing after 3 min with movement 134/77     Follow Up Recommendations  No OT follow up;Supervision/Assistance - 24 hour    Equipment Recommendations  None recommended by OT    Recommendations for Other Services PT consult    Precautions / Restrictions Precautions Precautions: Knee Precaution Comments: reeducated on knee extension- pt with knee flexed to bed on arrival . pt educated on blue block. pt requesting to use yellow foam instead after education.  Restrictions Weight Bearing Restrictions: Yes RLE Weight Bearing: Weight bearing as tolerated       Mobility Bed Mobility Overal bed mobility: Needs Assistance Bed Mobility: Supine to Sit;Sit to Supine     Supine to sit: Supervision Sit to supine: Supervision   General bed mobility comments: able to progress to EOB  Transfers Overall transfer level: Needs assistance Equipment used: Rolling walker (2 wheeled) Transfers: Sit to/from Stand Sit to Stand: Supervision              Balance Overall balance assessment: Needs assistance   Sitting balance-Leahy Scale: Good       Standing balance-Leahy Scale: Good                             ADL either performed or assessed with clinical judgement   ADL Overall ADL's : Needs assistance/impaired Eating/Feeding: Modified independent    Grooming: Wash/dry face   Upper Body Bathing: Modified independent   Lower Body Bathing: Modified independent       Lower Body Dressing: Modified independent Lower Body Dressing Details (indicate cue type and reason): able to reach feet and verbalized correct sequence dressing R LE first.  Toilet Transfer: Supervision/safety         Tub/Shower Transfer Details (indicate cue type and reason): educated to sponge bath only at this time due to symptoms and dizziness Functional mobility during ADLs: Min guard;Rolling walker General ADL Comments: Pt able to progress from supine to EOB and moving well with RW this session but BP continues to vary with symptoms. pt verbalized increase ringing in ears with movement and dizziness.  Pt very concerned with changes in BP and asking multiple questions wanting to know "why " is this happening     Vision       Perception     Praxis      Cognition Arousal/Alertness: Awake/alert Behavior During Therapy: WFL for tasks assessed/performed Overall Cognitive Status: Within Functional Limits for tasks assessed                                          Exercises Exercises: Total Joint Total Joint Exercises Ankle Circles/Pumps: AROM;Right;10 reps;Seated   Shoulder Instructions       General Comments educated  on bathing/ dressing and need to use clean linen each shower and avoiding washing directly on the cut    Pertinent Vitals/ Pain       Pain Assessment: 0-10 Pain Score: 5  Pain Location: R knee Pain Descriptors / Indicators: Operative site guarding Pain Intervention(s): Monitored during session;Premedicated before session;Repositioned  Home Living                                          Prior Functioning/Environment              Frequency  Min 2X/week        Progress Toward Goals  OT Goals(current goals can now be found in the care plan section)  Progress towards OT goals:  Progressing toward goals  Acute Rehab OT Goals Patient Stated Goal: To go home; wants to be confident going home OT Goal Formulation: With patient Time For Goal Achievement: 09/04/17 Potential to Achieve Goals: Good ADL Goals Pt Will Perform Lower Body Dressing: with set-up;with supervision;sit to/from stand Pt Will Transfer to Toilet: with set-up;with supervision;ambulating;regular height toilet Pt Will Perform Tub/Shower Transfer: Tub transfer;3 in 1;rolling walker;ambulating;with min guard assist  Plan Discharge plan remains appropriate    Co-evaluation                 AM-PAC PT "6 Clicks" Daily Activity     Outcome Measure   Help from another person eating meals?: None Help from another person taking care of personal grooming?: None Help from another person toileting, which includes using toliet, bedpan, or urinal?: A Little Help from another person bathing (including washing, rinsing, drying)?: A Little Help from another person to put on and taking off regular upper body clothing?: None Help from another person to put on and taking off regular lower body clothing?: A Little 6 Click Score: 21    End of Session Equipment Utilized During Treatment: Rolling walker  OT Visit Diagnosis: Unsteadiness on feet (R26.81);Other abnormalities of gait and mobility (R26.89);Muscle weakness (generalized) (M62.81);Pain Pain - Right/Left: Right Pain - part of body: Knee   Activity Tolerance Patient tolerated treatment well;Patient limited by pain   Patient Left in bed;with call bell/phone within reach   Nurse Communication Mobility status        Time: 1610-9604 OT Time Calculation (min): 38 min  Charges: OT General Charges $OT Visit: 1 Visit OT Treatments $Self Care/Home Management : 38-52 mins   Mateo Flow   OTR/L Pager: [phone] Office: [phone] .    Boone Master B 08/23/2017, 9:47 AM

## 2017-08-23 NOTE — Progress Notes (Signed)
PT Cancellation Note  Patient Details Name: Adam Harvey MRN: [id number] DOB: 05-11-1961   Cancelled Treatment:        Attempted PT session, however pt requesting pain meds before proceeding; Discussed with RN; Will plan to return close to noon, 30 minutes after pain meds are given (around 11:30)  Van ClinesHolly Nolberto Cheuvront, PT  Acute Rehabilitation Services Pager [phone] Office [phone]    Adam Harvey 08/23/2017, 11:12 AM

## 2017-08-23 NOTE — Discharge Summary (Signed)
PATIENT ID: Adam Harvey        MRN:  [id number]          DOB/AGE: December 11, 1960 / 57 y.o.    DISCHARGE SUMMARY  ADMISSION DATE:    08/20/2017 DISCHARGE DATE:   08/23/2017   ADMISSION DIAGNOSIS: OA RIGHT KNEE    DISCHARGE DIAGNOSIS:  OSTEOARTHRITIS RIGHT KNEE    ADDITIONAL DIAGNOSIS: Active Problems:   Primary localized osteoarthritis of right knee  Past Medical History:  Diagnosis Date  . Alcoholism (HCC)    sober since 01-2011   . Anxiety   . Arthritis   . Depression   . GERD (gastroesophageal reflux disease)   . Hemorrhoids   . Hyperlipidemia   . Hypertension   . Obsessive compulsive disorder   . Osteoarthritis   . Tear of right rotator cuff 08/03/2014  . Wears contact lenses     PROCEDURE: Procedure(s): TOTAL KNEE ARTHROPLASTY Right on 08/20/2017  CONSULTS: PT/OT    HISTORY:  See H&P in chart  HOSPITAL COURSE:  Adam Harvey is a 57 y.o. admitted on 08/20/2017 and found to have a diagnosis of OSTEOARTHRITIS RIGHT KNEE.  After appropriate laboratory studies were obtained  they were taken to the operating room on 08/20/2017 and underwent  Procedure(s): TOTAL KNEE ARTHROPLASTY  Right.   They were given perioperative antibiotics:  Anti-infectives (From admission, onward)   Start     Dose/Rate Route Frequency Ordered Stop   08/20/17 1245  ceFAZolin (ANCEF) IVPB 1 g/50 mL premix     1 g 100 mL/hr over 30 Minutes Intravenous Every 6 hours 08/20/17 1236 08/20/17 1827   08/20/17 0700  ceFAZolin (ANCEF) 3 g in dextrose 5 % 50 mL IVPB     3 g 130 mL/hr over 30 Minutes Intravenous To ShortStay Surgical 08/19/17 1454 08/20/17 0750    .  Tolerated the procedure well.  Straight cath bladder intraoperatively.     POD #1, allowed out of bed to a chair.  PT for ambulation and exercise program.   O2 discontionued.  POD #2, continued PT and ambulation.  Had episode of orthostatic hypotension with PT, given fluid bolus later tolerated better, monitored overnight.  POD#3, still  with some orthostatic hypotension with standing.  Improved with movement and after standing 3 minutes.    The remainder of the hospital course was dedicated to ambulation and strengthening.   The patient was discharged on 3 Days Post-Op in  Stable condition.  Blood products given:none  DIAGNOSTIC STUDIES: Recent vital signs:  Patient Vitals for the past 24 hrs:  BP Temp Temp src Pulse Resp SpO2  08/23/17 0826 134/77 - - - - -  08/23/17 0820 (!) 186/81 - - - - -  08/23/17 0817 (!) 157/73 - - - - -  08/23/17 0815 (!) 143/75 - - - - -  08/23/17 0521 134/78 98.1 F (36.7 C) Oral 72 16 98 %  08/22/17 2040 (!) 148/65 98.4 F (36.9 C) Oral 77 16 99 %  08/22/17 1501 (!) 145/75 - - - - -       Recent laboratory studies: Recent Labs    08/21/17 0519 08/22/17 0351 08/23/17 0849  WBC 12.0* 13.8* 8.3  HGB 12.5* 11.6* 10.3*  HCT 36.9* 33.7* 30.7*  PLT 198 204 196   Recent Labs    08/21/17 0519  NA 129*  K 4.2  CL 93*  CO2 26  BUN 18  CREATININE 0.99  GLUCOSE 139*  CALCIUM 8.5*   Lab  Results  Component Value Date   INR 1.06 08/12/2017     Recent Radiographic Studies :  Dg Chest 2 View  Result Date: 08/12/2017 CLINICAL DATA:  Pre-op respiratory exam for right knee replacement. EXAM: CHEST  2 VIEW COMPARISON:  None. FINDINGS: The heart size and mediastinal contours are within normal limits. Both lungs are clear. The visualized skeletal structures are unremarkable. IMPRESSION: No active cardiopulmonary disease. Electronically Signed   By: Myles Rosenthal M.D.   On: 08/12/2017 17:29    DISCHARGE INSTRUCTIONS:   DISCHARGE MEDICATIONS:   Allergies as of 08/23/2017   No Known Allergies     Medication List    STOP taking these medications   aspirin 81 MG tablet   baclofen 10 MG tablet Commonly known as:  LIORESAL   HYDROmorphone 2 MG tablet Commonly known as:  DILAUDID   meloxicam 15 MG tablet Commonly known as:  MOBIC   oxyCODONE-acetaminophen 10-325 MG  tablet Commonly known as:  PERCOCET   propranolol 40 MG tablet Commonly known as:  INDERAL   traMADol 50 MG tablet Commonly known as:  ULTRAM     TAKE these medications   acetaminophen 650 MG CR tablet Commonly known as:  TYLENOL Take 2 tablets (1,300 mg total) by mouth every 8 (eight) hours as needed for pain.   apixaban 2.5 MG Tabs tablet Commonly known as:  ELIQUIS Take 1 tablet (2.5 mg total) by mouth 2 (two) times daily.   celecoxib 200 MG capsule Commonly known as:  CELEBREX Take 1 capsule (200 mg total) by mouth 2 (two) times daily.   lamoTRIgine 150 MG tablet Commonly known as:  LAMICTAL Take 150 mg by mouth at bedtime.   lisinopril-hydrochlorothiazide 20-12.5 MG tablet Commonly known as:  PRINZIDE,ZESTORETIC Take 1 tablet by mouth daily.   Melatonin 10 MG Tabs Take by mouth.   methocarbamol 500 MG tablet Commonly known as:  ROBAXIN Take 1 tablet (500 mg total) by mouth every 6 (six) hours as needed for muscle spasms. What changed:  when to take this   multivitamin with minerals tablet Take 1 tablet by mouth daily.   omeprazole 20 MG tablet Commonly known as:  PRILOSEC OTC Take 20 mg by mouth daily.   ondansetron 4 MG tablet Commonly known as:  ZOFRAN Take 1 tablet (4 mg total) by mouth every 6 (six) hours as needed for nausea or vomiting. What changed:  when to take this   oxyCODONE 5 MG immediate release tablet Commonly known as:  Oxy IR/ROXICODONE 1-2 tabs po q4 hours prn pain   sennosides-docusate sodium 8.6-50 MG tablet Commonly known as:  SENOKOT-S Take 2 tablets by mouth daily.   sertraline 100 MG tablet Commonly known as:  ZOLOFT Take 200 mg by mouth daily.   simvastatin 40 MG tablet Commonly known as:  ZOCOR Take 40 mg by mouth daily.            Durable Medical Equipment  (From admission, onward)        Start     Ordered   08/20/17 1512  DME Walker rolling  Once    Question:  Patient needs a walker to treat with the  following condition  Answer:  Primary localized osteoarthritis of right knee   08/20/17 1512   08/20/17 1512  DME 3 n 1  Once     08/20/17 1512      FOLLOW UP VISIT:   Follow-up Information    Frederico Hamman, MD. Schedule an appointment as  soon as possible for a visit in 2 week(s).   Specialty:  Orthopedic Surgery Contact information: [address] [place] [address] [phone]        Geoffry ParadiseAronson, Richard, MD. Schedule an appointment as soon as possible for a visit.   Specialty:  Internal Medicine Why:  would get appt to be seen sometime this week to address BP  Contact information: [address] [place] [address] [phone]           DISPOSITION:   Home  CONDITION:  Stable   Margart SicklesJoshua Kess Mcilwain, PA-C  08/23/2017 10:35 AM

## 2017-08-23 NOTE — Op Note (Signed)
NAME:  ,                                 ACCOUNT NO.:  MEDICAL RECORD NO.:  [id number]  LOCATION:                                 FACILITY:  PHYSICIAN:  Dyke BrackettW. D. Zared Knoth, M.D.         DATE OF BIRTH:  DATE OF PROCEDURE:  08/20/2017 DATE OF DISCHARGE:                              OPERATIVE REPORT   PREOPERATIVE DIAGNOSIS:  Severe osteoarthritis, right knee with valgus deformity and flexion contracture.  POSTOPERATIVE DIAGNOSIS:  Severe osteoarthritis, right knee with valgus deformity and flexion contracture.  OPERATION: 1. Right total knee replacement. 2. Lateral release, right knee.  SURGEON:  Dyke BrackettW. D. Syair Fricker, M.D.  ASSISTANVincent Peyer:  [place], PA.  ANESTHESIA:  Spinal with general.  TOURNIQUET TIME:  84 minutes.  DESCRIPTION OF PROCEDURE:  Exsanguination of leg, inflation of tourniquet to 350.  Straight skin incision was made with medial parapatellar approach to the knee made.  Significant wear was most noted on the lateral side of the knee in the lateral tibial plateau, but all 3 compartments were severely worn.  We did an 11 mm 5-degree valgus cut on the femur followed by resecting about 4-5 mm below the most diseased medial compartment.  We had to make several provisional tibial cuts to obtain full extension, however.  Femur was sized to be a size 5. Placement of all-in-1 cutting block with anterior and posterior cuts as well as chamfer cuts.  The flexion gap and extension gap equalized at 10 mm.  We released the posterior capsule, incised the PCL.  Multiple loose bodies were removed from the posterior medial aspect of the knee as well as the posterior aspect of the knee.  Keel hole was cut for the tibia, followed __________ box for the femur.  Trial components were placed. Full extension was obtained.  There was significant lateral tracking requiring lateral release of the patella.  Patella was cut leaving about 15-16 mm of native patella for a 38 mm all poly trial.  The  trial components were placed and again all parameters deemed to be acceptable. Full extension, resolution of the flexion contracture, good alignment. No instability to varus or valgus stress testing.  Final components were inserted with antibiotic impregnated cement due to the presence of previous surgery with allowing the cement to cure with the trial bearing in.  We infiltrated the capsular structures with a mixture of Exparel and Marcaine.  Tourniquet was released after the cement hardened. Excess cement was noted before this and removed from the posterior aspect of the knee.  No excessive bleeding was noted when the tourniquet was __________ the small branch of the geniculate __________ was coagulated.  Final bearing was placed and all parameters were deemed to be acceptable.  Closure was affected with #1 Ethibond, 2-0 Vicryl, and skin clips.  Lightly compressive sterile dressing and knee immobilizer applied.     Dyke BrackettW. D. Alayjah Boehringer, M.D.     WDC/MEDQ  D:  08/20/2017  T:  08/20/2017  Job:  [id number]

## 2017-08-23 NOTE — Progress Notes (Signed)
OT called this nurse to pt's room.  Pt c/o dizziness and ringing in his ears.  Ortho team at bedside and is aware and advised this nurse to hold Celebrex.  Orthostatic Bp's in acceptable range at this time.  Will cont to monitor.  [id number]

## 2017-08-23 NOTE — Progress Notes (Signed)
Physical Therapy Treatment Patient Details Name: Adam Harvey Wigal MRN: [id number] DOB: 02/18/1961 Today's Date: 08/23/2017    History of Present Illness Pt is a 57 y/o male s/p elective R TKA. PMH includes HTN, depression, and R rotator cuff repair.     PT Comments    Continuing work on functional mobility and activity tolerance;  Much improved activity tolerance and  BP with normal response to activity and household ambulation; OK for dc home from PT standpoint   Follow Up Recommendations  DC plan and follow up therapy as arranged by surgeon;Supervision for mobility/OOB     Equipment Recommendations  None recommended by PT    Recommendations for Other Services       Precautions / Restrictions Precautions Precautions: Knee Precaution Booklet Issued: Yes (comment) Precaution Comments: Pt educated to not allow any pillow or bolster under knee for healing with optimal range of motion.  Required Braces or Orthoses: Knee Immobilizer - Right Knee Immobilizer - Right: Other (comment)(until discontinued ) Restrictions RLE Weight Bearing: Weight bearing as tolerated    Mobility  Bed Mobility Overal bed mobility: Needs Assistance Bed Mobility: Supine to Sit;Sit to Supine     Supine to sit: Modified independent (Device/Increase time) Sit to supine: Modified independent (Device/Increase time)   General bed mobility comments: Slow moving  Transfers Overall transfer level: Needs assistance Equipment used: Rolling walker (2 wheeled) Transfers: Sit to/from Stand Sit to Stand: Supervision         General transfer comment:  cues for safe hand placement  Ambulation/Gait Ambulation/Gait assistance: Supervision Ambulation Distance (Feet): 120 Feet Assistive device: Rolling walker (2 wheeled) Gait Pattern/deviations: Step-through pattern;Decreased stance time - right;Decreased weight shift to right;Decreased step length - left;Decreased step length - right Gait velocity: decreased    General Gait Details: Cues to activate quad for stance stability, and to self-monitor for activity tolerance   Stairs         General stair comments: He expressed confidence in ability to manage his steps at home  Wheelchair Mobility    Modified Rankin (Stroke Patients Only)       Balance     Sitting balance-Leahy Scale: Good       Standing balance-Leahy Scale: Good                              Cognition Arousal/Alertness: Awake/alert Behavior During Therapy: WFL for tasks assessed/performed Overall Cognitive Status: Within Functional Limits for tasks assessed                                        Exercises  Pt politely declined therapeutic exercise    General Comments        Pertinent Vitals/Pain Pain Assessment: 0-10 Pain Score: 4  Faces Pain Scale: Hurts a little bit Pain Location: R knee Pain Descriptors / Indicators: Operative site guarding Pain Intervention(s): Monitored during session    Home Living   Living Arrangements: Alone                  Prior Function            PT Goals (current goals can now be found in the care plan section) Acute Rehab PT Goals Patient Stated Goal: To go home; wants to be confident going home PT Goal Formulation: With patient Time For Goal Achievement: 08/27/17 Potential to  Achieve Goals: Good Progress towards PT goals: Progressing toward goals    Frequency    7X/week      PT Plan Current plan remains appropriate    Co-evaluation              AM-PAC PT "6 Clicks" Daily Activity  Outcome Measure  Difficulty turning over in bed (including adjusting bedclothes, sheets and blankets)?: None Difficulty moving from lying on back to sitting on the side of the bed? : A Little Difficulty sitting down on and standing up from a chair with arms (e.g., wheelchair, bedside commode, etc,.)?: A Little Help needed moving to and from a bed to chair (including a wheelchair)?:  None Help needed walking in hospital room?: None Help needed climbing 3-5 steps with a railing? : A Little 6 Click Score: 21    End of Session Equipment Utilized During Treatment: Gait belt Activity Tolerance: Patient tolerated treatment well Patient left: in bed;with call bell/phone within reach Nurse Communication: Mobility status PT Visit Diagnosis: Other abnormalities of gait and mobility (R26.89);Pain Pain - Right/Left: Right Pain - part of body: Knee     Time: 1207-1229 PT Time Calculation (min) (ACUTE ONLY): 22 min  Charges:  $Gait Training: 8-22 mins                    G Codes:       Van Clines, PT  Acute Rehabilitation Services Pager [phone] Office [phone]    Levi Aland 08/23/2017, 1:11 PM

## 2017-08-24 DIAGNOSIS — I10 Essential (primary) hypertension: Secondary | ICD-10-CM | POA: Diagnosis not present

## 2017-08-24 DIAGNOSIS — F1011 Alcohol abuse, in remission: Secondary | ICD-10-CM | POA: Diagnosis not present

## 2017-08-24 DIAGNOSIS — Z471 Aftercare following joint replacement surgery: Secondary | ICD-10-CM | POA: Diagnosis not present

## 2017-08-24 DIAGNOSIS — F419 Anxiety disorder, unspecified: Secondary | ICD-10-CM | POA: Diagnosis not present

## 2017-08-25 DIAGNOSIS — F1011 Alcohol abuse, in remission: Secondary | ICD-10-CM | POA: Diagnosis not present

## 2017-08-25 DIAGNOSIS — F419 Anxiety disorder, unspecified: Secondary | ICD-10-CM | POA: Diagnosis not present

## 2017-08-25 DIAGNOSIS — I10 Essential (primary) hypertension: Secondary | ICD-10-CM | POA: Diagnosis not present

## 2017-08-25 DIAGNOSIS — Z471 Aftercare following joint replacement surgery: Secondary | ICD-10-CM | POA: Diagnosis not present

## 2017-08-27 DIAGNOSIS — F419 Anxiety disorder, unspecified: Secondary | ICD-10-CM | POA: Diagnosis not present

## 2017-08-27 DIAGNOSIS — Z471 Aftercare following joint replacement surgery: Secondary | ICD-10-CM | POA: Diagnosis not present

## 2017-08-27 DIAGNOSIS — F1011 Alcohol abuse, in remission: Secondary | ICD-10-CM | POA: Diagnosis not present

## 2017-08-27 DIAGNOSIS — I10 Essential (primary) hypertension: Secondary | ICD-10-CM | POA: Diagnosis not present

## 2017-08-31 DIAGNOSIS — Z471 Aftercare following joint replacement surgery: Secondary | ICD-10-CM | POA: Diagnosis not present

## 2017-08-31 DIAGNOSIS — I10 Essential (primary) hypertension: Secondary | ICD-10-CM | POA: Diagnosis not present

## 2017-08-31 DIAGNOSIS — F419 Anxiety disorder, unspecified: Secondary | ICD-10-CM | POA: Diagnosis not present

## 2017-08-31 DIAGNOSIS — F1011 Alcohol abuse, in remission: Secondary | ICD-10-CM | POA: Diagnosis not present

## 2017-09-01 DIAGNOSIS — M1990 Unspecified osteoarthritis, unspecified site: Secondary | ICD-10-CM | POA: Diagnosis not present

## 2017-09-01 DIAGNOSIS — I10 Essential (primary) hypertension: Secondary | ICD-10-CM | POA: Diagnosis not present

## 2017-09-01 DIAGNOSIS — Z1389 Encounter for screening for other disorder: Secondary | ICD-10-CM | POA: Diagnosis not present

## 2017-09-02 DIAGNOSIS — M1711 Unilateral primary osteoarthritis, right knee: Secondary | ICD-10-CM | POA: Diagnosis not present

## 2017-09-02 DIAGNOSIS — F419 Anxiety disorder, unspecified: Secondary | ICD-10-CM | POA: Diagnosis not present

## 2017-09-02 DIAGNOSIS — I10 Essential (primary) hypertension: Secondary | ICD-10-CM | POA: Diagnosis not present

## 2017-09-02 DIAGNOSIS — Z471 Aftercare following joint replacement surgery: Secondary | ICD-10-CM | POA: Diagnosis not present

## 2017-09-02 DIAGNOSIS — F1011 Alcohol abuse, in remission: Secondary | ICD-10-CM | POA: Diagnosis not present

## 2017-09-03 DIAGNOSIS — Z471 Aftercare following joint replacement surgery: Secondary | ICD-10-CM | POA: Diagnosis not present

## 2017-09-03 DIAGNOSIS — M1711 Unilateral primary osteoarthritis, right knee: Secondary | ICD-10-CM | POA: Diagnosis not present

## 2017-09-03 DIAGNOSIS — R531 Weakness: Secondary | ICD-10-CM | POA: Diagnosis not present

## 2017-09-03 DIAGNOSIS — R262 Difficulty in walking, not elsewhere classified: Secondary | ICD-10-CM | POA: Diagnosis not present

## 2017-09-06 DIAGNOSIS — R531 Weakness: Secondary | ICD-10-CM | POA: Diagnosis not present

## 2017-09-06 DIAGNOSIS — R262 Difficulty in walking, not elsewhere classified: Secondary | ICD-10-CM | POA: Diagnosis not present

## 2017-09-06 DIAGNOSIS — Z471 Aftercare following joint replacement surgery: Secondary | ICD-10-CM | POA: Diagnosis not present

## 2017-09-06 DIAGNOSIS — M1711 Unilateral primary osteoarthritis, right knee: Secondary | ICD-10-CM | POA: Diagnosis not present

## 2017-09-08 DIAGNOSIS — R531 Weakness: Secondary | ICD-10-CM | POA: Diagnosis not present

## 2017-09-08 DIAGNOSIS — M1711 Unilateral primary osteoarthritis, right knee: Secondary | ICD-10-CM | POA: Diagnosis not present

## 2017-09-08 DIAGNOSIS — Z471 Aftercare following joint replacement surgery: Secondary | ICD-10-CM | POA: Diagnosis not present

## 2017-09-08 DIAGNOSIS — R262 Difficulty in walking, not elsewhere classified: Secondary | ICD-10-CM | POA: Diagnosis not present

## 2017-09-10 DIAGNOSIS — R262 Difficulty in walking, not elsewhere classified: Secondary | ICD-10-CM | POA: Diagnosis not present

## 2017-09-10 DIAGNOSIS — R531 Weakness: Secondary | ICD-10-CM | POA: Diagnosis not present

## 2017-09-10 DIAGNOSIS — M1711 Unilateral primary osteoarthritis, right knee: Secondary | ICD-10-CM | POA: Diagnosis not present

## 2017-09-10 DIAGNOSIS — Z471 Aftercare following joint replacement surgery: Secondary | ICD-10-CM | POA: Diagnosis not present

## 2017-09-13 DIAGNOSIS — M1711 Unilateral primary osteoarthritis, right knee: Secondary | ICD-10-CM | POA: Diagnosis not present

## 2017-09-13 DIAGNOSIS — R262 Difficulty in walking, not elsewhere classified: Secondary | ICD-10-CM | POA: Diagnosis not present

## 2017-09-13 DIAGNOSIS — R531 Weakness: Secondary | ICD-10-CM | POA: Diagnosis not present

## 2017-09-13 DIAGNOSIS — Z471 Aftercare following joint replacement surgery: Secondary | ICD-10-CM | POA: Diagnosis not present

## 2017-09-15 DIAGNOSIS — Z471 Aftercare following joint replacement surgery: Secondary | ICD-10-CM | POA: Diagnosis not present

## 2017-09-15 DIAGNOSIS — M1711 Unilateral primary osteoarthritis, right knee: Secondary | ICD-10-CM | POA: Diagnosis not present

## 2017-09-15 DIAGNOSIS — R262 Difficulty in walking, not elsewhere classified: Secondary | ICD-10-CM | POA: Diagnosis not present

## 2017-09-15 DIAGNOSIS — R531 Weakness: Secondary | ICD-10-CM | POA: Diagnosis not present

## 2017-09-16 DIAGNOSIS — M1711 Unilateral primary osteoarthritis, right knee: Secondary | ICD-10-CM | POA: Diagnosis not present

## 2017-09-22 DIAGNOSIS — Z471 Aftercare following joint replacement surgery: Secondary | ICD-10-CM | POA: Diagnosis not present

## 2017-09-22 DIAGNOSIS — R531 Weakness: Secondary | ICD-10-CM | POA: Diagnosis not present

## 2017-09-22 DIAGNOSIS — R262 Difficulty in walking, not elsewhere classified: Secondary | ICD-10-CM | POA: Diagnosis not present

## 2017-09-22 DIAGNOSIS — M1711 Unilateral primary osteoarthritis, right knee: Secondary | ICD-10-CM | POA: Diagnosis not present

## 2017-09-27 DIAGNOSIS — I10 Essential (primary) hypertension: Secondary | ICD-10-CM | POA: Diagnosis not present

## 2017-09-27 DIAGNOSIS — Z96651 Presence of right artificial knee joint: Secondary | ICD-10-CM | POA: Diagnosis not present

## 2017-09-27 DIAGNOSIS — R5381 Other malaise: Secondary | ICD-10-CM | POA: Diagnosis not present

## 2017-09-27 DIAGNOSIS — Z6833 Body mass index (BMI) 33.0-33.9, adult: Secondary | ICD-10-CM | POA: Diagnosis not present

## 2017-09-28 DIAGNOSIS — F411 Generalized anxiety disorder: Secondary | ICD-10-CM | POA: Diagnosis not present

## 2017-09-29 DIAGNOSIS — R262 Difficulty in walking, not elsewhere classified: Secondary | ICD-10-CM | POA: Diagnosis not present

## 2017-09-29 DIAGNOSIS — M1711 Unilateral primary osteoarthritis, right knee: Secondary | ICD-10-CM | POA: Diagnosis not present

## 2017-09-29 DIAGNOSIS — R531 Weakness: Secondary | ICD-10-CM | POA: Diagnosis not present

## 2017-09-29 DIAGNOSIS — Z471 Aftercare following joint replacement surgery: Secondary | ICD-10-CM | POA: Diagnosis not present

## 2017-10-04 DIAGNOSIS — M1711 Unilateral primary osteoarthritis, right knee: Secondary | ICD-10-CM | POA: Diagnosis not present

## 2017-10-04 DIAGNOSIS — Z471 Aftercare following joint replacement surgery: Secondary | ICD-10-CM | POA: Diagnosis not present

## 2017-10-04 DIAGNOSIS — R262 Difficulty in walking, not elsewhere classified: Secondary | ICD-10-CM | POA: Diagnosis not present

## 2017-10-14 DIAGNOSIS — R7301 Impaired fasting glucose: Secondary | ICD-10-CM | POA: Diagnosis not present

## 2017-10-14 DIAGNOSIS — Z1389 Encounter for screening for other disorder: Secondary | ICD-10-CM | POA: Diagnosis not present

## 2017-10-14 DIAGNOSIS — I10 Essential (primary) hypertension: Secondary | ICD-10-CM | POA: Diagnosis not present

## 2017-10-14 DIAGNOSIS — R946 Abnormal results of thyroid function studies: Secondary | ICD-10-CM | POA: Diagnosis not present

## 2017-10-14 DIAGNOSIS — M1711 Unilateral primary osteoarthritis, right knee: Secondary | ICD-10-CM | POA: Diagnosis not present

## 2017-10-14 DIAGNOSIS — E7849 Other hyperlipidemia: Secondary | ICD-10-CM | POA: Diagnosis not present

## 2017-10-27 DIAGNOSIS — M545 Low back pain: Secondary | ICD-10-CM | POA: Diagnosis not present

## 2017-10-27 DIAGNOSIS — M9903 Segmental and somatic dysfunction of lumbar region: Secondary | ICD-10-CM | POA: Diagnosis not present

## 2017-10-27 DIAGNOSIS — M62830 Muscle spasm of back: Secondary | ICD-10-CM | POA: Diagnosis not present

## 2017-10-27 DIAGNOSIS — R293 Abnormal posture: Secondary | ICD-10-CM | POA: Diagnosis not present

## 2017-10-28 ENCOUNTER — Other Ambulatory Visit: Payer: Self-pay | Admitting: Orthopedic Surgery

## 2017-10-28 DIAGNOSIS — M1711 Unilateral primary osteoarthritis, right knee: Secondary | ICD-10-CM | POA: Diagnosis not present

## 2017-10-28 DIAGNOSIS — M545 Low back pain: Secondary | ICD-10-CM

## 2017-10-29 DIAGNOSIS — M62830 Muscle spasm of back: Secondary | ICD-10-CM | POA: Diagnosis not present

## 2017-10-29 DIAGNOSIS — M545 Low back pain: Secondary | ICD-10-CM | POA: Diagnosis not present

## 2017-10-29 DIAGNOSIS — R293 Abnormal posture: Secondary | ICD-10-CM | POA: Diagnosis not present

## 2017-10-29 DIAGNOSIS — M9903 Segmental and somatic dysfunction of lumbar region: Secondary | ICD-10-CM | POA: Diagnosis not present

## 2017-11-01 DIAGNOSIS — M62830 Muscle spasm of back: Secondary | ICD-10-CM | POA: Diagnosis not present

## 2017-11-01 DIAGNOSIS — R293 Abnormal posture: Secondary | ICD-10-CM | POA: Diagnosis not present

## 2017-11-01 DIAGNOSIS — M545 Low back pain: Secondary | ICD-10-CM | POA: Diagnosis not present

## 2017-11-01 DIAGNOSIS — M9903 Segmental and somatic dysfunction of lumbar region: Secondary | ICD-10-CM | POA: Diagnosis not present

## 2017-11-03 ENCOUNTER — Ambulatory Visit
Admission: RE | Admit: 2017-11-03 | Discharge: 2017-11-03 | Disposition: A | Discharge status: Home / Self Care | Payer: BLUE CROSS/BLUE SHIELD | Source: Ambulatory Visit | Attending: Orthopedic Surgery | Admitting: Orthopedic Surgery

## 2017-11-03 DIAGNOSIS — M9903 Segmental and somatic dysfunction of lumbar region: Secondary | ICD-10-CM | POA: Diagnosis not present

## 2017-11-03 DIAGNOSIS — M545 Low back pain: Secondary | ICD-10-CM | POA: Diagnosis not present

## 2017-11-03 DIAGNOSIS — M62830 Muscle spasm of back: Secondary | ICD-10-CM | POA: Diagnosis not present

## 2017-11-03 DIAGNOSIS — R293 Abnormal posture: Secondary | ICD-10-CM | POA: Diagnosis not present

## 2017-11-03 DIAGNOSIS — M48061 Spinal stenosis, lumbar region without neurogenic claudication: Secondary | ICD-10-CM | POA: Diagnosis not present

## 2017-11-05 DIAGNOSIS — M545 Low back pain: Secondary | ICD-10-CM | POA: Diagnosis not present

## 2017-11-05 DIAGNOSIS — R293 Abnormal posture: Secondary | ICD-10-CM | POA: Diagnosis not present

## 2017-11-05 DIAGNOSIS — M9903 Segmental and somatic dysfunction of lumbar region: Secondary | ICD-10-CM | POA: Diagnosis not present

## 2017-11-05 DIAGNOSIS — M62830 Muscle spasm of back: Secondary | ICD-10-CM | POA: Diagnosis not present

## 2017-11-08 DIAGNOSIS — M1711 Unilateral primary osteoarthritis, right knee: Secondary | ICD-10-CM | POA: Diagnosis not present

## 2017-11-12 DIAGNOSIS — R293 Abnormal posture: Secondary | ICD-10-CM | POA: Diagnosis not present

## 2017-11-12 DIAGNOSIS — M62830 Muscle spasm of back: Secondary | ICD-10-CM | POA: Diagnosis not present

## 2017-11-12 DIAGNOSIS — M9903 Segmental and somatic dysfunction of lumbar region: Secondary | ICD-10-CM | POA: Diagnosis not present

## 2017-11-12 DIAGNOSIS — M545 Low back pain: Secondary | ICD-10-CM | POA: Diagnosis not present

## 2017-11-15 DIAGNOSIS — M9903 Segmental and somatic dysfunction of lumbar region: Secondary | ICD-10-CM | POA: Diagnosis not present

## 2017-11-15 DIAGNOSIS — M62830 Muscle spasm of back: Secondary | ICD-10-CM | POA: Diagnosis not present

## 2017-11-15 DIAGNOSIS — R293 Abnormal posture: Secondary | ICD-10-CM | POA: Diagnosis not present

## 2017-11-15 DIAGNOSIS — M545 Low back pain: Secondary | ICD-10-CM | POA: Diagnosis not present

## 2017-11-17 DIAGNOSIS — M9903 Segmental and somatic dysfunction of lumbar region: Secondary | ICD-10-CM | POA: Diagnosis not present

## 2017-11-17 DIAGNOSIS — M545 Low back pain: Secondary | ICD-10-CM | POA: Diagnosis not present

## 2017-11-17 DIAGNOSIS — M62830 Muscle spasm of back: Secondary | ICD-10-CM | POA: Diagnosis not present

## 2017-11-17 DIAGNOSIS — R293 Abnormal posture: Secondary | ICD-10-CM | POA: Diagnosis not present

## 2017-11-19 DIAGNOSIS — M9903 Segmental and somatic dysfunction of lumbar region: Secondary | ICD-10-CM | POA: Diagnosis not present

## 2017-11-19 DIAGNOSIS — M62830 Muscle spasm of back: Secondary | ICD-10-CM | POA: Diagnosis not present

## 2017-11-19 DIAGNOSIS — R293 Abnormal posture: Secondary | ICD-10-CM | POA: Diagnosis not present

## 2017-11-19 DIAGNOSIS — M545 Low back pain: Secondary | ICD-10-CM | POA: Diagnosis not present

## 2017-11-22 DIAGNOSIS — M9903 Segmental and somatic dysfunction of lumbar region: Secondary | ICD-10-CM | POA: Diagnosis not present

## 2017-11-22 DIAGNOSIS — R293 Abnormal posture: Secondary | ICD-10-CM | POA: Diagnosis not present

## 2017-11-22 DIAGNOSIS — M62830 Muscle spasm of back: Secondary | ICD-10-CM | POA: Diagnosis not present

## 2017-11-22 DIAGNOSIS — M545 Low back pain: Secondary | ICD-10-CM | POA: Diagnosis not present

## 2017-11-24 DIAGNOSIS — M25561 Pain in right knee: Secondary | ICD-10-CM | POA: Diagnosis not present

## 2017-11-24 DIAGNOSIS — M5136 Other intervertebral disc degeneration, lumbar region: Secondary | ICD-10-CM | POA: Diagnosis not present

## 2017-11-24 DIAGNOSIS — M5416 Radiculopathy, lumbar region: Secondary | ICD-10-CM | POA: Diagnosis not present

## 2017-11-26 DIAGNOSIS — M9903 Segmental and somatic dysfunction of lumbar region: Secondary | ICD-10-CM | POA: Diagnosis not present

## 2017-11-26 DIAGNOSIS — R293 Abnormal posture: Secondary | ICD-10-CM | POA: Diagnosis not present

## 2017-11-26 DIAGNOSIS — M545 Low back pain: Secondary | ICD-10-CM | POA: Diagnosis not present

## 2017-11-26 DIAGNOSIS — M62830 Muscle spasm of back: Secondary | ICD-10-CM | POA: Diagnosis not present

## 2017-11-29 DIAGNOSIS — M62830 Muscle spasm of back: Secondary | ICD-10-CM | POA: Diagnosis not present

## 2017-11-29 DIAGNOSIS — M9903 Segmental and somatic dysfunction of lumbar region: Secondary | ICD-10-CM | POA: Diagnosis not present

## 2017-11-29 DIAGNOSIS — R293 Abnormal posture: Secondary | ICD-10-CM | POA: Diagnosis not present

## 2017-11-29 DIAGNOSIS — M545 Low back pain: Secondary | ICD-10-CM | POA: Diagnosis not present

## 2017-12-01 DIAGNOSIS — M9903 Segmental and somatic dysfunction of lumbar region: Secondary | ICD-10-CM | POA: Diagnosis not present

## 2017-12-01 DIAGNOSIS — M545 Low back pain: Secondary | ICD-10-CM | POA: Diagnosis not present

## 2017-12-01 DIAGNOSIS — M62830 Muscle spasm of back: Secondary | ICD-10-CM | POA: Diagnosis not present

## 2017-12-01 DIAGNOSIS — R293 Abnormal posture: Secondary | ICD-10-CM | POA: Diagnosis not present

## 2017-12-15 HISTORY — PX: TOTAL KNEE ARTHROPLASTY: SHX125

## 2018-01-07 DIAGNOSIS — M5136 Other intervertebral disc degeneration, lumbar region: Secondary | ICD-10-CM | POA: Diagnosis not present

## 2018-01-07 DIAGNOSIS — M5416 Radiculopathy, lumbar region: Secondary | ICD-10-CM | POA: Diagnosis not present

## 2018-02-07 DIAGNOSIS — M25561 Pain in right knee: Secondary | ICD-10-CM | POA: Diagnosis not present

## 2018-02-15 DIAGNOSIS — M25561 Pain in right knee: Secondary | ICD-10-CM | POA: Diagnosis not present

## 2018-02-15 DIAGNOSIS — Z96651 Presence of right artificial knee joint: Secondary | ICD-10-CM | POA: Diagnosis not present

## 2018-03-08 DIAGNOSIS — M25561 Pain in right knee: Secondary | ICD-10-CM | POA: Diagnosis not present

## 2018-03-09 DIAGNOSIS — F411 Generalized anxiety disorder: Secondary | ICD-10-CM | POA: Diagnosis not present

## 2018-03-11 DIAGNOSIS — T8482XA Fibrosis due to internal orthopedic prosthetic devices, implants and grafts, initial encounter: Secondary | ICD-10-CM | POA: Diagnosis not present

## 2018-03-11 DIAGNOSIS — Z96651 Presence of right artificial knee joint: Secondary | ICD-10-CM | POA: Diagnosis not present

## 2018-03-11 DIAGNOSIS — M238X1 Other internal derangements of right knee: Secondary | ICD-10-CM | POA: Diagnosis not present

## 2018-03-23 DIAGNOSIS — T8482XA Fibrosis due to internal orthopedic prosthetic devices, implants and grafts, initial encounter: Secondary | ICD-10-CM | POA: Diagnosis not present

## 2018-04-13 DIAGNOSIS — R82998 Other abnormal findings in urine: Secondary | ICD-10-CM | POA: Diagnosis not present

## 2018-04-13 DIAGNOSIS — Z125 Encounter for screening for malignant neoplasm of prostate: Secondary | ICD-10-CM | POA: Diagnosis not present

## 2018-04-13 DIAGNOSIS — M1712 Unilateral primary osteoarthritis, left knee: Secondary | ICD-10-CM | POA: Diagnosis not present

## 2018-04-13 DIAGNOSIS — R7301 Impaired fasting glucose: Secondary | ICD-10-CM | POA: Diagnosis not present

## 2018-04-13 DIAGNOSIS — R946 Abnormal results of thyroid function studies: Secondary | ICD-10-CM | POA: Diagnosis not present

## 2018-04-13 DIAGNOSIS — Z0000 Encounter for general adult medical examination without abnormal findings: Secondary | ICD-10-CM | POA: Diagnosis not present

## 2018-04-13 DIAGNOSIS — Z96651 Presence of right artificial knee joint: Secondary | ICD-10-CM | POA: Diagnosis not present

## 2018-04-13 DIAGNOSIS — M1711 Unilateral primary osteoarthritis, right knee: Secondary | ICD-10-CM | POA: Diagnosis not present

## 2018-04-13 DIAGNOSIS — I10 Essential (primary) hypertension: Secondary | ICD-10-CM | POA: Diagnosis not present

## 2018-04-13 DIAGNOSIS — Z471 Aftercare following joint replacement surgery: Secondary | ICD-10-CM | POA: Diagnosis not present

## 2018-04-22 DIAGNOSIS — M1990 Unspecified osteoarthritis, unspecified site: Secondary | ICD-10-CM | POA: Diagnosis not present

## 2018-04-22 DIAGNOSIS — Z0000 Encounter for general adult medical examination without abnormal findings: Secondary | ICD-10-CM | POA: Diagnosis not present

## 2018-04-22 DIAGNOSIS — E7849 Other hyperlipidemia: Secondary | ICD-10-CM | POA: Diagnosis not present

## 2018-04-22 DIAGNOSIS — Z23 Encounter for immunization: Secondary | ICD-10-CM | POA: Diagnosis not present

## 2018-04-22 DIAGNOSIS — R7302 Impaired glucose tolerance (oral): Secondary | ICD-10-CM | POA: Diagnosis not present

## 2018-04-22 DIAGNOSIS — Z1389 Encounter for screening for other disorder: Secondary | ICD-10-CM | POA: Diagnosis not present

## 2018-04-22 DIAGNOSIS — I10 Essential (primary) hypertension: Secondary | ICD-10-CM | POA: Diagnosis not present

## 2018-04-26 DIAGNOSIS — Z1212 Encounter for screening for malignant neoplasm of rectum: Secondary | ICD-10-CM | POA: Diagnosis not present

## 2018-05-26 DIAGNOSIS — F419 Anxiety disorder, unspecified: Secondary | ICD-10-CM | POA: Diagnosis not present

## 2018-06-07 DIAGNOSIS — F419 Anxiety disorder, unspecified: Secondary | ICD-10-CM | POA: Diagnosis not present

## 2018-06-23 DIAGNOSIS — F419 Anxiety disorder, unspecified: Secondary | ICD-10-CM | POA: Diagnosis not present

## 2018-07-18 ENCOUNTER — Other Ambulatory Visit: Payer: Self-pay | Admitting: Orthopedic Surgery

## 2018-07-22 ENCOUNTER — Encounter (HOSPITAL_COMMUNITY): Payer: Self-pay

## 2018-07-25 NOTE — Patient Instructions (Signed)
Your procedure is scheduled on: Monday, Dec. 16, 2019   Surgery Time:  7:15AM-09:10 AM   [facility]  En[facility]    Report to admitting at 5:30 AM   Call this number if you have problems the morning of surgery [phone]   Do not eat food or drink liquids :After Midnight.   Brush your teeth the morning of surgery.   Do NOT smoke after Midnight   Take these medicines the morning of surgery with A SIP OF WATER: Omeprazole, Propanolol, Sertraline, Simvastatin                               You may not have any metal on your body including jewelry, and body piercings             Do not wear lotions, powders, perfumes/cologne, or deodorant                         Men may shave face and neck.   Do not bring valuables to the hospital. [place] IS NOT             RESPONSIBLE   FOR VALUABLES.   Contacts, dentures or bridgework may not be worn into surgery.   Leave suitcase in the car. After surgery it may be brought to your room.    Special Instructions: Bring a copy of your healthcare power of attorney and living will documents         the day of surgery if you haven't scanned them in before.              Please read over the following fact sheets you were given:  [facility] Pre[facility] Before surgery, you can play an important role.  Because skin is not sterile, your skin needs to be as free of germs as possible.  You can reduce the number of germs on your skin by washing with CHG (chlorahexidine gluconate) soap before surgery.  CHG is an antiseptic cleaner which kills germs and bonds with the skin to continue killing germs even after washing. Please DO NOT use if you have an allergy to CHG or antibacterial soaps.  If your skin becomes reddened/irritated stop using the CHG and inform your nurse when you arrive at Short Stay. Do not shave (including legs and underarms) for at least 48 hours prior to the first CHG shower.  You may shave your  face/neck.  Please follow these instructions carefully:  1.  Shower with CHG Soap the night before surgery and the  morning of surgery.  2.  If you choose to wash your hair, wash your hair first as usual with your normal  shampoo.  3.  After you shampoo, rinse your hair and body thoroughly to remove the shampoo.                             4.  Use CHG as you would any other liquid soap.  You can apply chg directly to the skin and wash.  Gently with a scrungie or clean washcloth.  5.  Apply the CHG Soap to your body ONLY FROM THE NECK DOWN.   Do   not use on face/ open  Wound or open sores. Avoid contact with eyes, ears mouth and   genitals (private parts).                       Wash face,  Genitals (private parts) with your normal soap.             6.  Wash thoroughly, paying special attention to the area where your    surgery  will be performed.  7.  Thoroughly rinse your body with warm water from the neck down.  8.  DO NOT shower/wash with your normal soap after using and rinsing off the CHG Soap.                9.  Pat yourself dry with a clean towel.            10.  Wear clean pajamas.            11.  Place clean sheets on your bed the night of your first shower and do not  sleep with pets. Day of Surgery : Do not apply any lotions/deodorants the morning of surgery.  Please wear clean clothes to the hospital/surgery center.  FAILURE TO FOLLOW THESE INSTRUCTIONS MAY RESULT IN THE CANCELLATION OF YOUR SURGERY  PATIENT SIGNATURE_________________________________  NURSE SIGNATURE__________________________________  ________________________________________________________________________    WHAT IS A BLOOD TRANSFUSION? Blood Transfusion Information  A transfusion is the replacement of blood or some of its parts. Blood is made up of multiple cells which provide different functions.  Red blood cells carry oxygen and are used for blood loss replacement.  White  blood cells fight against infection.  Platelets control bleeding.  Plasma helps clot blood.  Other blood products are available for specialized needs, such as hemophilia or other clotting disorders. BEFORE THE TRANSFUSION  Who gives blood for transfusions?   Healthy volunteers who are fully evaluated to make sure their blood is safe. This is blood bank blood. Transfusion therapy is the safest it has ever been in the practice of medicine. Before blood is taken from a donor, a complete history is taken to make sure that person has no history of diseases nor engages in risky social behavior (examples are intravenous drug use or sexual activity with multiple partners). The donor's travel history is screened to minimize risk of transmitting infections, such as malaria. The donated blood is tested for signs of infectious diseases, such as HIV and hepatitis. The blood is then tested to be sure it is compatible with you in order to minimize the chance of a transfusion reaction. If you or a relative donates blood, this is often done in anticipation of surgery and is not appropriate for emergency situations. It takes many days to process the donated blood. RISKS AND COMPLICATIONS Although transfusion therapy is very safe and saves many lives, the main dangers of transfusion include:   Getting an infectious disease.  Developing a transfusion reaction. This is an allergic reaction to something in the blood you were given. Every precaution is taken to prevent this. The decision to have a blood transfusion has been considered carefully by your caregiver before blood is given. Blood is not given unless the benefits outweigh the risks. AFTER THE TRANSFUSION  Right after receiving a blood transfusion, you will usually feel much better and more energetic. This is especially true if your red blood cells have gotten low (anemic). The transfusion raises the level of the red blood cells which carry oxygen, and this  usually causes an energy increase.  The nurse administering the transfusion will monitor you carefully for complications. HOME CARE INSTRUCTIONS  No special instructions are needed after a transfusion. You may find your energy is better. Speak with your caregiver about any limitations on activity for underlying diseases you may have. SEEK MEDICAL CARE IF:   Your condition is not improving after your transfusion.  You develop redness or irritation at the intravenous (IV) site. SEEK IMMEDIATE MEDICAL CARE IF:  Any of the following symptoms occur over the next 12 hours:  Shaking chills.  You have a temperature by mouth above 102 F (38.9 C), not controlled by medicine.  Chest, back, or muscle pain.  People around you feel you are not acting correctly or are confused.  Shortness of breath or difficulty breathing.  Dizziness and fainting.  You get a rash or develop hives.  You have a decrease in urine output.  Your urine turns a dark color or changes to pink, red, or brown. Any of the following symptoms occur over the next 10 days:  You have a temperature by mouth above 102 F (38.9 C), not controlled by medicine.  Shortness of breath.  Weakness after normal activity.  The white part of the eye turns yellow (jaundice).  You have a decrease in the amount of urine or are urinating less often.  Your urine turns a dark color or changes to pink, red, or brown. Document Released: 07/31/2000 Document Revised: 10/26/2011 Document Reviewed: 03/19/2008 [facility] Patient Information 2014 [place], [place].  _______________________________________________________________________

## 2018-07-26 ENCOUNTER — Ambulatory Visit (HOSPITAL_COMMUNITY)
Admission: RE | Admit: 2018-07-26 | Discharge: 2018-07-26 | Disposition: A | Discharge status: Home / Self Care | Payer: BLUE CROSS/BLUE SHIELD | Source: Ambulatory Visit | Attending: Orthopedic Surgery | Admitting: Orthopedic Surgery

## 2018-07-26 ENCOUNTER — Other Ambulatory Visit: Payer: Self-pay

## 2018-07-26 ENCOUNTER — Encounter (HOSPITAL_COMMUNITY): Payer: Self-pay

## 2018-07-26 ENCOUNTER — Encounter (HOSPITAL_COMMUNITY)
Admission: RE | Admit: 2018-07-26 | Discharge: 2018-07-26 | Disposition: A | Discharge status: Home / Self Care | Payer: BLUE CROSS/BLUE SHIELD | Source: Ambulatory Visit | Attending: Orthopedic Surgery | Admitting: Orthopedic Surgery

## 2018-07-26 DIAGNOSIS — J984 Other disorders of lung: Secondary | ICD-10-CM | POA: Diagnosis not present

## 2018-07-26 DIAGNOSIS — Z01818 Encounter for other preprocedural examination: Secondary | ICD-10-CM

## 2018-07-26 DIAGNOSIS — M1712 Unilateral primary osteoarthritis, left knee: Secondary | ICD-10-CM | POA: Diagnosis not present

## 2018-07-26 HISTORY — DX: Other hemorrhoids: K64.8

## 2018-07-26 HISTORY — DX: Unspecified rotator cuff tear or rupture of left shoulder, not specified as traumatic: M75.102

## 2018-07-26 HISTORY — DX: Diverticulosis of intestine, part unspecified, without perforation or abscess without bleeding: K57.90

## 2018-07-26 LAB — CBC WITH DIFFERENTIAL/PLATELET
Abs Immature Granulocytes: 0.09 10*3/uL — ABNORMAL HIGH (ref 0.00–0.07)
Basophils Absolute: 0 10*3/uL (ref 0.0–0.1)
Basophils Relative: 1 %
Eosinophils Absolute: 0.2 10*3/uL (ref 0.0–0.5)
Eosinophils Relative: 2 %
HCT: 44.6 % (ref 39.0–52.0)
Hemoglobin: 15 g/dL (ref 13.0–17.0)
Immature Granulocytes: 1 %
Lymphocytes Relative: 21 %
Lymphs Abs: 1.4 10*3/uL (ref 0.7–4.0)
MCH: 30.9 pg (ref 26.0–34.0)
MCHC: 33.6 g/dL (ref 30.0–36.0)
MCV: 92 fL (ref 80.0–100.0)
Monocytes Absolute: 0.6 10*3/uL (ref 0.1–1.0)
Monocytes Relative: 8 %
Neutro Abs: 4.6 10*3/uL (ref 1.7–7.7)
Neutrophils Relative %: 67 %
Platelets: 226 10*3/uL (ref 150–400)
RBC: 4.85 MIL/uL (ref 4.22–5.81)
RDW: 12.7 % (ref 11.5–15.5)
WBC: 6.9 10*3/uL (ref 4.0–10.5)
nRBC: 0 % (ref 0.0–0.2)

## 2018-07-26 LAB — URINALYSIS, ROUTINE W REFLEX MICROSCOPIC
Bilirubin Urine: NEGATIVE
Glucose, UA: NEGATIVE mg/dL
Hgb urine dipstick: NEGATIVE
Ketones, ur: NEGATIVE mg/dL
Leukocytes, UA: NEGATIVE
Nitrite: NEGATIVE
Protein, ur: NEGATIVE mg/dL
Specific Gravity, Urine: 1.018 (ref 1.005–1.030)
pH: 5 (ref 5.0–8.0)

## 2018-07-26 LAB — BASIC METABOLIC PANEL
Anion gap: 10 (ref 5–15)
BUN: 40 mg/dL — ABNORMAL HIGH (ref 6–20)
CO2: 24 mmol/L (ref 22–32)
Calcium: 9.5 mg/dL (ref 8.9–10.3)
Chloride: 99 mmol/L (ref 98–111)
Creatinine, Ser: 1.2 mg/dL (ref 0.61–1.24)
GFR calc Af Amer: >60 mL/min (ref >60)
GFR calc non Af Amer: >60 mL/min (ref >60)
Glucose, Bld: 101 mg/dL — ABNORMAL HIGH (ref 70–99)
Potassium: 4.4 mmol/L (ref 3.5–5.1)
Sodium: 133 mmol/L — ABNORMAL LOW (ref 135–145)

## 2018-07-26 LAB — SURGICAL PCR SCREEN
MRSA, PCR: NEGATIVE
Staphylococcus aureus: POSITIVE — AB

## 2018-07-26 LAB — PROTIME-INR
INR: 0.97
Prothrombin Time: 12.8 seconds (ref 11.4–15.2)

## 2018-07-26 LAB — APTT: aPTT: 30 seconds (ref 24–36)

## 2018-07-26 LAB — ABO/RH: ABO/RH(D): O POS

## 2018-07-26 NOTE — Pre-Procedure Instructions (Signed)
Adam Harvey returned called he has picked up his prescription.

## 2018-07-26 NOTE — Pre-Procedure Instructions (Signed)
BMP, CXR, PCR results 07/26/2018 sent to Dr. Turner Danielsowan via epic.  Mupirocin called in to [facility].  Left a voicemail with Noe to inform him to pick up and begin Mupirocin treatment.

## 2018-07-29 DIAGNOSIS — M1712 Unilateral primary osteoarthritis, left knee: Secondary | ICD-10-CM | POA: Diagnosis present

## 2018-07-29 NOTE — H&P (Addendum)
TOTAL KNEE ADMISSION H&P  Patient is being admitted for left total knee arthroplasty.  Subjective:  Chief Complaint:left knee pain.  HPI: Adam Harvey, 57 y.o. male, has a history of pain and functional disability in the left knee due to arthritis and has failed non-surgical conservative treatments for greater than 12 weeks to includeNSAID's and/or analgesics, corticosteriod injections and activity modification.  Onset of symptoms was gradual, starting several years ago with gradually worsening course since that time. The patient noted no past surgery on the left knee(s).  Patient currently rates pain in the left knee(s) at 10 out of 10 with activity. Patient has night pain, worsening of pain with activity and weight bearing, pain that interferes with activities of daily living, pain with passive range of motion, crepitus and joint swelling.  Patient has evidence of joint subluxation and joint space narrowing by imaging studies.  There is no active infection.  Patient Active Problem List   Diagnosis Date Noted  . Primary localized osteoarthritis of right knee 08/20/2017  . Tear of right rotator cuff 08/03/2014   Past Medical History:  Diagnosis Date  . Alcoholism (HCC)    sober since 01-2011   . Anxiety   . Arthritis   . Complication of anesthesia    unsuccessful spinal with right knee replacement, used general anesthesia  . Depression   . Diverticulosis 2012  . GERD (gastroesophageal reflux disease)   . Hemorrhoids   . Hyperlipidemia   . Hypertension   . Internal hemorrhoids 2012  . Obsessive compulsive disorder   . Osteoarthritis   . Rotator cuff tear, left   . Tear of right rotator cuff 08/03/2014  . Wears contact lenses     Past Surgical History:  Procedure Laterality Date  . ABCESS DRAINAGE  2012   nasal cyst  . ACHILLES TENDON REPAIR  2008   right  . ARTHOSCOPIC ROTAOR CUFF REPAIR Right 08/03/2014   Procedure: ARTHROSCOPIC ROTATOR CUFF REPAIR;  Surgeon: Eulas Post, MD;  Location: [facility];  Service: Orthopedics;  Laterality: Right;  . COLONOSCOPY  2012  . INGUINAL HERNIA REPAIR     right as infant  . KNEE ARTHROSCOPY Left    x4  . KNEE SURGERY     x3-right  . ROTATOR CUFF REPAIR  2011   left  . TONSILLECTOMY    . TOTAL KNEE ARTHROPLASTY Right 12/2017   debride scar tissue  . TOTAL KNEE ARTHROPLASTY Right 08/20/2017   Procedure: TOTAL KNEE ARTHROPLASTY;  Surgeon: Frederico Hamman, MD;  Location: [facility] OR;  Service: Orthopedics;  Laterality: Right;  . TYMPANOSTOMY TUBE PLACEMENT     as child, several times    No current facility-administered medications for this encounter.    Current Outpatient Medications  Medication Sig Dispense Refill Last Dose  . aspirin EC 81 MG tablet Take 81 mg by mouth daily.     Marland Kitchen lamoTRIgine (LAMICTAL) 150 MG tablet Take 150 mg by mouth at bedtime.   08/19/2017 at Unknown time  . lisinopril-hydrochlorothiazide (PRINZIDE,ZESTORETIC) 20-12.5 MG per tablet Take 1 tablet by mouth 2 (two) times daily.    08/19/2017 at Unknown time  . meloxicam (MOBIC) 15 MG tablet Take 15 mg by mouth daily.     . Multiple Vitamins-Minerals (MULTIVITAMIN WITH MINERALS) tablet Take 1 tablet by mouth daily.   Past Week at Unknown time  . omeprazole (PRILOSEC) 20 MG capsule Take 20 mg by mouth 2 (two) times daily.     . propranolol (INDERAL)  40 MG tablet Take 40 mg by mouth 2 (two) times daily.     . sertraline (ZOLOFT) 100 MG tablet Take 200 mg by mouth daily.   08/20/2017 at 0430  . simvastatin (ZOCOR) 40 MG tablet Take 40 mg by mouth daily.    08/20/2017 at 0430   No Known Allergies  Social History   Tobacco Use  . Smoking status: Former Smoker    Types: E-cigarettes    Last attempt to quit: 08/01/2010    Years since quitting: 7.9  . Smokeless tobacco: Never Used  Substance Use Topics  . Alcohol use: No    Comment: not since 2012    Family History  Problem Relation Age of Onset  . Colon cancer Neg Hx      Review of  Systems  Constitutional: Negative.   HENT: Negative.   Eyes: Negative.   Respiratory: Negative.   Cardiovascular:       Htn  Gastrointestinal: Negative.   Genitourinary: Negative.   Musculoskeletal: Positive for joint pain.  Skin: Negative.   Neurological: Negative.   Endo/Heme/Allergies: Negative.   Psychiatric/Behavioral: Positive for depression. The patient is nervous/anxious.     Objective:  Physical Exam  Constitutional: He is oriented to person, place, and time. He appears well-developed and well-nourished.  HENT:  Head: Normocephalic and atraumatic.  Eyes: Pupils are equal, round, and reactive to light.  Neck: Normal range of motion. Neck supple.  Cardiovascular: Intact distal pulses.  Respiratory: Effort normal.  Musculoskeletal:        General: Tenderness present.     Comments: The left knee has obvious varus deformity, bone-on-bone lateral subluxation of tibia beneath the femur of about a centimeter.  Collateral ligaments are stable.  Neurovascularly intact.    Neurological: He is alert and oriented to person, place, and time.  Skin: Skin is warm and dry.  Psychiatric: He has a normal mood and affect. His behavior is normal. Judgment and thought content normal.    Vital signs in last 24 hours:    Labs:   Estimated body mass index is 33.91 kg/m as calculated from the following:   Height as of 07/26/18: 6' (1.829 m).   Weight as of 07/26/18: 113.4 kg.   Imaging Review Plain radiographs demonstrate  left knee has obvious varus deformity, bone-on-bone lateral subluxation of tibia beneath the femur of about a centimeter.     Preoperative templating of the joint replacement has been completed, documented, and submitted to the Operating Room personnel in order to optimize intra-operative equipment management.    Patient's anticipated LOS is less than 2 midnights, meeting these requirements: - Younger than [id number] - Lives within 1 hour of care - Has a competent  adult at home to recover with post-op recover - NO history of  - Chronic pain requiring opiods  - Diabetes  - Coronary Artery Disease  - Heart failure  - Heart attack  - Stroke  - DVT/VTE  - Cardiac arrhythmia  - Respiratory Failure/COPD  - Renal failure  - Anemia  - Advanced Liver disease        Assessment/Plan:  End stage arthritis, left knee   The patient history, physical examination, clinical judgment of the provider and imaging studies are consistent with end stage degenerative joint disease of the left knee(s) and total knee arthroplasty is deemed medically necessary. The treatment options including medical management, injection therapy arthroscopy and arthroplasty were discussed at length. The risks and benefits of total knee arthroplasty were presented  and reviewed. The risks due to aseptic loosening, infection, stiffness, patella tracking problems, thromboembolic complications and other imponderables were discussed. The patient acknowledged the explanation, agreed to proceed with the plan and consent was signed. Patient is being admitted for inpatient treatment for surgery, pain control, PT, OT, prophylactic antibiotics, VTE prophylaxis, progressive ambulation and ADL's and discharge planning. The patient is planning to be discharged home with home health services

## 2018-07-31 MED ORDER — BUPIVACAINE LIPOSOME 1.3 % IJ SUSP
20.0000 mL | Freq: Once | INTRAMUSCULAR | Status: AC
  Filled 2018-07-31: qty 20

## 2018-07-31 MED ORDER — TRANEXAMIC ACID 1000 MG/10ML IV SOLN
2000.0000 mg | INTRAVENOUS | Status: AC
  Filled 2018-07-31: qty 20

## 2018-08-01 ENCOUNTER — Encounter (HOSPITAL_COMMUNITY)
Admission: RE | Disposition: A | Discharge status: Home / Self Care | Payer: Self-pay | Source: Home / Self Care | Attending: Orthopedic Surgery

## 2018-08-01 ENCOUNTER — Encounter (HOSPITAL_COMMUNITY): Payer: Self-pay

## 2018-08-01 ENCOUNTER — Ambulatory Visit (HOSPITAL_COMMUNITY): Payer: BLUE CROSS/BLUE SHIELD | Admitting: Registered Nurse

## 2018-08-01 ENCOUNTER — Ambulatory Visit (HOSPITAL_COMMUNITY)
Admission: RE | Admit: 2018-08-01 | Discharge: 2018-08-02 | Disposition: A | Discharge status: Home / Self Care | Payer: BLUE CROSS/BLUE SHIELD | Attending: Orthopedic Surgery | Admitting: Orthopedic Surgery

## 2018-08-01 ENCOUNTER — Other Ambulatory Visit: Payer: Self-pay

## 2018-08-01 DIAGNOSIS — Z7982 Long term (current) use of aspirin: Secondary | ICD-10-CM | POA: Diagnosis not present

## 2018-08-01 DIAGNOSIS — Z791 Long term (current) use of non-steroidal anti-inflammatories (NSAID): Secondary | ICD-10-CM | POA: Insufficient documentation

## 2018-08-01 DIAGNOSIS — E1122 Type 2 diabetes mellitus with diabetic chronic kidney disease: Secondary | ICD-10-CM | POA: Insufficient documentation

## 2018-08-01 DIAGNOSIS — F329 Major depressive disorder, single episode, unspecified: Secondary | ICD-10-CM | POA: Insufficient documentation

## 2018-08-01 DIAGNOSIS — M1712 Unilateral primary osteoarthritis, left knee: Secondary | ICD-10-CM | POA: Diagnosis present

## 2018-08-01 DIAGNOSIS — I2510 Atherosclerotic heart disease of native coronary artery without angina pectoris: Secondary | ICD-10-CM | POA: Insufficient documentation

## 2018-08-01 DIAGNOSIS — Z96651 Presence of right artificial knee joint: Secondary | ICD-10-CM | POA: Insufficient documentation

## 2018-08-01 DIAGNOSIS — F419 Anxiety disorder, unspecified: Secondary | ICD-10-CM | POA: Insufficient documentation

## 2018-08-01 DIAGNOSIS — D62 Acute posthemorrhagic anemia: Secondary | ICD-10-CM | POA: Insufficient documentation

## 2018-08-01 DIAGNOSIS — I252 Old myocardial infarction: Secondary | ICD-10-CM | POA: Insufficient documentation

## 2018-08-01 DIAGNOSIS — I509 Heart failure, unspecified: Secondary | ICD-10-CM | POA: Insufficient documentation

## 2018-08-01 DIAGNOSIS — G8929 Other chronic pain: Secondary | ICD-10-CM | POA: Diagnosis not present

## 2018-08-01 DIAGNOSIS — Z8673 Personal history of transient ischemic attack (TIA), and cerebral infarction without residual deficits: Secondary | ICD-10-CM | POA: Insufficient documentation

## 2018-08-01 DIAGNOSIS — Z87891 Personal history of nicotine dependence: Secondary | ICD-10-CM | POA: Insufficient documentation

## 2018-08-01 DIAGNOSIS — N189 Chronic kidney disease, unspecified: Secondary | ICD-10-CM | POA: Diagnosis not present

## 2018-08-01 DIAGNOSIS — I130 Hypertensive heart and chronic kidney disease with heart failure and stage 1 through stage 4 chronic kidney disease, or unspecified chronic kidney disease: Secondary | ICD-10-CM | POA: Insufficient documentation

## 2018-08-01 DIAGNOSIS — F429 Obsessive-compulsive disorder, unspecified: Secondary | ICD-10-CM | POA: Insufficient documentation

## 2018-08-01 DIAGNOSIS — J449 Chronic obstructive pulmonary disease, unspecified: Secondary | ICD-10-CM | POA: Insufficient documentation

## 2018-08-01 DIAGNOSIS — E785 Hyperlipidemia, unspecified: Secondary | ICD-10-CM | POA: Diagnosis not present

## 2018-08-01 DIAGNOSIS — G8918 Other acute postprocedural pain: Secondary | ICD-10-CM | POA: Diagnosis not present

## 2018-08-01 DIAGNOSIS — Z79899 Other long term (current) drug therapy: Secondary | ICD-10-CM | POA: Insufficient documentation

## 2018-08-01 DIAGNOSIS — K219 Gastro-esophageal reflux disease without esophagitis: Secondary | ICD-10-CM | POA: Insufficient documentation

## 2018-08-01 DIAGNOSIS — Z96652 Presence of left artificial knee joint: Secondary | ICD-10-CM

## 2018-08-01 HISTORY — PX: TOTAL KNEE ARTHROPLASTY: SHX125

## 2018-08-01 LAB — TYPE AND SCREEN
ABO/RH(D): O POS
Antibody Screen: NEGATIVE

## 2018-08-01 SURGERY — ARTHROPLASTY, KNEE, TOTAL
Anesthesia: Spinal | Site: Knee | Laterality: Left

## 2018-08-01 MED ORDER — LIDOCAINE 2% (20 MG/ML) 5 ML SYRINGE
INTRAMUSCULAR | Status: AC
  Filled 2018-08-01: qty 5

## 2018-08-01 MED ORDER — DEXAMETHASONE SODIUM PHOSPHATE 10 MG/ML IJ SOLN
10.0000 mg | Freq: Once | INTRAMUSCULAR | Status: AC
  Administered 2018-08-02: 10 mg via INTRAVENOUS
  Filled 2018-08-01: qty 1

## 2018-08-01 MED ORDER — SODIUM CHLORIDE 0.9 % IR SOLN
Status: DC | PRN
  Administered 2018-08-01: 1000 mL

## 2018-08-01 MED ORDER — CHLORHEXIDINE GLUCONATE 4 % EX LIQD: 60.0000 mL | Freq: Once | CUTANEOUS | Status: DC

## 2018-08-01 MED ORDER — SERTRALINE HCL 100 MG PO TABS
200.0000 mg | ORAL_TABLET | Freq: Every day | ORAL | Status: DC
  Administered 2018-08-02: 200 mg via ORAL
  Filled 2018-08-01: qty 2

## 2018-08-01 MED ORDER — TRANEXAMIC ACID-NACL 1000-0.7 MG/100ML-% IV SOLN
1000.0000 mg | INTRAVENOUS | Status: AC
  Administered 2018-08-01: 1000 mg via INTRAVENOUS
  Filled 2018-08-01: qty 100

## 2018-08-01 MED ORDER — ALUM & MAG HYDROXIDE-SIMETH 200-200-20 MG/5ML PO SUSP: 30.0000 mL | ORAL | Status: DC | PRN

## 2018-08-01 MED ORDER — CEFAZOLIN SODIUM-DEXTROSE 2-4 GM/100ML-% IV SOLN
2.0000 g | INTRAVENOUS | Status: AC
  Administered 2018-08-01: 2 g via INTRAVENOUS
  Filled 2018-08-01: qty 100

## 2018-08-01 MED ORDER — MENTHOL 3 MG MT LOZG: 1.0000 | LOZENGE | OROMUCOSAL | Status: DC | PRN

## 2018-08-01 MED ORDER — PHENOL 1.4 % MT LIQD
1.0000 | OROMUCOSAL | Status: DC | PRN
  Filled 2018-08-01: qty 177

## 2018-08-01 MED ORDER — OXYCODONE HCL 5 MG PO TABS: 5.0000 mg | ORAL_TABLET | Freq: Once | ORAL | Status: DC | PRN

## 2018-08-01 MED ORDER — LAMOTRIGINE 25 MG PO TABS
150.0000 mg | ORAL_TABLET | Freq: Every day | ORAL | Status: DC
  Administered 2018-08-01: 150 mg via ORAL
  Filled 2018-08-01: qty 2

## 2018-08-01 MED ORDER — TRANEXAMIC ACID-NACL 1000-0.7 MG/100ML-% IV SOLN: 1000.0000 mg | INTRAVENOUS | Status: DC

## 2018-08-01 MED ORDER — GABAPENTIN 300 MG PO CAPS
300.0000 mg | ORAL_CAPSULE | Freq: Three times a day (TID) | ORAL | Status: DC
  Administered 2018-08-01 – 2018-08-02 (×4): 300 mg via ORAL
  Filled 2018-08-01 (×4): qty 1

## 2018-08-01 MED ORDER — HYDROMORPHONE HCL 1 MG/ML IJ SOLN
0.5000 mg | INTRAMUSCULAR | Status: DC | PRN
  Administered 2018-08-01 – 2018-08-02 (×3): 1 mg via INTRAVENOUS
  Filled 2018-08-01 (×3): qty 1

## 2018-08-01 MED ORDER — ONDANSETRON HCL 4 MG/2ML IJ SOLN: 4.0000 mg | Freq: Four times a day (QID) | INTRAMUSCULAR | Status: DC | PRN

## 2018-08-01 MED ORDER — ASPIRIN EC 81 MG PO TBEC: 81.0000 mg | DELAYED_RELEASE_TABLET | Freq: Two times a day (BID) | ORAL | 0 refills | Status: AC

## 2018-08-01 MED ORDER — FENTANYL CITRATE (PF) 100 MCG/2ML IJ SOLN
INTRAMUSCULAR | Status: DC | PRN
  Administered 2018-08-01 (×2): 50 ug via INTRAVENOUS

## 2018-08-01 MED ORDER — BUPIVACAINE LIPOSOME 1.3 % IJ SUSP
INTRAMUSCULAR | Status: DC | PRN
  Administered 2018-08-01: 20 mL

## 2018-08-01 MED ORDER — METOCLOPRAMIDE HCL 5 MG PO TABS: 5.0000 mg | ORAL_TABLET | Freq: Three times a day (TID) | ORAL | Status: DC | PRN

## 2018-08-01 MED ORDER — ONDANSETRON HCL 4 MG/2ML IJ SOLN
INTRAMUSCULAR | Status: AC
  Filled 2018-08-01: qty 2

## 2018-08-01 MED ORDER — OXYCODONE-ACETAMINOPHEN 5-325 MG PO TABS: 1.0000 | ORAL_TABLET | ORAL | 0 refills | Status: DC | PRN

## 2018-08-01 MED ORDER — METOCLOPRAMIDE HCL 5 MG/ML IJ SOLN: 5.0000 mg | Freq: Three times a day (TID) | INTRAMUSCULAR | Status: DC | PRN

## 2018-08-01 MED ORDER — GABAPENTIN 300 MG PO CAPS: 300.0000 mg | ORAL_CAPSULE | Freq: Three times a day (TID) | ORAL | 2 refills | Status: DC

## 2018-08-01 MED ORDER — TRANEXAMIC ACID-NACL 1000-0.7 MG/100ML-% IV SOLN
1000.0000 mg | Freq: Once | INTRAVENOUS | Status: AC
  Administered 2018-08-01: 1000 mg via INTRAVENOUS
  Filled 2018-08-01: qty 100

## 2018-08-01 MED ORDER — BISACODYL 5 MG PO TBEC: 5.0000 mg | DELAYED_RELEASE_TABLET | Freq: Every day | ORAL | Status: DC | PRN

## 2018-08-01 MED ORDER — HYDROCHLOROTHIAZIDE 12.5 MG PO CAPS
12.5000 mg | ORAL_CAPSULE | Freq: Two times a day (BID) | ORAL | Status: DC
  Administered 2018-08-01 – 2018-08-02 (×2): 12.5 mg via ORAL
  Filled 2018-08-01 (×2): qty 1

## 2018-08-01 MED ORDER — CELECOXIB 200 MG PO CAPS: 200.0000 mg | ORAL_CAPSULE | Freq: Two times a day (BID) | ORAL | 2 refills | Status: DC

## 2018-08-01 MED ORDER — LIDOCAINE 2% (20 MG/ML) 5 ML SYRINGE
INTRAMUSCULAR | Status: DC | PRN
  Administered 2018-08-01: 60 mg via INTRAVENOUS

## 2018-08-01 MED ORDER — LACTATED RINGERS IV SOLN
INTRAVENOUS | Status: DC
  Administered 2018-08-01 (×3): via INTRAVENOUS

## 2018-08-01 MED ORDER — METHOCARBAMOL 500 MG IVPB - SIMPLE MED
500.0000 mg | Freq: Four times a day (QID) | INTRAVENOUS | Status: DC | PRN
  Filled 2018-08-01: qty 50

## 2018-08-01 MED ORDER — DEXAMETHASONE SODIUM PHOSPHATE 10 MG/ML IJ SOLN
INTRAMUSCULAR | Status: DC | PRN
  Administered 2018-08-01: 10 mg via INTRAVENOUS

## 2018-08-01 MED ORDER — SODIUM CHLORIDE (PF) 0.9 % IJ SOLN
INTRAMUSCULAR | Status: AC
  Filled 2018-08-01: qty 50

## 2018-08-01 MED ORDER — PHENYLEPHRINE 40 MCG/ML (10ML) SYRINGE FOR IV PUSH (FOR BLOOD PRESSURE SUPPORT)
PREFILLED_SYRINGE | INTRAVENOUS | Status: AC
  Filled 2018-08-01: qty 10

## 2018-08-01 MED ORDER — SODIUM CHLORIDE 0.9 % IR SOLN
Status: DC | PRN
  Administered 2018-08-01: 3000 mL

## 2018-08-01 MED ORDER — DEXAMETHASONE SODIUM PHOSPHATE 10 MG/ML IJ SOLN
INTRAMUSCULAR | Status: AC
  Filled 2018-08-01: qty 1

## 2018-08-01 MED ORDER — TIZANIDINE HCL 2 MG PO TABS: 2.0000 mg | ORAL_TABLET | Freq: Four times a day (QID) | ORAL | 0 refills | Status: DC | PRN

## 2018-08-01 MED ORDER — PHENYLEPHRINE 40 MCG/ML (10ML) SYRINGE FOR IV PUSH (FOR BLOOD PRESSURE SUPPORT)
PREFILLED_SYRINGE | INTRAVENOUS | Status: DC | PRN
  Administered 2018-08-01 (×3): 80 ug via INTRAVENOUS

## 2018-08-01 MED ORDER — WATER FOR IRRIGATION, STERILE IR SOLN
Status: DC | PRN
  Administered 2018-08-01: 2000 mL via SURGICAL_CAVITY

## 2018-08-01 MED ORDER — DOCUSATE SODIUM 100 MG PO CAPS
100.0000 mg | ORAL_CAPSULE | Freq: Two times a day (BID) | ORAL | Status: DC
  Administered 2018-08-01 – 2018-08-02 (×2): 100 mg via ORAL
  Filled 2018-08-01 (×2): qty 1

## 2018-08-01 MED ORDER — METHOCARBAMOL 500 MG PO TABS
500.0000 mg | ORAL_TABLET | Freq: Four times a day (QID) | ORAL | Status: DC | PRN
  Administered 2018-08-01 – 2018-08-02 (×3): 500 mg via ORAL
  Filled 2018-08-01 (×3): qty 1

## 2018-08-01 MED ORDER — SIMVASTATIN 20 MG PO TABS
40.0000 mg | ORAL_TABLET | Freq: Every day | ORAL | Status: DC
  Administered 2018-08-02: 40 mg via ORAL
  Filled 2018-08-01: qty 2

## 2018-08-01 MED ORDER — EPHEDRINE SULFATE-NACL 50-0.9 MG/10ML-% IV SOSY
PREFILLED_SYRINGE | INTRAVENOUS | Status: DC | PRN
  Administered 2018-08-01 (×2): 15 mg via INTRAVENOUS
  Administered 2018-08-01: 20 mg via INTRAVENOUS

## 2018-08-01 MED ORDER — BUPIVACAINE LIPOSOME 1.3 % IJ SUSP: 20.0000 mL | Freq: Once | INTRAMUSCULAR | Status: DC

## 2018-08-01 MED ORDER — ASPIRIN 81 MG PO CHEW
81.0000 mg | CHEWABLE_TABLET | Freq: Two times a day (BID) | ORAL | Status: DC
  Administered 2018-08-01 – 2018-08-02 (×2): 81 mg via ORAL
  Filled 2018-08-01 (×2): qty 1

## 2018-08-01 MED ORDER — ONDANSETRON HCL 4 MG PO TABS: 4.0000 mg | ORAL_TABLET | Freq: Four times a day (QID) | ORAL | Status: DC | PRN

## 2018-08-01 MED ORDER — OXYCODONE HCL 5 MG/5ML PO SOLN
5.0000 mg | Freq: Once | ORAL | Status: DC | PRN
  Filled 2018-08-01: qty 5

## 2018-08-01 MED ORDER — ONDANSETRON HCL 4 MG/2ML IJ SOLN
INTRAMUSCULAR | Status: DC | PRN
  Administered 2018-08-01: 4 mg via INTRAVENOUS

## 2018-08-01 MED ORDER — OXYCODONE-ACETAMINOPHEN 5-325 MG PO TABS
1.0000 | ORAL_TABLET | ORAL | Status: DC | PRN
  Administered 2018-08-01 – 2018-08-02 (×6): 2 via ORAL
  Filled 2018-08-01 (×6): qty 2

## 2018-08-01 MED ORDER — BUPIVACAINE-EPINEPHRINE (PF) 0.25% -1:200000 IJ SOLN
INTRAMUSCULAR | Status: DC | PRN
  Administered 2018-08-01: 30 mL

## 2018-08-01 MED ORDER — PANTOPRAZOLE SODIUM 40 MG PO TBEC
40.0000 mg | DELAYED_RELEASE_TABLET | Freq: Every day | ORAL | Status: DC
  Administered 2018-08-01 – 2018-08-02 (×2): 40 mg via ORAL
  Filled 2018-08-01 (×2): qty 1

## 2018-08-01 MED ORDER — LISINOPRIL 20 MG PO TABS
20.0000 mg | ORAL_TABLET | Freq: Two times a day (BID) | ORAL | Status: DC
  Administered 2018-08-01 – 2018-08-02 (×2): 20 mg via ORAL
  Filled 2018-08-01 (×2): qty 1

## 2018-08-01 MED ORDER — SODIUM CHLORIDE (PF) 0.9 % IJ SOLN
INTRAMUSCULAR | Status: DC | PRN
  Administered 2018-08-01: 70 mL

## 2018-08-01 MED ORDER — SODIUM CHLORIDE (PF) 0.9 % IJ SOLN
INTRAMUSCULAR | Status: AC
  Filled 2018-08-01: qty 20

## 2018-08-01 MED ORDER — PROPRANOLOL HCL 40 MG PO TABS
40.0000 mg | ORAL_TABLET | Freq: Two times a day (BID) | ORAL | Status: DC
  Administered 2018-08-01: 40 mg via ORAL
  Filled 2018-08-01 (×2): qty 1

## 2018-08-01 MED ORDER — KCL IN DEXTROSE-NACL 20-5-0.45 MEQ/L-%-% IV SOLN
INTRAVENOUS | Status: DC
  Administered 2018-08-01 – 2018-08-02 (×3): via INTRAVENOUS
  Filled 2018-08-01 (×5): qty 1000

## 2018-08-01 MED ORDER — TRANEXAMIC ACID 1000 MG/10ML IV SOLN
INTRAVENOUS | Status: DC | PRN
  Administered 2018-08-01: 2000 mg via TOPICAL

## 2018-08-01 MED ORDER — PROPOFOL 10 MG/ML IV BOLUS
INTRAVENOUS | Status: AC
  Filled 2018-08-01: qty 40

## 2018-08-01 MED ORDER — LISINOPRIL-HYDROCHLOROTHIAZIDE 20-12.5 MG PO TABS: 1.0000 | ORAL_TABLET | Freq: Two times a day (BID) | ORAL | Status: DC

## 2018-08-01 MED ORDER — ROPIVACAINE HCL 5 MG/ML IJ SOLN
INTRAMUSCULAR | Status: DC | PRN
  Administered 2018-08-01: 20 mL via PERINEURAL

## 2018-08-01 MED ORDER — POLYETHYLENE GLYCOL 3350 17 G PO PACK: 17.0000 g | PACK | Freq: Every day | ORAL | Status: DC | PRN

## 2018-08-01 MED ORDER — FENTANYL CITRATE (PF) 100 MCG/2ML IJ SOLN
INTRAMUSCULAR | Status: AC
  Filled 2018-08-01: qty 2

## 2018-08-01 MED ORDER — DIPHENHYDRAMINE HCL 12.5 MG/5ML PO ELIX: 12.5000 mg | ORAL_SOLUTION | ORAL | Status: DC | PRN

## 2018-08-01 MED ORDER — PROPOFOL 500 MG/50ML IV EMUL
INTRAVENOUS | Status: DC | PRN
  Administered 2018-08-01: 100 ug/kg/min via INTRAVENOUS

## 2018-08-01 MED ORDER — BUPIVACAINE IN DEXTROSE 0.75-8.25 % IT SOLN
INTRATHECAL | Status: DC | PRN
  Administered 2018-08-01: 2 mL via INTRATHECAL

## 2018-08-01 MED ORDER — FLEET ENEMA 7-19 GM/118ML RE ENEM: 1.0000 | ENEMA | Freq: Once | RECTAL | Status: DC | PRN

## 2018-08-01 MED ORDER — BUPIVACAINE-EPINEPHRINE (PF) 0.25% -1:200000 IJ SOLN
INTRAMUSCULAR | Status: AC
  Filled 2018-08-01: qty 30

## 2018-08-01 MED ORDER — MIDAZOLAM HCL 2 MG/2ML IJ SOLN
INTRAMUSCULAR | Status: AC
  Filled 2018-08-01: qty 2

## 2018-08-01 MED ORDER — EPHEDRINE 5 MG/ML INJ
INTRAVENOUS | Status: AC
  Filled 2018-08-01: qty 10

## 2018-08-01 MED ORDER — PROPOFOL 10 MG/ML IV BOLUS
INTRAVENOUS | Status: AC
  Filled 2018-08-01: qty 60

## 2018-08-01 MED ORDER — MIDAZOLAM HCL 5 MG/5ML IJ SOLN
INTRAMUSCULAR | Status: DC | PRN
  Administered 2018-08-01: 2 mg via INTRAVENOUS

## 2018-08-01 MED ORDER — FENTANYL CITRATE (PF) 100 MCG/2ML IJ SOLN: 25.0000 ug | INTRAMUSCULAR | Status: DC | PRN

## 2018-08-01 SURGICAL SUPPLY — 52 items
ATTUNE MED DOME PAT 41 KNEE (Knees) ×2 IMPLANT
ATTUNE PS FEM LT SZ 8 CEM KNEE (Femur) ×2 IMPLANT
ATTUNE PSRP INSR SZ8 7 KNEE (Insert) ×2 IMPLANT
BAG DECANTER FOR FLEXI CONT (MISCELLANEOUS) ×2 IMPLANT
BAG ZIPLOCK 12X15 (MISCELLANEOUS) ×2 IMPLANT
BANDAGE ACE 6X5 VEL STRL LF (GAUZE/BANDAGES/DRESSINGS) ×2 IMPLANT
BASE TIBIAL ATTUNE KNEE SZ9 (Knees) ×1 IMPLANT
BLADE SAG 18X100X1.27 (BLADE) ×2 IMPLANT
BLADE SAW SGTL 11.0X1.19X90.0M (BLADE) ×2 IMPLANT
BLADE SURG SZ10 CARB STEEL (BLADE) ×4 IMPLANT
BNDG ELASTIC 6X10 VLCR STRL LF (GAUZE/BANDAGES/DRESSINGS) IMPLANT
BOWL SMART MIX CTS (DISPOSABLE) ×2 IMPLANT
CEMENT HV SMART SET (Cement) ×4 IMPLANT
COVER SURGICAL LIGHT HANDLE (MISCELLANEOUS) ×2 IMPLANT
COVER WAND RF STERILE (DRAPES) ×2 IMPLANT
DECANTER SPIKE VIAL GLASS SM (MISCELLANEOUS) ×6 IMPLANT
DRAPE U-SHAPE 47X51 STRL (DRAPES) ×2 IMPLANT
DRSG AQUACEL AG ADV 3.5X10 (GAUZE/BANDAGES/DRESSINGS) ×2 IMPLANT
DURAPREP 26ML APPLICATOR (WOUND CARE) ×2 IMPLANT
ELECT REM PT RETURN 15FT ADLT (MISCELLANEOUS) ×2 IMPLANT
GLOVE BIO SURGEON STRL SZ7.5 (GLOVE) ×2 IMPLANT
GLOVE BIO SURGEON STRL SZ8.5 (GLOVE) ×2 IMPLANT
GLOVE BIOGEL PI IND STRL 7.5 (GLOVE) ×5 IMPLANT
GLOVE BIOGEL PI IND STRL 8 (GLOVE) ×1 IMPLANT
GLOVE BIOGEL PI IND STRL 9 (GLOVE) ×1 IMPLANT
GLOVE BIOGEL PI INDICATOR 7.5 (GLOVE) ×5
GLOVE BIOGEL PI INDICATOR 8 (GLOVE) ×1
GLOVE BIOGEL PI INDICATOR 9 (GLOVE) ×1
GLOVE SURG SS PI 7.0 STRL IVOR (GLOVE) ×2 IMPLANT
GOWN SPEC L3 XXLG W/TWL (GOWN DISPOSABLE) ×2 IMPLANT
GOWN STRL REUS W/TWL LRG LVL3 (GOWN DISPOSABLE) ×2 IMPLANT
GOWN STRL REUS W/TWL XL LVL3 (GOWN DISPOSABLE) ×4 IMPLANT
HANDPIECE INTERPULSE COAX TIP (DISPOSABLE) ×1
HOOD PEEL AWAY FLYTE STAYCOOL (MISCELLANEOUS) ×6 IMPLANT
NEEDLE HYPO 21X1.5 SAFETY (NEEDLE) ×4 IMPLANT
PACK ICE MAXI GEL EZY WRAP (MISCELLANEOUS) ×2 IMPLANT
PACK TOTAL KNEE CUSTOM (KITS) ×2 IMPLANT
PIN STEINMAN FIXATION KNEE (PIN) ×2 IMPLANT
PIN THREADED HEADED SIGMA (PIN) ×2 IMPLANT
PROTECTOR NERVE ULNAR (MISCELLANEOUS) ×2 IMPLANT
SET HNDPC FAN SPRY TIP SCT (DISPOSABLE) ×1 IMPLANT
SUT VIC AB 1 CTX 36 (SUTURE) ×1
SUT VIC AB 1 CTX36XBRD ANBCTR (SUTURE) ×1 IMPLANT
SUT VIC AB 2-0 CT1 27 (SUTURE) ×1
SUT VIC AB 2-0 CT1 TAPERPNT 27 (SUTURE) ×1 IMPLANT
SUT VIC AB 3-0 CT1 27 (SUTURE) ×1
SUT VIC AB 3-0 CT1 TAPERPNT 27 (SUTURE) ×1 IMPLANT
SYR CONTROL 10ML LL (SYRINGE) ×4 IMPLANT
TIBIAL BASE ATTUNE KNEE SZ9 (Knees) ×2 IMPLANT
TRAY FOLEY MTR SLVR 16FR STAT (SET/KITS/TRAYS/PACK) ×2 IMPLANT
WRAP KNEE MAXI GEL POST OP (GAUZE/BANDAGES/DRESSINGS) ×2 IMPLANT
YANKAUER SUCT BULB TIP 10FT TU (MISCELLANEOUS) ×2 IMPLANT

## 2018-08-01 NOTE — Transfer of Care (Signed)
Immediate Anesthesia Transfer of Care Note  Patient: Adam Harvey  Procedure(s) Performed: TOTAL KNEE ARTHROPLASTY (Left Knee)  Patient Location: PACU  Anesthesia Type:Spinal  Level of Consciousness: awake, alert  and oriented  Airway & Oxygen Therapy: Patient Spontanous Breathing and Patient connected to face mask oxygen  Post-op Assessment: Report given to RN and Post -op Vital signs reviewed and stable  Post vital signs: Reviewed and stable  Last Vitals:  Vitals Value Taken Time  BP 160/132 08/01/2018  9:30 AM  Temp    Pulse 66 08/01/2018  9:31 AM  Resp 18 08/01/2018  9:31 AM  SpO2 100 % 08/01/2018  9:31 AM  Vitals shown include unvalidated device data.  Last Pain:  Vitals:   08/01/18 0625  TempSrc:   PainSc: 0-No pain         Complications: No apparent anesthesia complications

## 2018-08-01 NOTE — Evaluation (Signed)
Physical Therapy Evaluation Patient Details Name: Adam Harvey Plitt MRN: [id number] DOB: 07/24/1961 Today's Date: 08/01/2018   History of Present Illness  57 yo male s/p L TKA 08/01/18. 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 He walked ~135 feet with a RW. Moderate pain with activity. Will follow and progress activity as tolerated. D/C plan is for home with HHPT f/u.     Follow Up Recommendations Follow surgeon's recommendation for DC plan and follow-up therapies    Equipment Recommendations  None recommended by PT    Recommendations for Other Services       Precautions / Restrictions Precautions Precautions: Fall Restrictions Weight Bearing Restrictions: No      Mobility  Bed Mobility   Bed Mobility: Supine to Sit;Sit to Supine     Supine to sit: Supervision;HOB elevated Sit to supine: Supervision;HOB elevated      Transfers Overall transfer level: Needs assistance Equipment used: Rolling walker (2 wheeled) Transfers: Sit to/from Stand Sit to Stand: Min guard;From elevated surface         General transfer comment: for safety. VCs safety, technique, hand/LE placement.   Ambulation/Gait Ambulation/Gait assistance: Min guard Gait Distance (Feet): 135 Feet Assistive device: Rolling walker (2 wheeled) Gait Pattern/deviations: Step-to pattern;Step-through pattern;Decreased stride length     General Gait Details: for safety. VCs safety, sequence. Pt transitioned to reciprocal gait pattern as distance progressed. No c/o lightheadedness.   Stairs            Wheelchair Mobility    Modified Rankin (Stroke Patients Only)       Balance Overall balance assessment: Mild deficits observed, not formally tested                                           Pertinent Vitals/Pain Pain Assessment: 0-10 Pain Score: 6  Pain Location: L knee Pain Descriptors /  Indicators: Aching;Sore;Discomfort Pain Intervention(s): Monitored during session;Repositioned;Ice applied    Home Living Family/patient expects to be discharged to:: Private residence Living Arrangements: Alone Available Help at Discharge: Family;Friend(s) Type of Home: House Home Access: Stairs to enter Entrance Stairs-Rails: None Entrance Stairs-Number of Steps: 1 Home Layout: One level Home Equipment: Environmental consultantWalker - 2 wheels;Bedside commode;Other (comment)      Prior Function Level of Independence: Independent               Hand Dominance        Extremity/Trunk Assessment   Upper Extremity Assessment Upper Extremity Assessment: Overall WFL for tasks assessed    Lower Extremity Assessment Lower Extremity Assessment: Generalized weakness    Cervical / Trunk Assessment Cervical / Trunk Assessment: Normal  Communication   Communication: No difficulties  Cognition Arousal/Alertness: Awake/alert Behavior During Therapy: WFL for tasks assessed/performed Overall Cognitive Status: Within Functional Limits for tasks assessed                                        General Comments      Exercises     Assessment/Plan    PT Assessment Patient needs continued PT services  PT Problem List Decreased strength;Decreased balance;Decreased range of motion;Decreased mobility;Decreased activity tolerance;Pain;Decreased cognition;Decreased knowledge of use of DME  PT Treatment Interventions DME instruction;Gait training;Stair training;Therapeutic exercise;Therapeutic activities;Functional mobility training;Balance training;Patient/family education    PT Goals (Current goals can be found in the Care Plan section)  Acute Rehab PT Goals Patient Stated Goal: home. regain independence PT Goal Formulation: With patient Time For Goal Achievement: 08/15/18 Potential to Achieve Goals: Good    Frequency 7X/week   Barriers to discharge        Co-evaluation                AM-PAC PT "6 Clicks" Mobility  Outcome Measure Help needed turning from your back to your side while in a flat bed without using bedrails?: None Help needed moving from lying on your back to sitting on the side of a flat bed without using bedrails?: A Little Help needed moving to and from a bed to a chair (including a wheelchair)?: A Little Help needed standing up from a chair using your arms (e.g., wheelchair or bedside chair)?: A Little Help needed to walk in hospital room?: A Little Help needed climbing 3-5 steps with a railing? : A Little 6 Click Score: 19    End of Session Equipment Utilized During Treatment: Gait belt Activity Tolerance: Patient tolerated treatment well Patient left: in bed;with call bell/phone within reach   PT Visit Diagnosis: Pain;Other abnormalities of gait and mobility (R26.89) Pain - Right/Left: Left Pain - part of body: Knee    Time: 1431-1458 PT Time Calculation (min) (ACUTE ONLY): 27 min   Charges:   PT Evaluation $PT Eval Low Complexity: 1 Low PT Treatments $Gait Training: 8-22 mins         Rebeca Alert, PT Acute Rehabilitation Services Pager: [phone] Office: [phone]

## 2018-08-01 NOTE — Progress Notes (Signed)
[facility]  Patient Status: New  [facility] is providing the following services: PT  If patient discharges after hours, please call [phone].   Avie EchevariaKaren Nussbaum 08/01/2018, 3:31 PM

## 2018-08-01 NOTE — Anesthesia Procedure Notes (Signed)
Spinal  Patient location during procedure: OR Start time: 08/01/2018 7:30 AM End time: 08/01/2018 7:32 AM Staffing Anesthesiologist: Achille RichHodierne, Marsela Kuan, MD Performed: anesthesiologist  Preanesthetic Checklist Completed: patient identified, surgical consent, pre-op evaluation, timeout performed, IV checked, risks and benefits discussed and monitors and equipment checked Spinal Block Patient position: sitting Prep: DuraPrep Patient monitoring: cardiac monitor, continuous pulse ox and blood pressure Approach: midline Location: L3-4 Injection technique: single-shot Needle Needle type: Pencan  Needle gauge: 24 G Needle length: 9 cm Assessment Sensory level: T10 Additional Notes Functioning IV was confirmed and monitors were applied. Sterile prep and drape, including hand hygiene and sterile gloves were used. The patient was positioned and the spine was prepped. The skin was anesthetized with lidocaine.  Free flow of clear CSF was obtained prior to injecting local anesthetic into the CSF.  The spinal needle aspirated freely following injection.  The needle was carefully withdrawn.  The patient tolerated the procedure well.

## 2018-08-01 NOTE — Anesthesia Procedure Notes (Signed)
Date/Time: 08/01/2018 7:27 AM Performed by: Jhonnie GarnerMarshall, Lochlan Grygiel M, CRNA Oxygen Delivery Method: Simple face mask

## 2018-08-01 NOTE — Anesthesia Preprocedure Evaluation (Signed)
Anesthesia Evaluation  Patient identified by MRN, date of birth, ID band Patient awake    Reviewed: Allergy & Precautions, H&P , NPO status , Patient's Chart, lab work & pertinent test results  Airway Mallampati: II   Neck ROM: full    Dental   Pulmonary former smoker,    breath sounds clear to auscultation       Cardiovascular hypertension,  Rhythm:regular Rate:Normal     Neuro/Psych PSYCHIATRIC DISORDERS Anxiety Depression    GI/Hepatic GERD  ,  Endo/Other    Renal/GU      Musculoskeletal  (+) Arthritis ,   Abdominal   Peds  Hematology   Anesthesia Other Findings   Reproductive/Obstetrics                             Anesthesia Physical Anesthesia Plan  ASA: II  Anesthesia Plan: General   Post-op Pain Management:  Regional for Post-op pain   Induction: Intravenous  PONV Risk Score and Plan: 2 and Ondansetron, Dexamethasone, Midazolam and Treatment may vary due to age or medical condition  Airway Management Planned: LMA  Additional Equipment:   Intra-op Plan:   Post-operative Plan:   Informed Consent: I have reviewed the patients History and Physical, chart, labs and discussed the procedure including the risks, benefits and alternatives for the proposed anesthesia with the patient or authorized representative who has indicated his/her understanding and acceptance.     Plan Discussed with: CRNA, Anesthesiologist and Surgeon  Anesthesia Plan Comments:         Anesthesia Quick Evaluation

## 2018-08-01 NOTE — Op Note (Signed)
PATIENT ID:      Adam Harvey  MRN:     [id number] DOB/AGE:    57/57/1962 / 57 y.o.       OPERATIVE REPORT    DATE OF PROCEDURE:  08/01/2018       PREOPERATIVE DIAGNOSIS:   LEFT KNEE OSTEOARTHRITIS      Estimated body mass index is 33.91 kg/m as calculated from the following:   Height as of this encounter: 6' (1.829 m).   Weight as of this encounter: 113.4 kg.                                                        POSTOPERATIVE DIAGNOSIS:   LEFT KNEE OSTEOARTHRITIS                                                                      PROCEDURE:  Procedure(s): TOTAL KNEE ARTHROPLASTY Using DepuyAttune RP implants #8L Femur, #9Tibia, 5 mm Attune RP bearing, 41 Patella     SURGEON: Nestor Lewandowsky    ASSISTANT:   Tomi Likens. [employer]   (Present and scrubbed throughout the case, critical for assistance with exposure, retraction, instrumentation, and closure.)         ANESTHESIA: Spinal, 20cc Exparel, 50cc 0.25% Marcaine  EBL: 200 cc  FLUID REPLACEMENT: 1500 cc crystaloid  Tourniquet Time: None  Drains: Foley  Tranexamic Acid: 1gm IV, 2gm topical  Exparel: 266mg    COMPLICATIONS:  None         INDICATIONS FOR PROCEDURE: The patient has  LEFT KNEE OSTEOARTHRITIS, Var deformities, XR shows bone on bone arthritis, lateral subluxation of tibia. Patient has failed all conservative measures including anti-inflammatory medicines, narcotics, attempts at exercise and weight loss, cortisone injections and viscosupplementation.  Risks and benefits of surgery have been discussed, questions answered.   DESCRIPTION OF PROCEDURE: The patient identified by armband, received  IV antibiotics, in the holding area at [facility]. Patient taken to the operating room, appropriate anesthetic monitors were attached, and Spinal anesthesia was  induced. IV Tranexamic acid was given.Tourniquet applied high to the operative thigh. Lateral post and foot positioner applied to the table, the lower extremity was  then prepped and draped in usual sterile fashion from the toes to the tourniquet. Time-out procedure was performed. The skin and subcutaneous tissue along the incision was injected with 20 cc of a mixture of Exparel and Marcaine solution, using a 20-gauge by 1-1/2 inch needle. We began the operation, with the knee flexed 130 degrees, by re-creating the old medial parapatellar incision, starting at handbreadth above the patella going over the patella 1 cm medial to and 4 cm distal to the tibial tubercle. Small bleeders in the skin and the subcutaneous tissue identified and cauterized. Transverse retinaculum was incised and reflected medially and a medial parapatellar arthrotomy was accomplished. the patella was everted and theprepatellar fat pad resected. The superficial medial collateral ligament was then elevated from anterior to posterior along the proximal flare of the tibia and anterior half of the menisci resected. The knee was hyperflexed exposing bone on  bone arthritis. Peripheral and notch osteophytes as well as the cruciate ligaments were then resected. We continued to work our way around posteriorly along the proximal tibia, and externally rotated the tibia subluxing it out from underneath the femur. A McHale retractor was placed through the notch and a lateral Hohmann retractor placed, and we then drilled through the proximal tibia in line with the axis of the tibia followed by an intramedullary guide rod and 2-degree posterior slope cutting guide. The tibial cutting guide, 4 degree posterior sloped, was pinned into place allowing resection of 0 mm of bone medially and 12 mm of bone laterally. Satisfied with the tibial resection, we then entered the distal femur 2 mm anterior to the PCL origin with the intramedullary guide rod and applied the distal femoral cutting guide set at 9 mm, with 5 degrees of valgus. This was pinned along the epicondylar axis. At this point, the distal femoral cut was  accomplished without difficulty. We then sized for a #8L femoral component and pinned the guide in 3 degrees of external rotation. The chamfer cutting guide was pinned into place. The anterior, posterior, and chamfer cuts were accomplished without difficulty followed by the Attune RP box cutting guide and the box cut. We also removed posterior osteophytes from the posterior femoral condyles. The posterior capsule was injected with Exparel solution. The knee was brought into full extension. We checked our extension gap and fit a 7 mm bearing. Distracting in extension with a lamina spreader,  bleeders in the posterior capsule, Posterior medial and posterior lateral right down and cauterized.  The transexamic acid-soaked sponge was then placed in the gap of the knee and extension. The knee was flexed 30. The posterior patella cut was accomplished with the 9.5 mm Attune cutting guide, sized for a 41mm dome, and the fixation pegs drilled.The knee was then once again hyperflexed exposing the proximal tibia. We sized for a # 9 tibial base plate, applied the smokestack and the conical reamer followed by the the Delta fin keel punch. We then hammered into place the Attune RP trial femoral component, drilled the lugs, inserted a  7 mm trial bearing, trial patellar button, and took the knee through range of motion from 0-130 degrees. Medial and lateral ligamentous stability was checked. No thumb pressure was required for patellar Tracking. The tourniquet was not used. All trial components were removed, mating surfaces irrigated with pulse lavage, and dried with suction and sponges. 10 cc of the Exparel solution was applied to the cancellus bone of the patella distal femur and proximal tibia.  After waiting 30 seconds, the bony surfaces were again, dried with sponges. A double batch of DePuy HV cement was mixed and applied to all bony metallic mating surfaces except for the posterior condyles of the femur itself. In order, we  hammered into place the tibial tray and removed excess cement, the femoral component and removed excess cement. The final Attune RP bearing was inserted, and the knee brought to full extension with compression. The patellar button was clamped into place, and excess cement removed. The knee was held at 30 flexion with compression, while the cement cured. The wound was irrigated out with normal saline solution pulse lavage. The rest of the Exparel was injected into the parapatellar arthrotomy, subcutaneous tissues, and periosteal tissues. The parapatellar arthrotomy was closed with running #1 Vicryl suture. The subcutaneous tissue with 0 and 2-0 undyed Vicryl suture, and the skin with running 3-0 SQ vicryl. An Aquacil and Ace  wrap were applied. The patient was taken to recovery room without difficulty.   Nestor LewandowskyFrank J Kataleah Bejar 08/01/2018, 9:06 AM

## 2018-08-01 NOTE — Interval H&P Note (Signed)
History and Physical Interval Note:  08/01/2018 7:00 AM  Adam Harvey  has presented today for surgery, with the diagnosis of LEFT KNEE OSTEOARTHRITIS  The various methods of treatment have been discussed with the patient and family. After consideration of risks, benefits and other options for treatment, the patient has consented to  Procedure(s): TOTAL KNEE ARTHROPLASTY (Left) as a surgical intervention .  The patient's history has been reviewed, patient examined, no change in status, stable for surgery.  I have reviewed the patient's chart and labs.  Questions were answered to the patient's satisfaction.     Nestor LewandowskyFrank J Landi Biscardi

## 2018-08-01 NOTE — Discharge Instructions (Signed)

## 2018-08-01 NOTE — Anesthesia Procedure Notes (Signed)
Anesthesia Regional Block: Adductor canal block   Pre-Anesthetic Checklist: ,, timeout performed, Correct Patient, Correct Site, Correct Laterality, Correct Procedure, Correct Position, site marked, Risks and benefits discussed,  Surgical consent,  Pre-op evaluation,  At surgeon's request and post-op pain management  Laterality: Left  Prep: chloraprep       Needles:  Injection technique: Single-shot  Needle Type: Echogenic Needle     Needle Length: 9cm  Needle Gauge: 21     Additional Needles:   Narrative:  Start time: 08/01/2018 7:06 AM End time: 08/01/2018 7:14 AM Injection made incrementally with aspirations every 5 mL.  Performed by: Personally  Anesthesiologist: Achille RichHodierne, Rissie Sculley, MD  Additional Notes: Pt tolerated the procedure well.

## 2018-08-02 ENCOUNTER — Encounter (HOSPITAL_COMMUNITY): Payer: Self-pay | Admitting: Orthopedic Surgery

## 2018-08-02 DIAGNOSIS — Z7982 Long term (current) use of aspirin: Secondary | ICD-10-CM | POA: Diagnosis not present

## 2018-08-02 DIAGNOSIS — J449 Chronic obstructive pulmonary disease, unspecified: Secondary | ICD-10-CM | POA: Diagnosis not present

## 2018-08-02 DIAGNOSIS — I130 Hypertensive heart and chronic kidney disease with heart failure and stage 1 through stage 4 chronic kidney disease, or unspecified chronic kidney disease: Secondary | ICD-10-CM | POA: Diagnosis not present

## 2018-08-02 DIAGNOSIS — I2510 Atherosclerotic heart disease of native coronary artery without angina pectoris: Secondary | ICD-10-CM | POA: Diagnosis not present

## 2018-08-02 DIAGNOSIS — M1712 Unilateral primary osteoarthritis, left knee: Secondary | ICD-10-CM | POA: Diagnosis not present

## 2018-08-02 DIAGNOSIS — K219 Gastro-esophageal reflux disease without esophagitis: Secondary | ICD-10-CM | POA: Diagnosis not present

## 2018-08-02 DIAGNOSIS — N189 Chronic kidney disease, unspecified: Secondary | ICD-10-CM | POA: Diagnosis not present

## 2018-08-02 DIAGNOSIS — G8929 Other chronic pain: Secondary | ICD-10-CM | POA: Diagnosis not present

## 2018-08-02 DIAGNOSIS — E785 Hyperlipidemia, unspecified: Secondary | ICD-10-CM | POA: Diagnosis not present

## 2018-08-02 DIAGNOSIS — D62 Acute posthemorrhagic anemia: Secondary | ICD-10-CM | POA: Diagnosis not present

## 2018-08-02 DIAGNOSIS — F429 Obsessive-compulsive disorder, unspecified: Secondary | ICD-10-CM | POA: Diagnosis not present

## 2018-08-02 DIAGNOSIS — F329 Major depressive disorder, single episode, unspecified: Secondary | ICD-10-CM | POA: Diagnosis not present

## 2018-08-02 DIAGNOSIS — I252 Old myocardial infarction: Secondary | ICD-10-CM | POA: Diagnosis not present

## 2018-08-02 DIAGNOSIS — F419 Anxiety disorder, unspecified: Secondary | ICD-10-CM | POA: Diagnosis not present

## 2018-08-02 DIAGNOSIS — I509 Heart failure, unspecified: Secondary | ICD-10-CM | POA: Diagnosis not present

## 2018-08-02 DIAGNOSIS — E1122 Type 2 diabetes mellitus with diabetic chronic kidney disease: Secondary | ICD-10-CM | POA: Diagnosis not present

## 2018-08-02 LAB — CBC
HCT: 37.8 % — ABNORMAL LOW (ref 39.0–52.0)
Hemoglobin: 12.9 g/dL — ABNORMAL LOW (ref 13.0–17.0)
MCH: 31.8 pg (ref 26.0–34.0)
MCHC: 34.1 g/dL (ref 30.0–36.0)
MCV: 93.1 fL (ref 80.0–100.0)
Platelets: 205 10*3/uL (ref 150–400)
RBC: 4.06 MIL/uL — ABNORMAL LOW (ref 4.22–5.81)
RDW: 12.9 % (ref 11.5–15.5)
WBC: 14.6 10*3/uL — ABNORMAL HIGH (ref 4.0–10.5)
nRBC: 0 % (ref 0.0–0.2)

## 2018-08-02 LAB — BASIC METABOLIC PANEL
Anion gap: 7 (ref 5–15)
BUN: 26 mg/dL — ABNORMAL HIGH (ref 6–20)
CO2: 27 mmol/L (ref 22–32)
Calcium: 9 mg/dL (ref 8.9–10.3)
Chloride: 102 mmol/L (ref 98–111)
Creatinine, Ser: 1.1 mg/dL (ref 0.61–1.24)
GFR calc Af Amer: >60 mL/min (ref >60)
GFR calc non Af Amer: >60 mL/min (ref >60)
Glucose, Bld: 138 mg/dL — ABNORMAL HIGH (ref 70–99)
Potassium: 5 mmol/L (ref 3.5–5.1)
Sodium: 136 mmol/L (ref 135–145)

## 2018-08-02 NOTE — Anesthesia Postprocedure Evaluation (Signed)
Anesthesia Post Note  Patient: Adam Harvey  Procedure(s) Performed: TOTAL KNEE ARTHROPLASTY (Left Knee)     Patient location during evaluation: PACU Anesthesia Type: Spinal and Regional Level of consciousness: oriented and awake and alert Pain management: pain level controlled Vital Signs Assessment: post-procedure vital signs reviewed and stable Respiratory status: spontaneous breathing, respiratory function stable and patient connected to nasal cannula oxygen Cardiovascular status: blood pressure returned to baseline and stable Postop Assessment: no headache, no backache and no apparent nausea or vomiting Anesthetic complications: no    Last Vitals:  Vitals:   08/02/18 0146 08/02/18 0524  BP: 127/76 120/84  Pulse: (!) 53 (!) 49  Resp:    Temp: 36.5 C 36.4 C  SpO2: 99% 100%    Last Pain:  Vitals:   08/02/18 0726  TempSrc:   PainSc: 4                  Saramarie Stinger S

## 2018-08-02 NOTE — Progress Notes (Signed)
Physical Therapy Treatment Patient Details Name: Adam RainwaterHunter Harvey MRN: [id number] DOB: [birth year] Today's Date: 08/02/2018    History of Present Illness 57 yo male s/p L TKA 08/01/18. 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 Reviewed/practiced exercises, gait training, and stair training. Issued HEP for pt to perform 2x/day until he begins HHPT or OPPT (pt unsure at this time). All education completed. Okay to d/c from PT standpoint.     Follow Up Recommendations  Follow surgeon's recommendation for DC plan and follow-up therapies     Equipment Recommendations  None recommended by PT    Recommendations for Other Services       Precautions / Restrictions Precautions Precautions: Fall Restrictions Weight Bearing Restrictions: No Other Position/Activity Restrictions: WBAT    Mobility  Bed Mobility   Bed Mobility: Supine to Sit;Sit to Supine     Supine to sit: Modified independent (Device/Increase time) Sit to supine: Modified independent (Device/Increase time)      Transfers Overall transfer level: Needs assistance Equipment used: Rolling walker (2 wheeled) Transfers: Sit to/from Stand Sit to Stand: Supervision         General transfer comment: for safety. VCs safety, technique, hand/LE placement.   Ambulation/Gait Ambulation/Gait assistance: Supervision Gait Distance (Feet): 140 Feet Assistive device: Rolling walker (2 wheeled) Gait Pattern/deviations: Step-through pattern;Decreased stride length     General Gait Details: for safety.    Stairs Stairs: Yes Stairs assistance: Min guard Stair Management: Step to pattern;With walker Number of Stairs: 1 General stair comments: VCs safety, technique, sequence. Close guard for safety.    Wheelchair Mobility    Modified Rankin (Stroke Patients Only)       Balance                                            Cognition  Arousal/Alertness: Awake/alert Behavior During Therapy: WFL for tasks assessed/performed Overall Cognitive Status: Within Functional Limits for tasks assessed                                        Exercises Total Joint Exercises Ankle Circles/Pumps: AROM;Both;10 reps;Supine Quad Sets: AROM;Both;10 reps;Supine Straight Leg Raises: AROM;Left;10 reps;Supine Knee Flexion: AROM;Left;10 reps;Seated Goniometric ROM: ~0-70    General Comments        Pertinent Vitals/Pain Pain Assessment: 0-10 Pain Score: 6  Pain Location: L knee Pain Descriptors / Indicators: Aching;Sore;Discomfort Pain Intervention(s): Monitored during session;Ice applied    Home Living                      Prior Function            PT Goals (current goals can now be found in the care plan section) Progress towards PT goals: Progressing toward goals    Frequency    7X/week      PT Plan Current plan remains appropriate    Co-evaluation              AM-PAC PT "6 Clicks" Mobility   Outcome Measure  Help needed turning from your back to your side while in a flat bed without using bedrails?: None Help needed moving from lying on your back to sitting on the side of  a flat bed without using bedrails?: None Help needed moving to and from a bed to a chair (including a wheelchair)?: None Help needed standing up from a chair using your arms (e.g., wheelchair or bedside chair)?: None Help needed to walk in hospital room?: A Little Help needed climbing 3-5 steps with a railing? : A Little 6 Click Score: 22    End of Session Equipment Utilized During Treatment: Gait belt Activity Tolerance: Patient tolerated treatment well Patient left: in bed;with call bell/phone within reach   PT Visit Diagnosis: Pain;Other abnormalities of gait and mobility (R26.89) Pain - Right/Left: Left Pain - part of body: Knee     Time: 1610-9604 PT Time Calculation (min) (ACUTE ONLY): 23  min  Charges:  $Gait Training: 8-22 mins $Therapeutic Exercise: 8-22 mins                        Rebeca Alert, PT Acute Rehabilitation Services Pager: [phone] Office: [phone]

## 2018-08-02 NOTE — Progress Notes (Signed)
Reviewed d/c instructions w/ patient. Verbalized understanding and all questions answered. Patient expressed concerns about discrepancy between sig on RX vs what he's been taking here. Verified w/ Dannielle BurnEric Phillips PA. Dannielle Burnric Phillips responds "He can take two tabs intermittently, but he should not take two every dose."  I explained this to the patient who verbalized understanding but replied, "I understand, I watch the news, I get it." All belongings w/ patient. Escorted to pov via w/c.

## 2018-08-02 NOTE — Care Management Note (Signed)
Case Management Note  Patient Details  Name: Adam RainwaterHunter Harvey MRN: [id number] Date of Birth: Oct 21, 1960  Subjective/Objective:      Discharge planning, spoke with patient at beside. [facility], PT to eval and treat.  Action/Plan: Contacted [facility] for referral. Has DME. [phone]               Expected Discharge Date:  08/02/18               Expected Discharge Plan:  Home w Home Health Services  In-House Referral:  NA  Discharge planning Services  CM Consult  Post Acute Care Choice:  Home Health Choice offered to:  Patient  DME Arranged:  [name] DME Agency:  NA  HH Arranged:  PT HH Agency:  [facility]  Status of Service:  Completed, signed off  If discussed at Long Length of Stay Meetings, dates discussed:    Additional Comments:  Alexis Goodelleele, Emonie Espericueta K, RN 08/02/2018, 9:53 AM

## 2018-08-02 NOTE — Discharge Summary (Signed)
Patient ID: Adam Harvey MRN: [id number] DOB/AGE: March 27, 1961 57 y.o.  Admit date: 08/01/2018 Discharge date: 08/02/2018  Admission Diagnoses:  Principal Problem:   Degenerative arthritis of left knee Active Problems:   S/P total knee arthroplasty, left   Discharge Diagnoses:  Same  Past Medical History:  Diagnosis Date  . Alcoholism (HCC)    sober since 01-2011   . Anxiety   . Arthritis   . Complication of anesthesia    unsuccessful spinal with right knee replacement, used general anesthesia  . Depression   . Diverticulosis 2012  . GERD (gastroesophageal reflux disease)   . Hemorrhoids   . Hyperlipidemia   . Hypertension   . Internal hemorrhoids 2012  . Obsessive compulsive disorder   . Osteoarthritis   . Rotator cuff tear, left   . Tear of right rotator cuff 08/03/2014  . Wears contact lenses     Surgeries: Procedure(s): TOTAL KNEE ARTHROPLASTY on 08/01/2018   Consultants:   Discharged Condition: Improved  Hospital Course: Adam RainwaterHunter Belmonte is an 57 y.o. male who was admitted 08/01/2018 for operative treatment ofDegenerative arthritis of left knee. Patient has severe unremitting pain that affects sleep, daily activities, and work/hobbies. After pre-op clearance the patient was taken to the operating room on 08/01/2018 and underwent  Procedure(s): TOTAL KNEE ARTHROPLASTY.    Patient was given perioperative antibiotics:  Anti-infectives (From admission, onward)   Start     Dose/Rate Route Frequency Ordered Stop   08/01/18 0600  ceFAZolin (ANCEF) IVPB 2g/100 mL premix     2 g 200 mL/hr over 30 Minutes Intravenous On call to O.R. 08/01/18 0545 08/01/18 0735       Patient was given sequential compression devices, early ambulation, and chemoprophylaxis to prevent DVT.  Patient benefited maximally from hospital stay and there were no complications.    Recent vital signs:  Patient Vitals for the past 24 hrs:  BP Temp Temp src Pulse Resp SpO2  08/02/18 0524 120/84  97.6 F (36.4 C) no documentation (Abnormal) 49 no documentation 100 %  08/02/18 0146 127/76 97.7 F (36.5 C) no documentation (Abnormal) 53 no documentation 99 %  08/01/18 2205 119/76 (Abnormal) 97.5 F (36.4 C) no documentation 60 no documentation 97 %  08/01/18 1829 122/75 98 F (36.7 C) Oral 72 16 97 %  08/01/18 1336 120/69 no documentation Oral 63 15 97 %  08/01/18 1241 112/66 97.7 F (36.5 C) Oral 64 15 96 %  08/01/18 1139 108/71 97.8 F (36.6 C) Oral (Abnormal) 59 no documentation 100 %  08/01/18 1046 105/60 97.7 F (36.5 C) Oral (Abnormal) 55 14 100 %  08/01/18 1030 107/69 97.6 F (36.4 C) no documentation (Abnormal) 56 16 100 %  08/01/18 1015 97/60 no documentation no documentation (Abnormal) 56 14 100 %  08/01/18 1000 (Abnormal) 91/58 no documentation no documentation 66 19 90 %  08/01/18 0945 94/68 no documentation no documentation 60 15 100 %  08/01/18 0930 91/64 (Abnormal) 97.5 F (36.4 C) no documentation 65 16 98 %     Recent laboratory studies:  Recent Labs    08/02/18 0458  WBC 14.6*  HGB 12.9*  HCT 37.8*  PLT 205  NA 136  K 5.0  CL 102  CO2 27  BUN 26*  CREATININE 1.10  GLUCOSE 138*  CALCIUM 9.0     Discharge Medications:   Allergies as of 08/02/2018   No Known Allergies     Medication List    Stop taking these medications   meloxicam 15  MG tablet Commonly known as:  MOBIC     Take these medications   aspirin EC 81 MG tablet Take 1 tablet (81 mg total) by mouth 2 (two) times daily. What changed:  when to take this   celecoxib 200 MG capsule Commonly known as:  CELEBREX Take 1 capsule (200 mg total) by mouth 2 (two) times daily.   gabapentin 300 MG capsule Commonly known as:  NEURONTIN Take 1 capsule (300 mg total) by mouth 3 (three) times daily.   lamoTRIgine 150 MG tablet Commonly known as:  LAMICTAL Take 150 mg by mouth at bedtime.   lisinopril-hydrochlorothiazide 20-12.5 MG tablet Commonly known as:   PRINZIDE,ZESTORETIC Take 1 tablet by mouth 2 (two) times daily.   multivitamin with minerals tablet Take 1 tablet by mouth daily.   omeprazole 20 MG capsule Commonly known as:  PRILOSEC Take 20 mg by mouth 2 (two) times daily.   oxyCODONE-acetaminophen 5-325 MG tablet Commonly known as:  PERCOCET/ROXICET Take 1 tablet by mouth every 4 (four) hours as needed for severe pain.   propranolol 40 MG tablet Commonly known as:  INDERAL Take 40 mg by mouth 2 (two) times daily.   sertraline 100 MG tablet Commonly known as:  ZOLOFT Take 200 mg by mouth daily.   simvastatin 40 MG tablet Commonly known as:  ZOCOR Take 40 mg by mouth daily.   tiZANidine 2 MG tablet Commonly known as:  ZANAFLEX Take 1 tablet (2 mg total) by mouth every 6 (six) hours as needed.        Durable Medical Equipment  (From admission, onward)         Start     Ordered   08/01/18 1054  DME Walker rolling  Once    Question:  Patient needs a walker to treat with the following condition  Answer:  Status post total left knee replacement   08/01/18 1053   08/01/18 1054  DME 3 n 1  Once     08/01/18 1053           Discharge Care Instructions  (From admission, onward)         Start     Ordered   08/02/18 0000  Change dressing    Comments:  Change dressing Only if drainage exceeds 40% of window on dressing   08/02/18 0741          Diagnostic Studies: Dg Chest 2 View  Result Date: 07/26/2018 CLINICAL DATA:  Preop for knee surgery EXAM: CHEST - 2 VIEW COMPARISON:  Chest x-ray of 08/12/2017 FINDINGS: No active infiltrate or effusion is seen. There is mild peribronchial thickening which may indicate bronchitis in this patient with previous smoking history. The heart is within upper limits of normal and stable. No bony abnormality is seen. IMPRESSION: No active cardiopulmonary disease. Mild peribronchial thickening may indicate bronchitis, possibly chronic. Electronically Signed   By: Dwyane Dee M.D.    On: 07/26/2018 13:43    Disposition: Discharge disposition: 01-Home or Self Care       Discharge Instructions    Call MD / Call 911   Complete by:  As directed    If you experience chest pain or shortness of breath, CALL 911 and be transported to the hospital emergency room.  If you develope a fever above 101 F, pus (white drainage) or increased drainage or redness at the wound, or calf pain, call your surgeon's office.   Change dressing   Complete by:  As directed  Change dressing Only if drainage exceeds 40% of window on dressing   Constipation Prevention   Complete by:  As directed    Drink plenty of fluids.  Prune juice may be helpful.  You may use a stool softener, such as Colace (over the counter) 100 mg twice a day.  Use MiraLax (over the counter) for constipation as needed.   Diet - low sodium heart healthy   Complete by:  As directed    Increase activity slowly as tolerated   Complete by:  As directed       Follow-up Information    Gean Birchwood, MD In 2 weeks.   Specialty:  Orthopedic Surgery Contact information: [address] [place] [address] [phone]            Signed: Nestor Lewandowsky 08/02/2018, 7:42 AM

## 2018-08-02 NOTE — Progress Notes (Signed)
PATIENT ID: Adam RainwaterHunter Morrissette  MRN: [id number]  DOB/AGE:  [birth year] / 57 y.o.  1 Day Post-Op Procedure(s) (LRB): TOTAL KNEE ARTHROPLASTY (Left)    PROGRESS NOTE Subjective: Patient is alert, oriented, no Nausea, no Vomiting, yes passing gas. Taking PO well. Denies SOB, Chest or Calf Pain. Using Incentive Spirometer, PAS in place. Ambulat 135'e, Patient reports pain as 2/10 .    Objective: Vital signs in last 24 hours: Vitals:   08/01/18 1829 08/01/18 2205 08/02/18 0146 08/02/18 0524  BP: 122/75 119/76 127/76 120/84  Pulse: 72 60 (Abnormal) 53 (Abnormal) 49  Resp: 16     Temp: 98 F (36.7 C) (Abnormal) 97.5 F (36.4 C) 97.7 F (36.5 C) 97.6 F (36.4 C)  TempSrc: Oral     SpO2: 97% 97% 99% 100%  Weight:      Height:          Intake/Output from previous day: I/O last 3 completed shifts: In: 5602.8 [P.O.:820; I.V.:4482.6; IV Piggyback:300.2] Out: 4775 [Urine:4575; Blood:200]   Intake/Output this shift: No intake/output data recorded.   LABORATORY DATA: Recent Labs    08/02/18 0458  WBC 14.6*  HGB 12.9*  HCT 37.8*  PLT 205  NA 136  K 5.0  CL 102  CO2 27  BUN 26*  CREATININE 1.10  GLUCOSE 138*  CALCIUM 9.0    Examination: Neurologically intact ABD soft Neurovascular intact Sensation intact distally Intact pulses distally Dorsiflexion/Plantar flexion intact Incision: scant drainage No cellulitis present Compartment soft}  Assessment:   1 Day Post-Op Procedure(s) (LRB): TOTAL KNEE ARTHROPLASTY (Left) ADDITIONAL DIAGNOSIS: Expected Acute Blood Loss Anemia,   Patient's anticipated LOS is less than 2 midnights, meeting these requirements: - Younger than 865 - Lives within 1 hour of care - Has a competent adult at home to recover with post-op recover - NO history of  - Chronic pain requiring opiods  - Diabetes  - Coronary Artery Disease  - Heart failure  - Heart attack  - Stroke  - DVT/VTE  - Cardiac arrhythmia  - Respiratory Failure/COPD  - Renal  failure  - Anemia  - Advanced Liver disease       Plan: PT/OT WBAT, AROM and PROM  DVT Prophylaxis:  SCDx72hrs, ASA 81 mg BID x 2 weeks DISCHARGE PLAN: Home DISCHARGE NEEDS: Walker and 3-in-1 comode seat     Nestor LewandowskyFrank J Cherika Jessie 08/02/2018, 7:38 AM Patient ID: Adam Harvey, male   DOB: [birth year], 57 y.o.   MRN: [id number]

## 2018-08-02 NOTE — Plan of Care (Signed)
Pt is stable. Pain management in progress, effective. Small swelling/ lump to the  L knee noted.Pt denies any pain or discomfort at this time. Ace wrap in place.Ice in place. Md eval on next round. Plane of care reviewed with Pt.

## 2018-08-03 DIAGNOSIS — F329 Major depressive disorder, single episode, unspecified: Secondary | ICD-10-CM | POA: Diagnosis not present

## 2018-08-03 DIAGNOSIS — F429 Obsessive-compulsive disorder, unspecified: Secondary | ICD-10-CM | POA: Diagnosis not present

## 2018-08-03 DIAGNOSIS — F419 Anxiety disorder, unspecified: Secondary | ICD-10-CM | POA: Diagnosis not present

## 2018-08-03 DIAGNOSIS — I10 Essential (primary) hypertension: Secondary | ICD-10-CM | POA: Diagnosis not present

## 2018-08-03 DIAGNOSIS — Z96653 Presence of artificial knee joint, bilateral: Secondary | ICD-10-CM | POA: Diagnosis not present

## 2018-08-03 DIAGNOSIS — E785 Hyperlipidemia, unspecified: Secondary | ICD-10-CM | POA: Diagnosis not present

## 2018-08-03 DIAGNOSIS — Z7982 Long term (current) use of aspirin: Secondary | ICD-10-CM | POA: Diagnosis not present

## 2018-08-03 DIAGNOSIS — F1021 Alcohol dependence, in remission: Secondary | ICD-10-CM | POA: Diagnosis not present

## 2018-08-03 DIAGNOSIS — Z471 Aftercare following joint replacement surgery: Secondary | ICD-10-CM | POA: Diagnosis not present

## 2018-08-03 DIAGNOSIS — M1990 Unspecified osteoarthritis, unspecified site: Secondary | ICD-10-CM | POA: Diagnosis not present

## 2018-08-03 DIAGNOSIS — K219 Gastro-esophageal reflux disease without esophagitis: Secondary | ICD-10-CM | POA: Diagnosis not present

## 2018-08-05 DIAGNOSIS — I10 Essential (primary) hypertension: Secondary | ICD-10-CM | POA: Diagnosis not present

## 2018-08-05 DIAGNOSIS — K219 Gastro-esophageal reflux disease without esophagitis: Secondary | ICD-10-CM | POA: Diagnosis not present

## 2018-08-05 DIAGNOSIS — F429 Obsessive-compulsive disorder, unspecified: Secondary | ICD-10-CM | POA: Diagnosis not present

## 2018-08-05 DIAGNOSIS — F419 Anxiety disorder, unspecified: Secondary | ICD-10-CM | POA: Diagnosis not present

## 2018-08-05 DIAGNOSIS — F329 Major depressive disorder, single episode, unspecified: Secondary | ICD-10-CM | POA: Diagnosis not present

## 2018-08-05 DIAGNOSIS — F1021 Alcohol dependence, in remission: Secondary | ICD-10-CM | POA: Diagnosis not present

## 2018-08-05 DIAGNOSIS — E785 Hyperlipidemia, unspecified: Secondary | ICD-10-CM | POA: Diagnosis not present

## 2018-08-05 DIAGNOSIS — Z471 Aftercare following joint replacement surgery: Secondary | ICD-10-CM | POA: Diagnosis not present

## 2018-08-05 DIAGNOSIS — M1990 Unspecified osteoarthritis, unspecified site: Secondary | ICD-10-CM | POA: Diagnosis not present

## 2018-08-05 DIAGNOSIS — Z7982 Long term (current) use of aspirin: Secondary | ICD-10-CM | POA: Diagnosis not present

## 2018-08-05 DIAGNOSIS — Z96653 Presence of artificial knee joint, bilateral: Secondary | ICD-10-CM | POA: Diagnosis not present

## 2018-08-08 DIAGNOSIS — Z471 Aftercare following joint replacement surgery: Secondary | ICD-10-CM | POA: Diagnosis not present

## 2018-08-08 DIAGNOSIS — F429 Obsessive-compulsive disorder, unspecified: Secondary | ICD-10-CM | POA: Diagnosis not present

## 2018-08-08 DIAGNOSIS — M1990 Unspecified osteoarthritis, unspecified site: Secondary | ICD-10-CM | POA: Diagnosis not present

## 2018-08-08 DIAGNOSIS — I10 Essential (primary) hypertension: Secondary | ICD-10-CM | POA: Diagnosis not present

## 2018-08-08 DIAGNOSIS — K219 Gastro-esophageal reflux disease without esophagitis: Secondary | ICD-10-CM | POA: Diagnosis not present

## 2018-08-08 DIAGNOSIS — Z96653 Presence of artificial knee joint, bilateral: Secondary | ICD-10-CM | POA: Diagnosis not present

## 2018-08-08 DIAGNOSIS — Z7982 Long term (current) use of aspirin: Secondary | ICD-10-CM | POA: Diagnosis not present

## 2018-08-08 DIAGNOSIS — F1021 Alcohol dependence, in remission: Secondary | ICD-10-CM | POA: Diagnosis not present

## 2018-08-08 DIAGNOSIS — F419 Anxiety disorder, unspecified: Secondary | ICD-10-CM | POA: Diagnosis not present

## 2018-08-08 DIAGNOSIS — F329 Major depressive disorder, single episode, unspecified: Secondary | ICD-10-CM | POA: Diagnosis not present

## 2018-08-08 DIAGNOSIS — E785 Hyperlipidemia, unspecified: Secondary | ICD-10-CM | POA: Diagnosis not present

## 2018-08-12 DIAGNOSIS — Z96652 Presence of left artificial knee joint: Secondary | ICD-10-CM | POA: Diagnosis not present

## 2018-08-12 DIAGNOSIS — M25662 Stiffness of left knee, not elsewhere classified: Secondary | ICD-10-CM | POA: Diagnosis not present

## 2018-08-14 ENCOUNTER — Encounter: Payer: Self-pay | Admitting: Emergency Medicine

## 2018-08-14 DIAGNOSIS — F411 Generalized anxiety disorder: Secondary | ICD-10-CM | POA: Insufficient documentation

## 2018-08-14 DIAGNOSIS — F4310 Post-traumatic stress disorder, unspecified: Secondary | ICD-10-CM

## 2018-08-14 DIAGNOSIS — F4010 Social phobia, unspecified: Secondary | ICD-10-CM

## 2018-08-15 DIAGNOSIS — M25662 Stiffness of left knee, not elsewhere classified: Secondary | ICD-10-CM | POA: Diagnosis not present

## 2018-08-15 DIAGNOSIS — Z96652 Presence of left artificial knee joint: Secondary | ICD-10-CM | POA: Diagnosis not present

## 2018-08-16 DIAGNOSIS — M25562 Pain in left knee: Secondary | ICD-10-CM | POA: Diagnosis not present

## 2018-08-16 DIAGNOSIS — Z471 Aftercare following joint replacement surgery: Secondary | ICD-10-CM | POA: Diagnosis not present

## 2018-08-16 DIAGNOSIS — Z96652 Presence of left artificial knee joint: Secondary | ICD-10-CM | POA: Diagnosis not present

## 2018-08-16 DIAGNOSIS — M25462 Effusion, left knee: Secondary | ICD-10-CM | POA: Diagnosis not present

## 2018-08-22 DIAGNOSIS — M25662 Stiffness of left knee, not elsewhere classified: Secondary | ICD-10-CM | POA: Diagnosis not present

## 2018-08-22 DIAGNOSIS — Z96652 Presence of left artificial knee joint: Secondary | ICD-10-CM | POA: Diagnosis not present

## 2018-08-24 ENCOUNTER — Ambulatory Visit: Payer: Self-pay | Admitting: Psychiatry

## 2018-09-07 ENCOUNTER — Ambulatory Visit: Payer: 59 | Admitting: Psychiatry

## 2018-09-07 ENCOUNTER — Encounter: Payer: Self-pay | Admitting: Psychiatry

## 2018-09-07 VITALS — BP 127/76 | HR 76

## 2018-09-07 DIAGNOSIS — F4310 Post-traumatic stress disorder, unspecified: Secondary | ICD-10-CM | POA: Diagnosis not present

## 2018-09-07 DIAGNOSIS — F331 Major depressive disorder, recurrent, moderate: Secondary | ICD-10-CM

## 2018-09-07 DIAGNOSIS — F4010 Social phobia, unspecified: Secondary | ICD-10-CM | POA: Diagnosis not present

## 2018-09-07 DIAGNOSIS — F411 Generalized anxiety disorder: Secondary | ICD-10-CM | POA: Diagnosis not present

## 2018-09-07 MED ORDER — DULOXETINE HCL 30 MG PO CPEP: ORAL_CAPSULE | ORAL | 1 refills | Status: DC

## 2018-09-07 NOTE — Patient Instructions (Signed)
Reduce sertraline to 1-1/2 tablets and add 1 duloxetine 30 mg daily for 1 week. Then reduce sertraline to 1 tablet daily and increase duloxetine teen to 2 capsules daily for 1 week. Then reduce sertraline to 1/2 tablet daily and increase duloxetine to 3 capsules daily for 1 week.   Then stop sertraline

## 2018-09-07 NOTE — Progress Notes (Signed)
Adam Harvey [id number] 01-07-1961 58 y.o.  Subjective:   Patient ID:  Adam Harvey is a 58 y.o. (DOB Nov 17, 1960) male.  Chief Complaint:  Chief Complaint  Patient presents with  . Follow-up    Medication management  . Depression  . Anxiety    HPI  Last visit in July. Adam Harvey presents to the office today for follow-up of several diagnoses.   Couldn't tolerate Buspar dizzy.  But willing to start it again.  A lot of anxiety.  Other knee surgery 5 weeks ago.  No interest or enjoyment.  Finished opiates after 3 weeks were depressing.  Jan is his worse month.  Lonely but don't want to be with anyone but once starts he feels energized.  Bought drum set and used to play.  Excited about that.  Body aches.  Meloxicam for years for knees.  Sleep pretty good.  PT Work.   Review of Systems:  Review of Systems  Constitutional: Positive for fatigue.  Musculoskeletal: Positive for arthralgias.  Neurological: Negative for tremors and weakness.  Psychiatric/Behavioral: Negative for agitation, behavioral problems, confusion, decreased concentration, dysphoric mood, hallucinations, self-injury, sleep disturbance and suicidal ideas. The patient is nervous/anxious. The patient is not hyperactive.     Medications: I have reviewed the patient's current medications.  Current Outpatient Medications  Medication Sig Dispense Refill  . aspirin EC 81 MG tablet Take 1 tablet (81 mg total) by mouth 2 (two) times daily. 60 tablet 0  . celecoxib (CELEBREX) 200 MG capsule Take 1 capsule (200 mg total) by mouth 2 (two) times daily. 60 capsule 2  . lamoTRIgine (LAMICTAL) 150 MG tablet Take 150 mg by mouth at bedtime.    Marland Kitchen lisinopril-hydrochlorothiazide (PRINZIDE,ZESTORETIC) 20-12.5 MG per tablet Take 1 tablet by mouth 2 (two) times daily.     . Multiple Vitamins-Minerals (MULTIVITAMIN WITH MINERALS) tablet Take 1 tablet by mouth daily.    Marland Kitchen omeprazole (PRILOSEC) 20 MG capsule Take 20 mg by mouth 2 (two)  times daily.    . propranolol (INDERAL) 40 MG tablet Take 40 mg by mouth 2 (two) times daily.    . sertraline (ZOLOFT) 100 MG tablet Take 200 mg by mouth daily.    . simvastatin (ZOCOR) 40 MG tablet Take 40 mg by mouth daily.     . DULoxetine (CYMBALTA) 30 MG capsule 1 daily for 1 week, the 2 daily for 1 week, then 3 daily 90 capsule 1  . gabapentin (NEURONTIN) 300 MG capsule Take 1 capsule (300 mg total) by mouth 3 (three) times daily. (Patient not taking: Reported on 09/07/2018) 90 capsule 2  . oxyCODONE-acetaminophen (PERCOCET/ROXICET) 5-325 MG tablet Take 1 tablet by mouth every 4 (four) hours as needed for severe pain. (Patient not taking: Reported on 09/07/2018) 30 tablet 0  . tiZANidine (ZANAFLEX) 2 MG tablet Take 1 tablet (2 mg total) by mouth every 6 (six) hours as needed. (Patient not taking: Reported on 09/07/2018) 60 tablet 0   No current facility-administered medications for this visit.     Medication Side Effects: None  Allergies: No Known Allergies  Past Medical History:  Diagnosis Date  . Alcoholism (HCC)    sober since 01-2011   . Anxiety   . Arthritis   . Complication of anesthesia    unsuccessful spinal with right knee replacement, used general anesthesia  . Depression   . Diverticulosis 2012  . GERD (gastroesophageal reflux disease)   . Hemorrhoids   . Hyperlipidemia   . Hypertension   . Internal  hemorrhoids 2012  . Obsessive compulsive disorder   . Osteoarthritis   . Rotator cuff tear, left   . Tear of right rotator cuff 08/03/2014  . Wears contact lenses     Family History  Problem Relation Age of Onset  . Colon cancer Neg Hx     Social History   Socioeconomic History  . Marital status: Divorced    Spouse name: Not on file  . Number of children: 3  . Years of education: Not on file  . Highest education level: Not on file  Occupational History  . Occupation: Tree surgeonrogram Director  Social Needs  . Financial resource strain: Not on file  . Food  insecurity:    Worry: Not on file    Inability: Not on file  . Transportation needs:    Medical: Not on file    Non-medical: Not on file  Tobacco Use  . Smoking status: Former Smoker    Types: E-cigarettes    Last attempt to quit: 08/01/2010    Years since quitting: 8.1  . Smokeless tobacco: Never Used  Substance and Sexual Activity  . Alcohol use: No    Comment: not since 2012  . Drug use: No  . Sexual activity: Not on file    Comment: uses e-cig  Lifestyle  . Physical activity:    Days per week: Not on file    Minutes per session: Not on file  . Stress: Not on file  Relationships  . Social connections:    Talks on phone: Not on file    Gets together: Not on file    Attends religious service: Not on file    Active member of club or organization: Not on file    Attends meetings of clubs or organizations: Not on file    Relationship status: Not on file  . Intimate partner violence:    Fear of current or ex partner: Not on file    Emotionally abused: Not on file    Physically abused: Not on file    Forced sexual activity: Not on file  Other Topics Concern  . Not on file  Social History Narrative  . Not on file    Past Medical History, Surgical history, Social history, and Family history were reviewed and updated as appropriate.   Please see review of systems for further details on the patient's review from today.   Objective:   Physical Exam:  BP 127/76 (BP Location: Left Arm)   Pulse 76   Physical Exam Constitutional:      General: He is not in acute distress.    Appearance: He is well-developed.  Musculoskeletal:        General: No deformity.  Neurological:     Mental Status: He is alert and oriented to person, place, and time.     Motor: No tremor.     Coordination: Coordination normal.     Gait: Gait abnormal.  Psychiatric:        Attention and Perception: Attention and perception normal.        Mood and Affect: Mood is anxious and depressed. Affect  is not labile, blunt, angry or inappropriate.        Speech: Speech normal.        Behavior: Behavior normal.        Thought Content: Thought content normal. Thought content does not include homicidal or suicidal ideation. Thought content does not include homicidal or suicidal plan.  Cognition and Memory: Cognition normal.        Judgment: Judgment normal.     Comments: Insight intact. No auditory or visual hallucinations. No delusions.      Lab Review:     Component Value Date/Time   NA 136 08/02/2018 0458   K 5.0 08/02/2018 0458   CL 102 08/02/2018 0458   CO2 27 08/02/2018 0458   GLUCOSE 138 (H) 08/02/2018 0458   BUN 26 (H) 08/02/2018 0458   CREATININE 1.10 08/02/2018 0458   CALCIUM 9.0 08/02/2018 0458   PROT 7.6 08/12/2017 1445   ALBUMIN 4.3 08/12/2017 1445   AST 29 08/12/2017 1445   ALT 26 08/12/2017 1445   ALKPHOS 60 08/12/2017 1445   BILITOT 1.0 08/12/2017 1445   GFRNONAA >60 08/02/2018 0458   GFRAA >60 08/02/2018 0458       Component Value Date/Time   WBC 14.6 (H) 08/02/2018 0458   RBC 4.06 (L) 08/02/2018 0458   HGB 12.9 (L) 08/02/2018 0458   HCT 37.8 (L) 08/02/2018 0458   PLT 205 08/02/2018 0458   MCV 93.1 08/02/2018 0458   MCH 31.8 08/02/2018 0458   MCHC 34.1 08/02/2018 0458   RDW 12.9 08/02/2018 0458   LYMPHSABS 1.4 07/26/2018 0954   MONOABS 0.6 07/26/2018 0954   EOSABS 0.2 07/26/2018 0954   BASOSABS 0.0 07/26/2018 0954    No results found for: POCLITH, LITHIUM   No results found for: PHENYTOIN, PHENOBARB, VALPROATE, CBMZ   .res Assessment: Plan:    GAD (generalized anxiety disorder)  Major depressive disorder, recurrent episode, moderate (HCC)  PTSD (post-traumatic stress disorder)  Social anxiety disorder   Change to duloxetine due to potential pain benefit.  Hope for better mood and anxiety benefit also. Will use moderate dosage of 90 mg daily.  Disc SE and cross taper.  Option retry lower dose of Buspirone.  Fu 2 mos  Meredith Staggersarey  Cottle, MD, DFAPA     Please see After Visit Summary for patient specific instructions.  Future Appointments  Date Time Provider Department Center  11/16/2018  1:15 PM Cottle, Steva Readyarey G Jr., MD [facility] None    No orders of the defined types were placed in this encounter.     -------------------------------

## 2018-10-02 ENCOUNTER — Emergency Department (HOSPITAL_COMMUNITY)
Admission: EM | Admit: 2018-10-02 | Discharge: 2018-10-02 | Disposition: A | Discharge status: Home / Self Care | Payer: 59 | Attending: Emergency Medicine | Admitting: Emergency Medicine

## 2018-10-02 ENCOUNTER — Encounter (HOSPITAL_COMMUNITY): Payer: Self-pay | Admitting: Nurse Practitioner

## 2018-10-02 DIAGNOSIS — F329 Major depressive disorder, single episode, unspecified: Secondary | ICD-10-CM | POA: Insufficient documentation

## 2018-10-02 DIAGNOSIS — F419 Anxiety disorder, unspecified: Secondary | ICD-10-CM | POA: Diagnosis not present

## 2018-10-02 DIAGNOSIS — X58XXXA Exposure to other specified factors, initial encounter: Secondary | ICD-10-CM | POA: Diagnosis not present

## 2018-10-02 DIAGNOSIS — Y998 Other external cause status: Secondary | ICD-10-CM | POA: Insufficient documentation

## 2018-10-02 DIAGNOSIS — Y929 Unspecified place or not applicable: Secondary | ICD-10-CM | POA: Insufficient documentation

## 2018-10-02 DIAGNOSIS — Z96653 Presence of artificial knee joint, bilateral: Secondary | ICD-10-CM | POA: Insufficient documentation

## 2018-10-02 DIAGNOSIS — Z87891 Personal history of nicotine dependence: Secondary | ICD-10-CM | POA: Insufficient documentation

## 2018-10-02 DIAGNOSIS — Z7982 Long term (current) use of aspirin: Secondary | ICD-10-CM | POA: Diagnosis not present

## 2018-10-02 DIAGNOSIS — Y9389 Activity, other specified: Secondary | ICD-10-CM | POA: Diagnosis not present

## 2018-10-02 DIAGNOSIS — S01532A Puncture wound without foreign body of oral cavity, initial encounter: Secondary | ICD-10-CM | POA: Diagnosis not present

## 2018-10-02 DIAGNOSIS — I10 Essential (primary) hypertension: Secondary | ICD-10-CM | POA: Diagnosis not present

## 2018-10-02 DIAGNOSIS — S00502A Unspecified superficial injury of oral cavity, initial encounter: Secondary | ICD-10-CM | POA: Diagnosis present

## 2018-10-02 DIAGNOSIS — Z79899 Other long term (current) drug therapy: Secondary | ICD-10-CM | POA: Diagnosis not present

## 2018-10-02 NOTE — ED Triage Notes (Signed)
Pt state his tongue has been intermittently bleeding and would like to it cauterized.

## 2018-10-02 NOTE — Discharge Instructions (Addendum)
Hold pressure if any further bleeding

## 2018-10-03 NOTE — ED Provider Notes (Signed)
[facility]-EMERGENCY DEPT Provider Note   CSN: [id number] Arrival date & time: 10/02/18  1617    History   Chief Complaint Chief Complaint  Patient presents with  . Tongue Bleed    HPI Adam Harvey is a 58 y.o. male.     The history is provided by the patient. No language interpreter was used.  Mouth Injury  This is a new problem. The current episode started 6 to 12 hours ago. The problem occurs constantly. The problem has been gradually worsening. Nothing aggravates the symptoms. Nothing relieves the symptoms. He has tried nothing for the symptoms.  Pt reports he bit a chip and his tongue started bleeding.  Pt held pressure and bleeding stopped.  Pt reports bleeding started again and stopped with pressure. Pt reports he has a sore area on his tongue.   Past Medical History:  Diagnosis Date  . Alcoholism (HCC)    sober since 01-2011   . Anxiety   . Arthritis   . Complication of anesthesia    unsuccessful spinal with right knee replacement, used general anesthesia  . Depression   . Diverticulosis 2012  . GERD (gastroesophageal reflux disease)   . Hemorrhoids   . Hyperlipidemia   . Hypertension   . Internal hemorrhoids 2012  . Obsessive compulsive disorder   . Osteoarthritis   . Rotator cuff tear, left   . Tear of right rotator cuff 08/03/2014  . Wears contact lenses     Patient Active Problem List   Diagnosis Date Noted  . GAD (generalized anxiety disorder) 08/14/2018  . PTSD (post-traumatic stress disorder) 08/14/2018  . Social anxiety disorder 08/14/2018  . S/P total knee arthroplasty, left 08/01/2018  . Degenerative arthritis of left knee 07/29/2018  . Primary localized osteoarthritis of right knee 08/20/2017  . Tear of right rotator cuff 08/03/2014    Past Surgical History:  Procedure Laterality Date  . ABCESS DRAINAGE  2012   nasal cyst  . ACHILLES TENDON REPAIR  2008   right  . ARTHOSCOPIC ROTAOR CUFF REPAIR Right 08/03/2014   Procedure: ARTHROSCOPIC ROTATOR CUFF REPAIR;  Surgeon: Eulas Post, MD;  Location: [facility];  Service: Orthopedics;  Laterality: Right;  . COLONOSCOPY  2012  . INGUINAL HERNIA REPAIR     right as infant  . KNEE ARTHROSCOPY Left    x4  . KNEE SURGERY     x3-right  . ROTATOR CUFF REPAIR  2011   left  . TONSILLECTOMY    . TOTAL KNEE ARTHROPLASTY Right 12/2017   debride scar tissue  . TOTAL KNEE ARTHROPLASTY Right 08/20/2017   Procedure: TOTAL KNEE ARTHROPLASTY;  Surgeon: Frederico Hamman, MD;  Location: [facility];  Service: Orthopedics;  Laterality: Right;  . TOTAL KNEE ARTHROPLASTY Left 08/01/2018   Procedure: TOTAL KNEE ARTHROPLASTY;  Surgeon: Gean Birchwood, MD;  Location: [facility];  Service: Orthopedics;  Laterality: Left;  Adductor Block  . TYMPANOSTOMY TUBE PLACEMENT     as child, several times        Home Medications    Prior to Admission medications   Medication Sig Start Date End Date Taking? Authorizing Provider  aspirin EC 81 MG tablet Take 1 tablet (81 mg total) by mouth 2 (two) times daily. 08/01/18   Allena Katz, PA-C  celecoxib (CELEBREX) 200 MG capsule Take 1 capsule (200 mg total) by mouth 2 (two) times daily. 08/01/18 08/01/19  Allena Katz, PA-C  DULoxetine (CYMBALTA) 30 MG capsule 1 daily for  1 week, the 2 daily for 1 week, then 3 daily 09/07/18   Cottle, Steva Readyarey G Jr., MD  gabapentin (NEURONTIN) 300 MG capsule Take 1 capsule (300 mg total) by mouth 3 (three) times daily. Patient not taking: Reported on 09/07/2018 08/01/18 08/01/19  Allena KatzPhillips, Eric K, PA-C  lamoTRIgine (LAMICTAL) 150 MG tablet Take 150 mg by mouth at bedtime.    [provider]  lisinopril-hydrochlorothiazide (PRINZIDE,ZESTORETIC) 20-12.5 MG per tablet Take 1 tablet by mouth 2 (two) times daily.     [provider]  Multiple Vitamins-Minerals (MULTIVITAMIN WITH MINERALS) tablet Take 1 tablet by mouth daily.    [provider]  omeprazole (PRILOSEC) 20 MG  capsule Take 20 mg by mouth 2 (two) times daily.    [provider]  oxyCODONE-acetaminophen (PERCOCET/ROXICET) 5-325 MG tablet Take 1 tablet by mouth every 4 (four) hours as needed for severe pain. Patient not taking: Reported on 09/07/2018 08/01/18   Allena KatzPhillips, Eric K, PA-C  propranolol (INDERAL) 40 MG tablet Take 40 mg by mouth 2 (two) times daily.    [provider]  sertraline (ZOLOFT) 100 MG tablet Take 200 mg by mouth daily.    [provider]  simvastatin (ZOCOR) 40 MG tablet Take 40 mg by mouth daily.     [provider]  tiZANidine (ZANAFLEX) 2 MG tablet Take 1 tablet (2 mg total) by mouth every 6 (six) hours as needed. Patient not taking: Reported on 09/07/2018 08/01/18   Allena KatzPhillips, Eric K, PA-C    Family History Family History  Problem Relation Age of Onset  . Colon cancer Neg Hx     Social History Social History   Tobacco Use  . Smoking status: Former Smoker    Types: E-cigarettes    Last attempt to quit: 08/01/2010    Years since quitting: 8.1  . Smokeless tobacco: Never Used  Substance Use Topics  . Alcohol use: No    Comment: not since 2012  . Drug use: No     Allergies   Patient has no known allergies.   Review of Systems Review of Systems  All other systems reviewed and are negative.    Physical Exam Updated Vital Signs BP 126/90 (BP Location: Left Arm)   Pulse 79   Temp 98.5 F (36.9 C) (Oral)   Resp 18   SpO2 99%   Physical Exam Vitals signs and nursing note reviewed.  Constitutional:      Appearance: He is well-developed.  HENT:     Head: Normocephalic.     Nose: Nose normal.     Mouth/Throat:     Mouth: Mucous membranes are moist.     Pharynx: Oropharynx is clear.     Comments: Small puncture tongue.  Slightly raised papillae  Neck:     Musculoskeletal: Normal range of motion.  Pulmonary:     Effort: Pulmonary effort is normal.  Abdominal:     General: There is no distension.  Musculoskeletal:  Normal range of motion.  Neurological:     Mental Status: He is alert and oriented to person, place, and time.      ED Treatments / Results  Labs (all labs ordered are listed, but only abnormal results are displayed) Labs Reviewed - No data to display  EKG None  Radiology No results found.  Procedures Procedures (including critical care time)  Medications Ordered in ED Medications - No data to display   Initial Impression / Assessment and Plan / ED Course  I have  reviewed the triage vital signs and the nursing notes.  Pertinent labs & imaging results that were available during my care of the patient were reviewed by me and considered in my medical decision making (see chart for details).        MDM  Pt advised to hold pressure.  Recheck with primary if symptoms persist   Final Clinical Impressions(s) / ED Diagnoses   Final diagnoses:  Puncture wound of tongue, initial encounter    ED Discharge Orders    None    An After Visit Summary was printed and given to the patient.    Osie Cheeks 10/03/18 2354    Terrilee Files, MD 10/04/18 [phone]

## 2018-10-04 ENCOUNTER — Other Ambulatory Visit: Payer: Self-pay

## 2018-10-04 MED ORDER — LAMOTRIGINE 150 MG PO TABS: 150.0000 mg | ORAL_TABLET | Freq: Every day | ORAL | 1 refills | Status: DC

## 2018-10-04 NOTE — Progress Notes (Signed)
[facility] [address] faxed refill request for lamotrigine 150 mg Refill submitted, next ov 11/16/2018

## 2018-10-10 ENCOUNTER — Telehealth: Payer: Self-pay | Admitting: Psychiatry

## 2018-10-10 NOTE — Telephone Encounter (Signed)
Pt verbalized understanding and instructed to call back with any problems or concerns.

## 2018-10-10 NOTE — Telephone Encounter (Signed)
Per his request about coming off duloxetine.  The proper way to minimize withdrawal effects is to reduce from 90 mg to 60 mg and wait 3 to 4 weeks, then reduce to 30 mg and wait 2 to 4 weeks and then stop the medication.  It is quite possible that he could have an increase in depression or anxiety from doing this and if he does he should get in touch with [place].  If he has any bothersome withdrawal side effects such as dizziness nausea or headaches then let us know.  They are ways to deal with withdrawal symptoms.  I would discourage him from making decisions based on what he reads on fourms on the Internet.  I can give him much more accurate information that he can get from those forums.  Meredith Staggers, MD, DFAPA

## 2018-10-10 NOTE — Telephone Encounter (Signed)
Patient would like someone to call him concerning his Cymbalta. Stated he would like to discontinue usage as soon as possible.

## 2018-10-10 NOTE — Telephone Encounter (Signed)
Left voice mail to call back 

## 2018-11-16 ENCOUNTER — Encounter: Payer: Self-pay | Admitting: Psychiatry

## 2018-11-16 ENCOUNTER — Ambulatory Visit (INDEPENDENT_AMBULATORY_CARE_PROVIDER_SITE_OTHER): Payer: 59 | Admitting: Psychiatry

## 2018-11-16 DIAGNOSIS — F4010 Social phobia, unspecified: Secondary | ICD-10-CM | POA: Diagnosis not present

## 2018-11-16 DIAGNOSIS — F4310 Post-traumatic stress disorder, unspecified: Secondary | ICD-10-CM

## 2018-11-16 DIAGNOSIS — F411 Generalized anxiety disorder: Secondary | ICD-10-CM | POA: Diagnosis not present

## 2018-11-16 DIAGNOSIS — F331 Major depressive disorder, recurrent, moderate: Secondary | ICD-10-CM

## 2018-11-16 NOTE — Progress Notes (Signed)
Adam Harvey [id number] [birth year] 58 y.o.  Subjective:   Patient ID:  Adam Harvey is a 58 y.o. (DOB 1961/06/30) male.  Chief Complaint:  Chief Complaint  Patient presents with  . Follow-up    med change  . Anxiety    HPI  Last visit January Adam Harvey presents to the office today for follow-up of several diagnoses.   Changed from sertraline to duloxetine. Then changed his mind.  Feared withdrawal coming off it from reading on internet.  Stopped all antidepressant completely last Friday.  Feels good.  Wants a trial off antidepressant.bc wondering is he was somewhat flat from it.  Gabapentin for pain helps.  Doing AA by internet.  Patient reports stable mood and denies depressed or irritable moods.  Patient denies any recent difficulty with anxiety.  Patient denies difficulty with sleep initiation or maintenance. Denies appetite disturbance.  Patient reports that energy and motivation have been good.  Patient denies any difficulty with concentration.  Patient denies any suicidal ideation.   Past Psychiatric Medication Trials:  Brief duloxetine, sertraline 200,, buspirone dizzy  Review of Systems:  Review of Systems  Constitutional: Positive for fatigue.  Musculoskeletal: Positive for arthralgias.  Neurological: Negative for tremors and weakness.  Psychiatric/Behavioral: Negative for agitation, behavioral problems, confusion, decreased concentration, dysphoric mood, hallucinations, self-injury, sleep disturbance and suicidal ideas. The patient is not nervous/anxious and is not hyperactive.     Medications: I have reviewed the patient's current medications.  Current Outpatient Medications  Medication Sig Dispense Refill  . aspirin EC 81 MG tablet Take 1 tablet (81 mg total) by mouth 2 (two) times daily. 60 tablet 0  . celecoxib (CELEBREX) 200 MG capsule Take 1 capsule (200 mg total) by mouth 2 (two) times daily. 60 capsule 2  . gabapentin (NEURONTIN) 300 MG capsule Take 1  capsule (300 mg total) by mouth 3 (three) times daily. (Patient taking differently: Take 300 mg by mouth 2 (two) times daily. ) 90 capsule 2  . lamoTRIgine (LAMICTAL) 150 MG tablet Take 1 tablet (150 mg total) by mouth at bedtime. 90 tablet 1  . lisinopril-hydrochlorothiazide (PRINZIDE,ZESTORETIC) 20-12.5 MG per tablet Take 1 tablet by mouth 2 (two) times daily.     . Multiple Vitamins-Minerals (MULTIVITAMIN WITH MINERALS) tablet Take 1 tablet by mouth daily.    Marland Kitchen omeprazole (PRILOSEC) 20 MG capsule Take 20 mg by mouth 2 (two) times daily.    . propranolol (INDERAL) 40 MG tablet Take 40 mg by mouth 2 (two) times daily.    . simvastatin (ZOCOR) 40 MG tablet Take 40 mg by mouth daily.     Marland Kitchen oxyCODONE-acetaminophen (PERCOCET/ROXICET) 5-325 MG tablet Take 1 tablet by mouth every 4 (four) hours as needed for severe pain. (Patient not taking: Reported on 09/07/2018) 30 tablet 0  . tiZANidine (ZANAFLEX) 2 MG tablet Take 1 tablet (2 mg total) by mouth every 6 (six) hours as needed. (Patient not taking: Reported on 09/07/2018) 60 tablet 0   No current facility-administered medications for this visit.     Medication Side Effects: None  Allergies: No Known Allergies  Past Medical History:  Diagnosis Date  . Alcoholism (HCC)    sober since 01-2011   . Anxiety   . Arthritis   . Complication of anesthesia    unsuccessful spinal with right knee replacement, used general anesthesia  . Depression   . Diverticulosis 2012  . GERD (gastroesophageal reflux disease)   . Hemorrhoids   . Hyperlipidemia   . Hypertension   .  Internal hemorrhoids 2012  . Obsessive compulsive disorder   . Osteoarthritis   . Rotator cuff tear, left   . Tear of right rotator cuff 08/03/2014  . Wears contact lenses     Family History  Problem Relation Age of Onset  . Colon cancer Neg Hx     Social History   Socioeconomic History  . Marital status: Divorced    Spouse name: Not on file  . Number of children: 3  . Years  of education: Not on file  . Highest education level: Not on file  Occupational History  . Occupation: Tree surgeon  Social Needs  . Financial resource strain: Not on file  . Food insecurity:    Worry: Not on file    Inability: Not on file  . Transportation needs:    Medical: Not on file    Non-medical: Not on file  Tobacco Use  . Smoking status: Former Smoker    Types: E-cigarettes    Last attempt to quit: 08/01/2010    Years since quitting: 8.2  . Smokeless tobacco: Never Used  Substance and Sexual Activity  . Alcohol use: No    Comment: not since 2012  . Drug use: No  . Sexual activity: Not on file    Comment: uses e-cig  Lifestyle  . Physical activity:    Days per week: Not on file    Minutes per session: Not on file  . Stress: Not on file  Relationships  . Social connections:    Talks on phone: Not on file    Gets together: Not on file    Attends religious service: Not on file    Active member of club or organization: Not on file    Attends meetings of clubs or organizations: Not on file    Relationship status: Not on file  . Intimate partner violence:    Fear of current or ex partner: Not on file    Emotionally abused: Not on file    Physically abused: Not on file    Forced sexual activity: Not on file  Other Topics Concern  . Not on file  Social History Narrative  . Not on file    Past Medical History, Surgical history, Social history, and Family history were reviewed and updated as appropriate.   Please see review of systems for further details on the patient's review from today.   Objective:   Physical Exam:  There were no vitals taken for this visit.  Physical Exam Neurological:     Mental Status: He is alert and oriented to person, place, and time.     Cranial Nerves: No dysarthria.  Psychiatric:        Attention and Perception: Attention normal.        Mood and Affect: Mood normal. Mood is not anxious or depressed.        Speech: Speech  normal.        Behavior: Behavior is cooperative.        Thought Content: Thought content normal. Thought content is not paranoid or delusional. Thought content does not include homicidal or suicidal ideation. Thought content does not include homicidal or suicidal plan.        Cognition and Memory: Cognition and memory normal.        Judgment: Judgment normal.     Comments: Talkative.     Lab Review:     Component Value Date/Time   NA 136 08/02/2018 0458   K 5.0 08/02/2018  0458   CL 102 08/02/2018 0458   CO2 27 08/02/2018 0458   GLUCOSE 138 (H) 08/02/2018 0458   BUN 26 (H) 08/02/2018 0458   CREATININE 1.10 08/02/2018 0458   CALCIUM 9.0 08/02/2018 0458   PROT 7.6 08/12/2017 1445   ALBUMIN 4.3 08/12/2017 1445   AST 29 08/12/2017 1445   ALT 26 08/12/2017 1445   ALKPHOS 60 08/12/2017 1445   BILITOT 1.0 08/12/2017 1445   GFRNONAA >60 08/02/2018 0458   GFRAA >60 08/02/2018 0458       Component Value Date/Time   WBC 14.6 (H) 08/02/2018 0458   RBC 4.06 (L) 08/02/2018 0458   HGB 12.9 (L) 08/02/2018 0458   HCT 37.8 (L) 08/02/2018 0458   PLT 205 08/02/2018 0458   MCV 93.1 08/02/2018 0458   MCH 31.8 08/02/2018 0458   MCHC 34.1 08/02/2018 0458   RDW 12.9 08/02/2018 0458   LYMPHSABS 1.4 07/26/2018 0954   MONOABS 0.6 07/26/2018 0954   EOSABS 0.2 07/26/2018 0954   BASOSABS 0.0 07/26/2018 0954    No results found for: POCLITH, LITHIUM   No results found for: PHENYTOIN, PHENOBARB, VALPROATE, CBMZ   .res Assessment: Plan:    GAD (generalized anxiety disorder)  Major depressive disorder, recurrent episode, moderate (HCC)  PTSD (post-traumatic stress disorder)  Social anxiety disorder   Patient states he has been on antidepressants very long time and wondered what his emotional state would be like off the medication.  This was partly influenced by online patient for him to described problems with withdrawal coming off of SSRIs.  He successfully came off antidepressant without  withdrawal symptoms.  So far he has had no relapse but is only been a few days.  He is aware of the risk of recurrence of depression and/or anxiety over the next 2 to 3 months especially.  He wants to do this experiment.  He also would like to consider weaning off lamotrigine as well at some point in the future but is willing to give himself a good 6 months of stability before considering that option.  No new meds prescribed today  He will remain on lamotrigine 150 mg daily  Option retry lower dose of Buspirone.  Fu 3 mos.  He will call if he relapses.  I connected with patient by a video enabled telemedicine application or telephone, with their informed consent, and verified patient privacy and that I am speaking with the correct person using two identifiers.  I was located at office and patient at home.    Meredith Staggers, MD, DFAPA     Please see After Visit Summary for patient specific instructions.  No future appointments.  No orders of the defined types were placed in this encounter.     -------------------------------

## 2019-01-16 ENCOUNTER — Telehealth: Payer: Self-pay | Admitting: Psychiatry

## 2019-01-16 NOTE — Telephone Encounter (Signed)
Schedule him for 10 am Wed

## 2019-01-16 NOTE — Telephone Encounter (Signed)
Patient called and wants to go back on his cymbalta.  Please give him a call at [phone]

## 2019-01-17 NOTE — Telephone Encounter (Signed)
Schedule him ASAP. Cancellation list

## 2019-01-18 ENCOUNTER — Encounter: Payer: Self-pay | Admitting: Psychiatry

## 2019-01-18 ENCOUNTER — Other Ambulatory Visit: Payer: Self-pay

## 2019-01-18 ENCOUNTER — Ambulatory Visit (INDEPENDENT_AMBULATORY_CARE_PROVIDER_SITE_OTHER): Payer: 59 | Admitting: Psychiatry

## 2019-01-18 DIAGNOSIS — F4310 Post-traumatic stress disorder, unspecified: Secondary | ICD-10-CM | POA: Diagnosis not present

## 2019-01-18 DIAGNOSIS — F4010 Social phobia, unspecified: Secondary | ICD-10-CM | POA: Diagnosis not present

## 2019-01-18 DIAGNOSIS — F411 Generalized anxiety disorder: Secondary | ICD-10-CM

## 2019-01-18 DIAGNOSIS — F331 Major depressive disorder, recurrent, moderate: Secondary | ICD-10-CM

## 2019-01-18 DIAGNOSIS — G8929 Other chronic pain: Secondary | ICD-10-CM

## 2019-01-18 MED ORDER — DULOXETINE HCL 60 MG PO CPEP: 60.0000 mg | ORAL_CAPSULE | Freq: Every day | ORAL | 1 refills | Status: DC

## 2019-01-18 NOTE — Progress Notes (Signed)
Adam Harvey [id number] 05/28/1961 58 y.o.   Virtual Visit via Telephone Note  I connected with pt by telephone and verified that I am speaking with the correct person using two identifiers.   I discussed the limitations, risks, security and privacy concerns of performing an evaluation and management service by telephone and the availability of in person appointments. I also discussed with the patient that there may be a patient responsible charge related to this service. The patient expressed understanding and agreed to proceed.  I discussed the assessment and treatment plan with the patient. The patient was provided an opportunity to ask questions and all were answered. The patient agreed with the plan and demonstrated an understanding of the instructions.   The patient was advised to call back or seek an in-person evaluation if the symptoms worsen or if the condition fails to improve as anticipated.  I provided 25 minutes of non-face-to-face time during this encounter. The call started at 1025 and ended at 1050. The patient was located at home and the provider was located office.   Subjective:   Patient ID:  Adam Harvey is a 58 y.o. (DOB 1961-07-24) male.  Chief Complaint:  Chief Complaint  Patient presents with  . Follow-up    Medication Management  . Anxiety    Medication Management  . Depression    Anxiety  Patient reports no confusion, decreased concentration, nervous/anxious behavior or suicidal ideas.     Adam Harvey presents to the office today for follow-up of several diagnoses and wanted an earlier appointment because he wanted to restart duloxetine.  Last seen November 16, 2018.  He had wanted a trial off of antidepressants in part because of reading on the Internet about withdrawal symptoms.  He has been off antidepressants for 2 months.  Embarrassed bc now needs meds.  Feeling really good for awhile.  In the last couple weeks with more anxiety.  More about work.   Lost a lot of motivation.  Some depression.  Thoughts are more scattered.  Over the last week harder to fall asleep. More restless.  Good appetite.  For years has had thoughts of if got terminal disease it would be easier and they are worse.  Hopelessness.  Gabapentin for pain helps.  Doing AA by internet.  Past Psychiatric Medication Trials:  Brief duloxetine, sertraline 200, buspirone dizzy, gabapentin  Review of Systems:  Review of Systems  Constitutional: Positive for fatigue.  Musculoskeletal: Positive for arthralgias.  Neurological: Negative for tremors and weakness.  Psychiatric/Behavioral: Negative for agitation, behavioral problems, confusion, decreased concentration, dysphoric mood, hallucinations, self-injury, sleep disturbance and suicidal ideas. The patient is not nervous/anxious and is not hyperactive.     Medications: I have reviewed the patient's current medications.  Current Outpatient Medications  Medication Sig Dispense Refill  . aspirin EC 81 MG tablet Take 1 tablet (81 mg total) by mouth 2 (two) times daily. (Patient taking differently: Take 81 mg by mouth daily. ) 60 tablet 0  . gabapentin (NEURONTIN) 300 MG capsule Take 1 capsule (300 mg total) by mouth 3 (three) times daily. (Patient taking differently: Take 300 mg by mouth 2 (two) times daily. ) 90 capsule 2  . lamoTRIgine (LAMICTAL) 150 MG tablet Take 1 tablet (150 mg total) by mouth at bedtime. 90 tablet 1  . lisinopril-hydrochlorothiazide (PRINZIDE,ZESTORETIC) 20-12.5 MG per tablet Take 1 tablet by mouth 2 (two) times daily.     . Multiple Vitamins-Minerals (MULTIVITAMIN WITH MINERALS) tablet Take 1 tablet by mouth daily.    Marland Kitchen  omeprazole (PRILOSEC) 20 MG capsule Take 20 mg by mouth 2 (two) times daily.    . propranolol (INDERAL) 40 MG tablet Take 40 mg by mouth 2 (two) times daily.    . simvastatin (ZOCOR) 40 MG tablet Take 40 mg by mouth daily.      No current facility-administered medications for this visit.      Medication Side Effects: None  Allergies: No Known Allergies  Past Medical History:  Diagnosis Date  . Alcoholism (HCC)    sober since 01-2011   . Anxiety   . Arthritis   . Complication of anesthesia    unsuccessful spinal with right knee replacement, used general anesthesia  . Depression   . Diverticulosis 2012  . GERD (gastroesophageal reflux disease)   . Hemorrhoids   . Hyperlipidemia   . Hypertension   . Internal hemorrhoids 2012  . Obsessive compulsive disorder   . Osteoarthritis   . Rotator cuff tear, left   . Tear of right rotator cuff 08/03/2014  . Wears contact lenses     Family History  Problem Relation Age of Onset  . Colon cancer Neg Hx     Social History   Socioeconomic History  . Marital status: Divorced    Spouse name: Not on file  . Number of children: 3  . Years of education: Not on file  . Highest education level: Not on file  Occupational History  . Occupation: Tree surgeonrogram Director  Social Needs  . Financial resource strain: Not on file  . Food insecurity:    Worry: Not on file    Inability: Not on file  . Transportation needs:    Medical: Not on file    Non-medical: Not on file  Tobacco Use  . Smoking status: Former Smoker    Types: E-cigarettes    Last attempt to quit: 08/01/2010    Years since quitting: 8.4  . Smokeless tobacco: Never Used  Substance and Sexual Activity  . Alcohol use: No    Comment: not since 2012  . Drug use: No  . Sexual activity: Not on file    Comment: uses e-cig  Lifestyle  . Physical activity:    Days per week: Not on file    Minutes per session: Not on file  . Stress: Not on file  Relationships  . Social connections:    Talks on phone: Not on file    Gets together: Not on file    Attends religious service: Not on file    Active member of club or organization: Not on file    Attends meetings of clubs or organizations: Not on file    Relationship status: Not on file  . Intimate partner violence:     Fear of current or ex partner: Not on file    Emotionally abused: Not on file    Physically abused: Not on file    Forced sexual activity: Not on file  Other Topics Concern  . Not on file  Social History Narrative  . Not on file    Past Medical History, Surgical history, Social history, and Family history were reviewed and updated as appropriate.   Please see review of systems for further details on the patient's review from today.   Objective:   Physical Exam:  There were no vitals taken for this visit.  Physical Exam Neurological:     Mental Status: He is alert and oriented to person, place, and time.     Cranial Nerves: No dysarthria.  Psychiatric:        Attention and Perception: Attention normal. He does not perceive auditory hallucinations.        Mood and Affect: Mood is anxious and depressed.        Speech: Speech normal.        Behavior: Behavior is cooperative.        Thought Content: Thought content normal. Thought content is not paranoid or delusional. Thought content does not include homicidal or suicidal ideation. Thought content does not include homicidal or suicidal plan.        Cognition and Memory: Cognition and memory normal.        Judgment: Judgment normal.     Comments: Talkative.     Lab Review:     Component Value Date/Time   NA 136 08/02/2018 0458   K 5.0 08/02/2018 0458   CL 102 08/02/2018 0458   CO2 27 08/02/2018 0458   GLUCOSE 138 (H) 08/02/2018 0458   BUN 26 (H) 08/02/2018 0458   CREATININE 1.10 08/02/2018 0458   CALCIUM 9.0 08/02/2018 0458   PROT 7.6 08/12/2017 1445   ALBUMIN 4.3 08/12/2017 1445   AST 29 08/12/2017 1445   ALT 26 08/12/2017 1445   ALKPHOS 60 08/12/2017 1445   BILITOT 1.0 08/12/2017 1445   GFRNONAA >60 08/02/2018 0458   GFRAA >60 08/02/2018 0458       Component Value Date/Time   WBC 14.6 (H) 08/02/2018 0458   RBC 4.06 (L) 08/02/2018 0458   HGB 12.9 (L) 08/02/2018 0458   HCT 37.8 (L) 08/02/2018 0458   PLT  205 08/02/2018 0458   MCV 93.1 08/02/2018 0458   MCH 31.8 08/02/2018 0458   MCHC 34.1 08/02/2018 0458   RDW 12.9 08/02/2018 0458   LYMPHSABS 1.4 07/26/2018 0954   MONOABS 0.6 07/26/2018 0954   EOSABS 0.2 07/26/2018 0954   BASOSABS 0.0 07/26/2018 0954    No results found for: POCLITH, LITHIUM   No results found for: PHENYTOIN, PHENOBARB, VALPROATE, CBMZ   .res Assessment: Plan:    Major depressive disorder, recurrent episode, moderate (HCC)  GAD (generalized anxiety disorder)  PTSD (post-traumatic stress disorder)  Social anxiety disorder   Last visit Patient stated he has been on antidepressants very long time and wondered what his emotional state would be like off the medication.  This was partly influenced by online patient for him to described problems with withdrawal coming off of SSRIs.  He successfully came off antidepressant without withdrawal symptoms.  But then has had relapse of both depression and anxiety within the last 2 weeks or about 6 weeks off the antidepressant  He still has ambivalence about taking psychiatric medications in general.  He would like to stop what he can but does recognize that he needs an antidepressant.  Discussed these issues in detail.  Discussed options of weaning some other medications if he gets a good response from the antidepressant as expected.  Agrees to restart duloxetine for depression and anxiety.  This should help depression and anxiety but with less blunting effect than Zoloft.  We have addressed and discussed in detail side effects including withdrawal effects which were 1 of the things he read about on the Internet.  We discussed the potential pain benefit.  If he gets pain benefit he wants to consider tapering gabapentin.  We discussed however not to change gabapentin now because it can sometimes help anxiety as well and his exam anxiety is already bad. Start duloxetine 30 mg daily for  4 to 5 days and if well tolerated increase to 60  mg daily. He previously took 90 mg briefly and tolerated it but because he has been off antidepressants a while and we are wanting to minimize side effect risk we should stop it 60 mg and reevaluate.  Discussed his chronic thoughts about death.  He commits to safety and is not having suicidal thoughts.  He will remain on lamotrigine 150 mg daily  He wants to consider taper gabapentin at follow up.  Option retry lower dose of Buspirone.  Follow-up 6-week  Meredith Staggers, MD, DFAPA     Please see After Visit Summary for patient specific instructions.  No future appointments.  No orders of the defined types were placed in this encounter.     -------------------------------

## 2019-03-17 ENCOUNTER — Ambulatory Visit (INDEPENDENT_AMBULATORY_CARE_PROVIDER_SITE_OTHER): Payer: 59 | Admitting: Psychiatry

## 2019-03-17 ENCOUNTER — Other Ambulatory Visit: Payer: Self-pay

## 2019-03-17 ENCOUNTER — Encounter: Payer: Self-pay | Admitting: Psychiatry

## 2019-03-17 DIAGNOSIS — G8929 Other chronic pain: Secondary | ICD-10-CM

## 2019-03-17 DIAGNOSIS — F1021 Alcohol dependence, in remission: Secondary | ICD-10-CM

## 2019-03-17 DIAGNOSIS — F411 Generalized anxiety disorder: Secondary | ICD-10-CM | POA: Diagnosis not present

## 2019-03-17 DIAGNOSIS — F4010 Social phobia, unspecified: Secondary | ICD-10-CM

## 2019-03-17 DIAGNOSIS — F331 Major depressive disorder, recurrent, moderate: Secondary | ICD-10-CM

## 2019-03-17 DIAGNOSIS — F4310 Post-traumatic stress disorder, unspecified: Secondary | ICD-10-CM

## 2019-03-17 MED ORDER — DULOXETINE HCL 60 MG PO CPEP: 60.0000 mg | ORAL_CAPSULE | Freq: Every day | ORAL | 1 refills | Status: DC

## 2019-03-17 NOTE — Progress Notes (Signed)
Adam Harvey Enns [id number] October 02, 1960 58 y.o.   Virtual Visit via Telephone Note  I connected with pt by telephone and verified that I am speaking with the correct person using two identifiers.   I discussed the limitations, risks, security and privacy concerns of performing an evaluation and management service by telephone and the availability of in person appointments. I also discussed with the patient that there may be a patient responsible charge related to this service. The patient expressed understanding and agreed to proceed.  I discussed the assessment and treatment plan with the patient. The patient was provided an opportunity to ask questions and all were answered. The patient agreed with the plan and demonstrated an understanding of the instructions.   The patient was advised to call back or seek an in-person evaluation if the symptoms worsen or if the condition fails to improve as anticipated.  I provided 25 minutes of non-face-to-face time during this encounter. The call started at 1025 and ended at 1050. The patient was located at home and the provider was located office.   Subjective:   Patient ID:  Adam Harvey Adam Harvey is a 58 y.o. (DOB October 02, 1960) male.  Chief Complaint:  Chief Complaint  Patient presents with  . Follow-up    Medication Management, med change  . Anxiety    Medication Management  . Other    PTSD  . Medication Refill    Cymb[facility] [employer]    Anxiety Patient reports no confusion, decreased concentration, nervous/anxious behavior or suicidal ideas.    Medication Refill Pertinent negatives include no arthralgias, fatigue or weakness.   Adam Harvey presents to the office today for follow-up of several diagnoses and wanted an earlier appointment because he wanted to restart duloxetine.  Last visit January 16, 2019.  He had wanted a trial off of antidepressants in part because of reading on the Internet about withdrawal symptoms.  He had been off  antidepressants for 2 months.  And felt more anxious and depressed.  We discussed it and decided to return to duloxetine and increase to 60 mg daily.  It's great.  No depression but some anxiety that is managed.  He's 9/10 and satisfied.  Pleased with the dosage..  Tolerated well.  Sleep is good.  Better self care and better eating.  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  Energy better. Good appetite.  For years has had thoughts of if got terminal disease it would be easier and they are worse.  Hopelessness resolved.  Wants to talk about Gabapentin for pain.  Previously thought it helped.  Wants to try to stop it and see if he can do without it.  Knees don't hurt bc replaced.  Doing AA by internet.  Past Psychiatric Medication Trials:  Brief duloxetine, sertraline 200, buspirone dizzy, gabapentin  Review of Systems:  Review of Systems  Constitutional: Negative for fatigue.  Musculoskeletal: Negative for arthralgias.  Neurological: Negative for tremors and weakness.  Psychiatric/Behavioral: Negative for agitation, behavioral problems, confusion, decreased concentration, dysphoric mood, hallucinations, self-injury, sleep disturbance and suicidal ideas. The patient is not nervous/anxious and is not hyperactive.     Medications: I have reviewed the patient's current medications.  Current Outpatient Medications  Medication Sig Dispense Refill  . aspirin EC 81 MG tablet Take 1 tablet (81 mg total) by mouth 2 (two) times daily. (Patient taking differently: Take 81 mg by mouth daily. ) 60 tablet 0  . DULoxetine (CYMBALTA) 60 MG capsule Take 1  capsule (60 mg total) by mouth daily. 90 capsule 1  . gabapentin (NEURONTIN) 300 MG capsule Take 1 capsule (300 mg total) by mouth 3 (three) times daily. (Patient taking differently: Take 300 mg by mouth 2 (two) times daily. ) 90 capsule 2  . lamoTRIgine (LAMICTAL) 150 MG tablet Take 1 tablet (150 mg total) by mouth at  bedtime. 90 tablet 1  . lisinopril-hydrochlorothiazide (PRINZIDE,ZESTORETIC) 20-12.5 MG per tablet Take 1 tablet by mouth 2 (two) times daily.     . Multiple Vitamins-Minerals (MULTIVITAMIN WITH MINERALS) tablet Take 1 tablet by mouth daily.    Marland Kitchen. omeprazole (PRILOSEC) 20 MG capsule Take 20 mg by mouth 2 (two) times daily.    . propranolol (INDERAL) 40 MG tablet Take 40 mg by mouth 2 (two) times daily.    . simvastatin (ZOCOR) 40 MG tablet Take 40 mg by mouth daily.      No current facility-administered medications for this visit.     Medication Side Effects: None  Allergies: No Known Allergies  Past Medical History:  Diagnosis Date  . Alcoholism (HCC)    sober since 01-2011   . Anxiety   . Arthritis   . Complication of anesthesia    unsuccessful spinal with right knee replacement, used general anesthesia  . Depression   . Diverticulosis 2012  . GERD (gastroesophageal reflux disease)   . Hemorrhoids   . Hyperlipidemia   . Hypertension   . Internal hemorrhoids 2012  . Obsessive compulsive disorder   . Osteoarthritis   . Rotator cuff tear, left   . Tear of right rotator cuff 08/03/2014  . Wears contact lenses     Family History  Problem Relation Age of Onset  . Colon cancer Neg Hx     Social History   Socioeconomic History  . Marital status: Divorced    Spouse name: Not on file  . Number of children: 3  . Years of education: Not on file  . Highest education level: Not on file  Occupational History  . Occupation: Tree surgeonrogram Director  Social Needs  . Financial resource strain: Not on file  . Food insecurity    Worry: Not on file    Inability: Not on file  . Transportation needs    Medical: Not on file    Non-medical: Not on file  Tobacco Use  . Smoking status: Former Smoker    Types: E-cigarettes    Quit date: 08/01/2010    Years since quitting: 8.6  . Smokeless tobacco: Never Used  Substance and Sexual Activity  . Alcohol use: No    Comment: not since 2012   . Drug use: No  . Sexual activity: Not on file    Comment: uses e-cig  Lifestyle  . Physical activity    Days per week: Not on file    Minutes per session: Not on file  . Stress: Not on file  Relationships  . Social Musicianconnections    Talks on phone: Not on file    Gets together: Not on file    Attends religious service: Not on file    Active member of club or organization: Not on file    Attends meetings of clubs or organizations: Not on file    Relationship status: Not on file  . Intimate partner violence    Fear of current or ex partner: Not on file    Emotionally abused: Not on file    Physically abused: Not on file    Forced sexual  activity: Not on file  Other Topics Concern  . Not on file  Social History Narrative  . Not on file    Past Medical History, Surgical history, Social history, and Family history were reviewed and updated as appropriate.   Please see review of systems for further details on the patient's review from today.   Objective:   Physical Exam:  There were no vitals taken for this visit.  Physical Exam Neurological:     Mental Status: He is alert and oriented to person, place, and time.     Cranial Nerves: No dysarthria.  Psychiatric:        Attention and Perception: Attention normal. He does not perceive auditory hallucinations.        Mood and Affect: Mood is not anxious or depressed.        Speech: Speech normal.        Behavior: Behavior is cooperative.        Thought Content: Thought content normal. Thought content is not paranoid or delusional. Thought content does not include homicidal or suicidal ideation. Thought content does not include homicidal or suicidal plan.        Cognition and Memory: Cognition and memory normal.        Judgment: Judgment normal.     Comments: Talkative.     Lab Review:     Component Value Date/Time   NA 136 08/02/2018 0458   K 5.0 08/02/2018 0458   CL 102 08/02/2018 0458   CO2 27 08/02/2018 0458    GLUCOSE 138 (H) 08/02/2018 0458   BUN 26 (H) 08/02/2018 0458   CREATININE 1.10 08/02/2018 0458   CALCIUM 9.0 08/02/2018 0458   PROT 7.6 08/12/2017 1445   ALBUMIN 4.3 08/12/2017 1445   AST 29 08/12/2017 1445   ALT 26 08/12/2017 1445   ALKPHOS 60 08/12/2017 1445   BILITOT 1.0 08/12/2017 1445   GFRNONAA >60 08/02/2018 0458   GFRAA >60 08/02/2018 0458       Component Value Date/Time   WBC 14.6 (H) 08/02/2018 0458   RBC 4.06 (L) 08/02/2018 0458   HGB 12.9 (L) 08/02/2018 0458   HCT 37.8 (L) 08/02/2018 0458   PLT 205 08/02/2018 0458   MCV 93.1 08/02/2018 0458   MCH 31.8 08/02/2018 0458   MCHC 34.1 08/02/2018 0458   RDW 12.9 08/02/2018 0458   LYMPHSABS 1.4 07/26/2018 0954   MONOABS 0.6 07/26/2018 0954   EOSABS 0.2 07/26/2018 0954   BASOSABS 0.0 07/26/2018 0954    No results found for: POCLITH, LITHIUM   No results found for: PHENYTOIN, PHENOBARB, VALPROATE, CBMZ   .res Assessment: Plan:    Durene CalHunter was seen today for follow-up, anxiety, other and medication refill.  Diagnoses and all orders for this visit:  Major depressive disorder, recurrent episode, moderate (HCC) -     DULoxetine (CYMBALTA) 60 MG capsule; Take 1 capsule (60 mg total) by mouth daily.  GAD (generalized anxiety disorder) -     DULoxetine (CYMBALTA) 60 MG capsule; Take 1 capsule (60 mg total) by mouth daily.  PTSD (post-traumatic stress disorder) -     DULoxetine (CYMBALTA) 60 MG capsule; Take 1 capsule (60 mg total) by mouth daily.  Social anxiety disorder -     DULoxetine (CYMBALTA) 60 MG capsule; Take 1 capsule (60 mg total) by mouth daily.  Other chronic pain -     DULoxetine (CYMBALTA) 60 MG capsule; Take 1 capsule (60 mg total) by mouth daily.  Alcohol dependence, in remission (  [place])   Prior visit Patient stated he has been on antidepressants very long time and wondered what his emotional state would be like off the medication.  This was partly influenced by online patient for him to described  problems with withdrawal coming off of SSRIs.  He successfully came off antidepressant without withdrawal symptoms.  But then has had relapse of both depression and anxiety within a few weeks off the antidepressant  He has had a good response back on the duloxetine.  He feels more at peace about taking the medication now that he is confirmed clearly that it is helpful and necessary.  He is tolerating it well.  Continue duloxetine 60 mg for depression and anxiety.  We discussed the potential pain benefit.  He previously took 90 mg briefly and tolerated it but because he has been off antidepressants a while and we are wanting to minimize side effect risk we should stop it 60 mg.  Discussed his chronic thoughts about death. They resolved so far.  He commits to safety and is not having suicidal thoughts.  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    He will remain on lamotrigine 150 mg daily  He wants to consider taper gabapentin and that seems reasonable.  Option retry lower dose of Buspirone.  Follow-up 3 mos  Lynder Parents, MD, DFAPA     Please see After Visit Summary for patient specific instructions.  No future appointments.  No orders of the defined types were placed in this encounter.     -------------------------------

## 2019-04-01 ENCOUNTER — Other Ambulatory Visit: Payer: Self-pay | Admitting: Psychiatry

## 2019-07-10 ENCOUNTER — Telehealth: Payer: Self-pay | Admitting: Psychiatry

## 2019-07-10 NOTE — Telephone Encounter (Signed)
There are 2 options for his anxiety.  He can increase gabapentin up to 2 of the 300 mg capsules 3 times a day.  Or we can add mirtazapine 30 mg 1 nightly.  That is an antidepressant which can also help anxiety and sleep and depression.

## 2019-07-10 NOTE — Telephone Encounter (Signed)
Pt experiencing real high anxiety. Would like someone to call him to see what he can do. PT on Cymbalta.

## 2019-07-11 ENCOUNTER — Other Ambulatory Visit: Payer: Self-pay

## 2019-07-11 MED ORDER — MIRTAZAPINE 30 MG PO TABS: 30.0000 mg | ORAL_TABLET | Freq: Every day | ORAL | 0 refills | Status: DC

## 2019-07-12 ENCOUNTER — Other Ambulatory Visit: Payer: Self-pay

## 2019-07-12 MED ORDER — GABAPENTIN 300 MG PO CAPS: ORAL_CAPSULE | ORAL | 1 refills | Status: DC

## 2019-07-12 NOTE — Telephone Encounter (Signed)
Audel called following up from conversation yesterday.  You called in the mirtazapine but the pharmacy had to order it.  So last night he took 2 300mg  gabapentin before bed and took 1 this morning.  He is dramatically better!  Slept well.  So he would like to follow this regimen vs taking the mirtazapine.  Is this ok?  He said he probably has enough of the gabapentin to getting through the holiday weekend but then will need a refill.  [facility] [address] as noted in chart.

## 2019-07-12 NOTE — Telephone Encounter (Signed)
Yes it is fine to take the higher dose of gabapentin as he did last night.  He can leave off the mirtazapine

## 2019-07-12 NOTE — Telephone Encounter (Signed)
Rx submitted

## 2019-07-12 NOTE — Telephone Encounter (Signed)
Pt. Made aware. Please send to [facility] [address].

## 2019-07-25 ENCOUNTER — Other Ambulatory Visit: Payer: Self-pay

## 2019-07-25 MED ORDER — LAMOTRIGINE 150 MG PO TABS: 150.0000 mg | ORAL_TABLET | Freq: Every day | ORAL | 1 refills | Status: DC

## 2019-08-08 IMAGING — MR MR LUMBAR SPINE W/O CM
4 of 5 series · 25 of 48 positions shown · non-contrast
Comparison: Lumbar MRI 05/07/2015

CLINICAL DATA: Low back pain.  Left leg pain

EXAM:
MRI LUMBAR SPINE WITHOUT CONTRAST
TECHNIQUE: Multiplanar, multisequence MR imaging of the lumbar spine was
performed. No intravenous contrast was administered.

[Series 2: T2 · sagittal · 4.0mm · 0.55mm/px · 6 of 15 slices shown (1 of 2)]
[im 1/15]
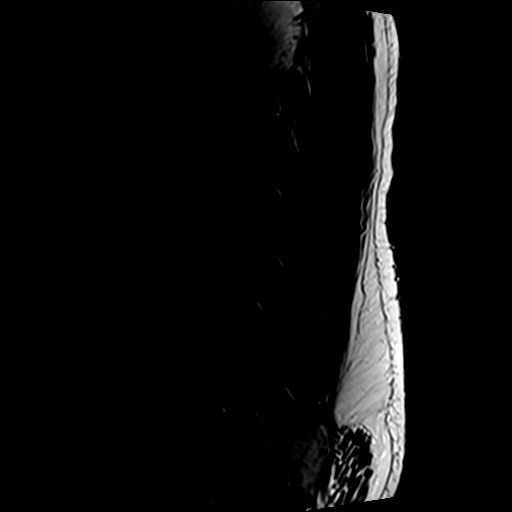
[im 3/15]
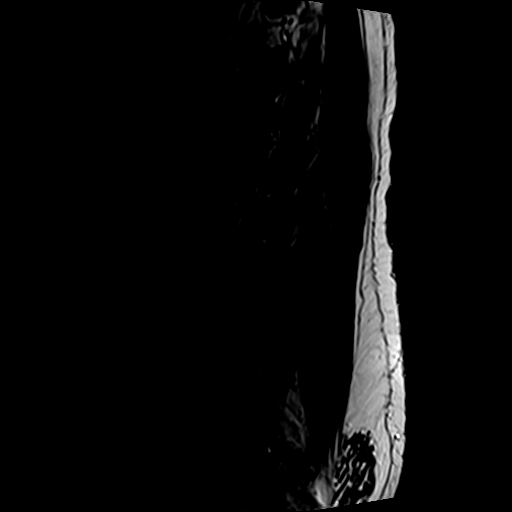
[im 6/15]
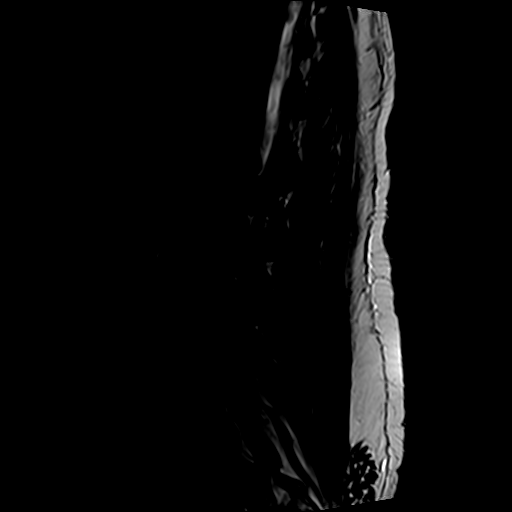
[im 9/15]
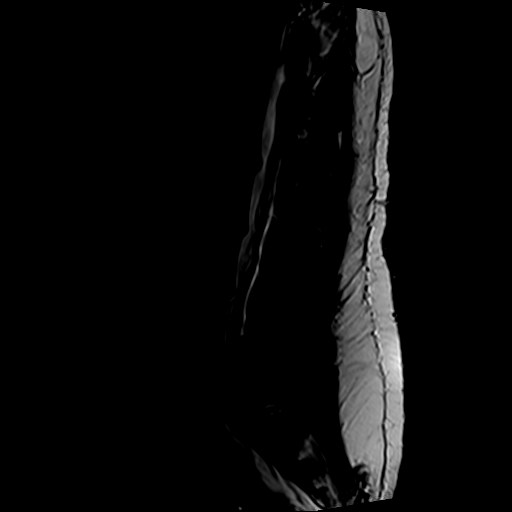
[im 12/15]
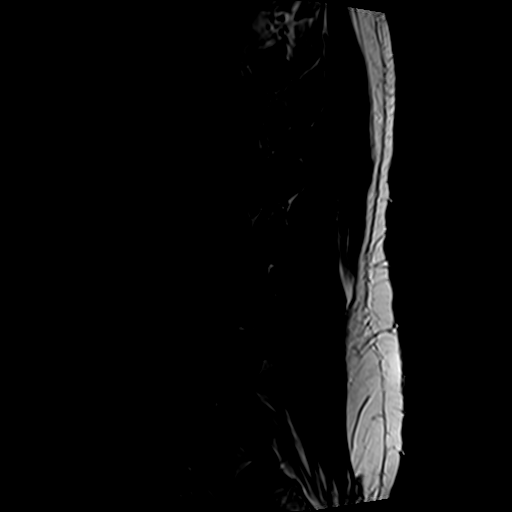
[im 15/15]
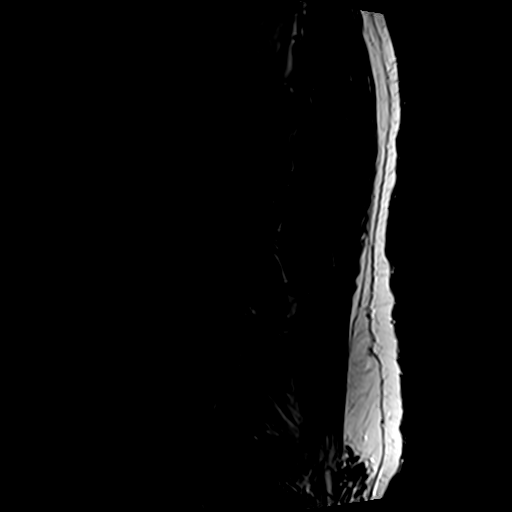

[Series 4: T1 · sagittal · 4.0mm · 0.55mm/px · 6 of 15 slices shown (1 of 2)]
[im 1/15]
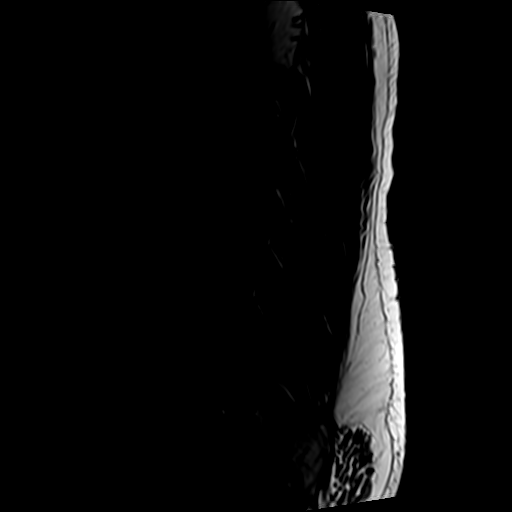
[im 3/15]
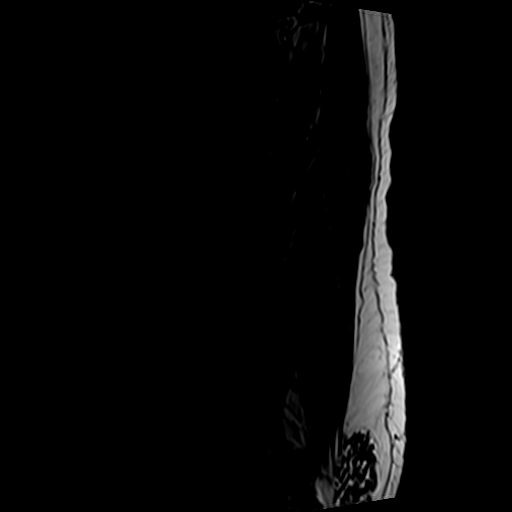
[im 6/15]
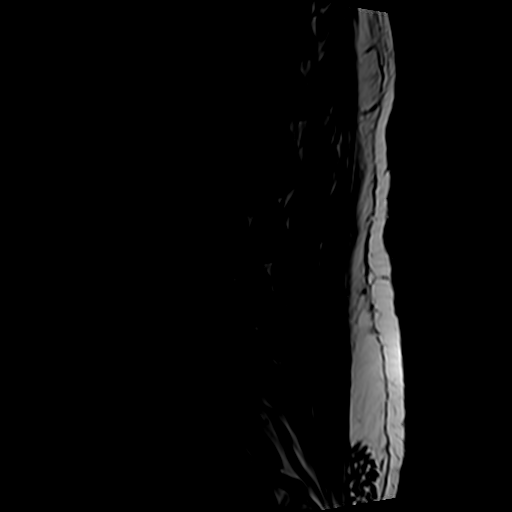
[im 9/15]
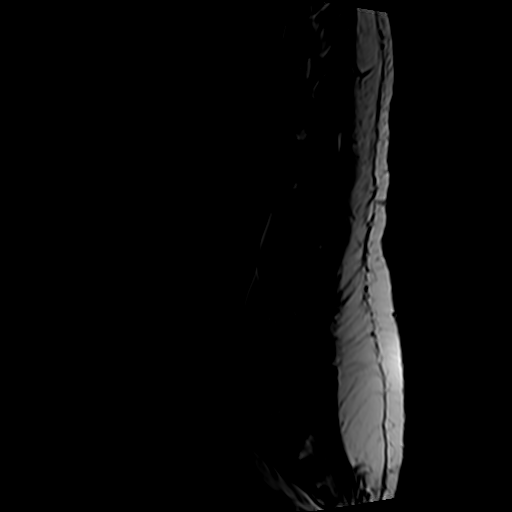
[im 12/15]
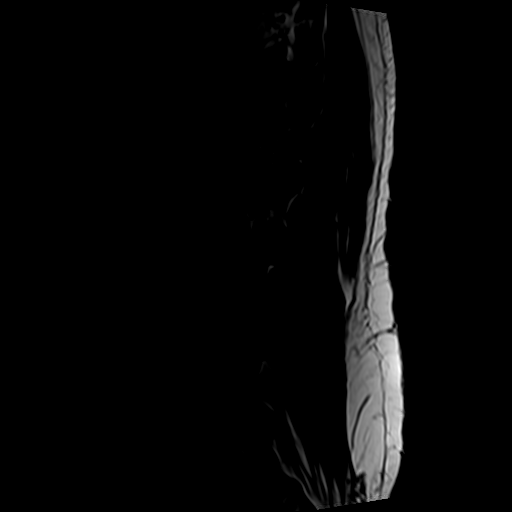
[im 15/15]
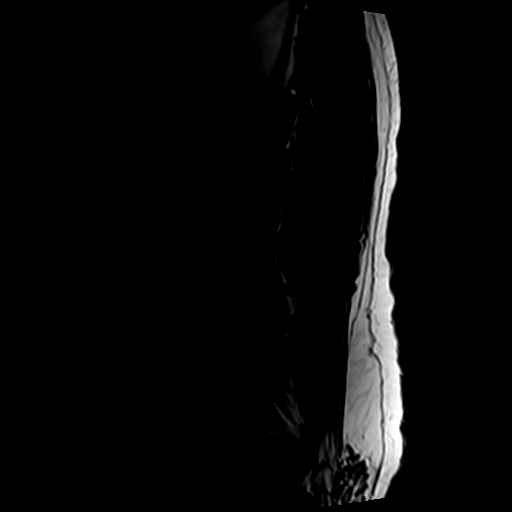

[Series 5: T2 · axial · 4.0mm · 0.70mm/px · z∈[-102,+122]mm · 9 of 36 slices shown (2 of 2)]
[im 1/36]
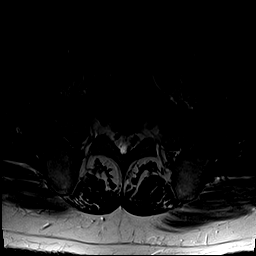
[im 6/36]
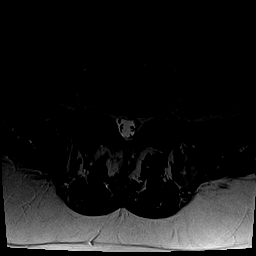
[im 11/36]
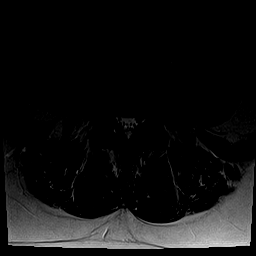
[im 16/36]
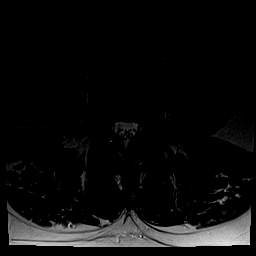
[im 18/36]
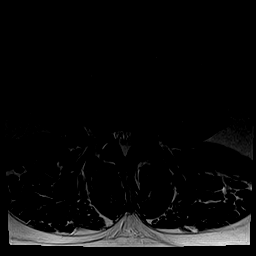
[im 21/36]
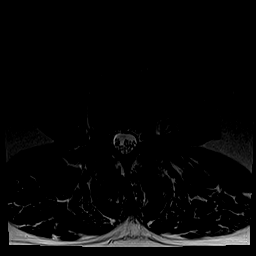
[im 26/36]
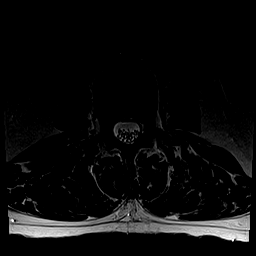
[im 31/36]
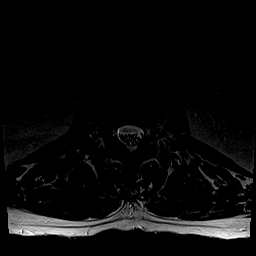
[im 36/36]
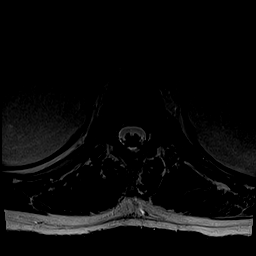

[Series 6: T1 · axial · 4.0mm · 0.35mm/px · z∈[-102,+78]mm · 4 of 36 slices shown (2 of 2)]
[im 1/36]
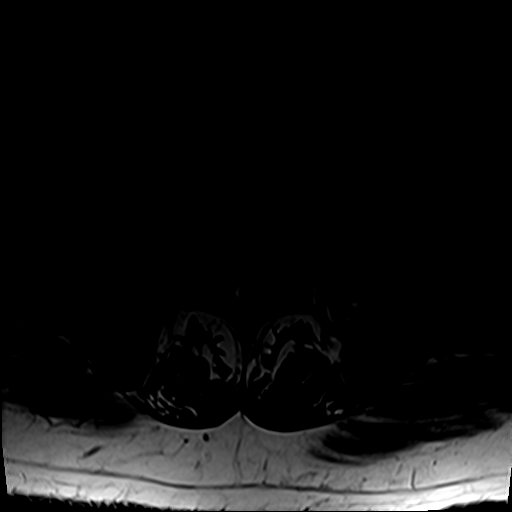
[im 6/36]
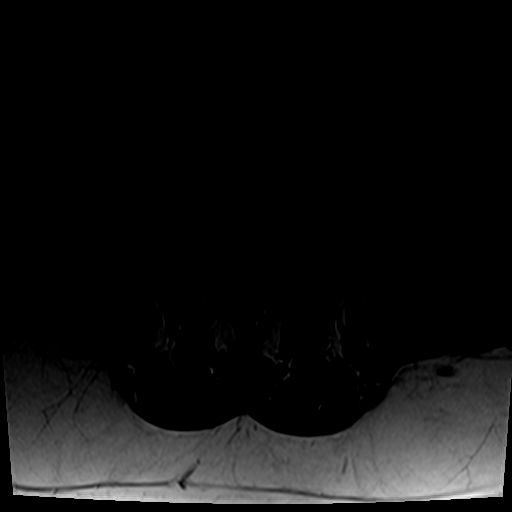
[im 18/36]
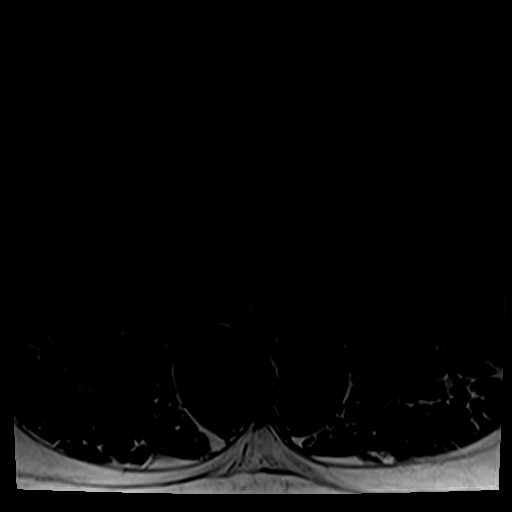
[im 31/36]
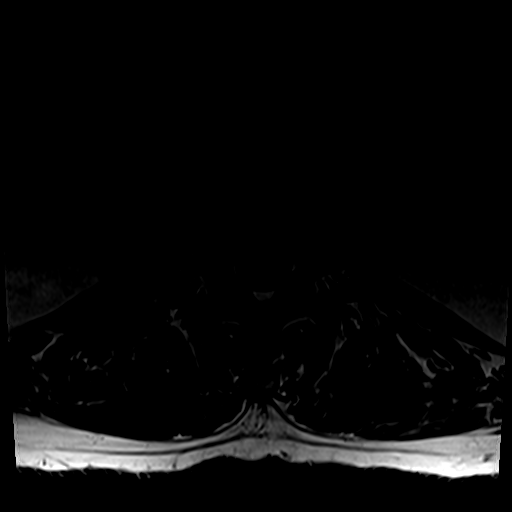

[25 of 48 positions shown; findings below may reference images not displayed]

FINDINGS: Segmentation:  Normal

Alignment:  Mild retrolisthesis L2-3, L3-4, L4-5

Vertebrae: Normal bone marrow. Negative for fracture or mass.
Schmorl's node with surrounding bone marrow edema inferior endplate
of L2 has progressed in the interval.

Conus medullaris and cauda equina: Conus extends to the L1-2 level.
Conus and cauda equina appear normal.

Paraspinal and other soft tissues: Negative for mass or adenopathy.
Paraspinous muscles normal.

Disc levels:

T12-L1: Negative

L1-2: Diffuse bulging of the disc similar to the prior study.
Negative for stenosis

L2-3: Diffuse disc bulging of the disc without significant stenosis.
Schmorl's node inferior endplate of L2 with surrounding edema has
progressed in the interval.

L3-4: Left-sided disc protrusion with upgoing disc material has
developed since prior study. Impingement of the left L4 nerve root
due to subarticular zone stenosis. Bilateral mild facet hypertrophy
and mild spinal stenosis

L4-5: Diffuse disc bulging and spurring with mild facet
degeneration. Mild spinal stenosis. Moderate to severe subarticular
foraminal stenosis on the right has progressed in the interval.

L5-S1: Mild facet degeneration without significant stenosis.
IMPRESSION: Interval development of small left-sided disc protrusion L3-4 with
expected impingement of the left L4 nerve root. Mild spinal stenosis
L3-4

Mild spinal stenosis L4-5. Progressive moderate to severe
subarticular and foraminal stenosis on the right L4-5.

## 2019-09-24 ENCOUNTER — Other Ambulatory Visit: Payer: Self-pay | Admitting: Psychiatry

## 2019-09-24 DIAGNOSIS — G8929 Other chronic pain: Secondary | ICD-10-CM

## 2019-09-24 DIAGNOSIS — F331 Major depressive disorder, recurrent, moderate: Secondary | ICD-10-CM

## 2019-09-24 DIAGNOSIS — F411 Generalized anxiety disorder: Secondary | ICD-10-CM

## 2019-09-24 DIAGNOSIS — F4010 Social phobia, unspecified: Secondary | ICD-10-CM

## 2019-09-24 DIAGNOSIS — F4310 Post-traumatic stress disorder, unspecified: Secondary | ICD-10-CM

## 2019-09-25 NOTE — Telephone Encounter (Signed)
Schedule apt

## 2019-09-26 NOTE — Telephone Encounter (Signed)
Last apt 02/2019 was due back 3 months

## 2019-09-27 ENCOUNTER — Other Ambulatory Visit: Payer: Self-pay

## 2019-10-25 ENCOUNTER — Ambulatory Visit (INDEPENDENT_AMBULATORY_CARE_PROVIDER_SITE_OTHER): Payer: 59 | Admitting: Psychiatry

## 2019-10-25 ENCOUNTER — Encounter: Payer: Self-pay | Admitting: Psychiatry

## 2019-10-25 DIAGNOSIS — F1021 Alcohol dependence, in remission: Secondary | ICD-10-CM

## 2019-10-25 DIAGNOSIS — F331 Major depressive disorder, recurrent, moderate: Secondary | ICD-10-CM

## 2019-10-25 DIAGNOSIS — F4010 Social phobia, unspecified: Secondary | ICD-10-CM

## 2019-10-25 DIAGNOSIS — G8929 Other chronic pain: Secondary | ICD-10-CM

## 2019-10-25 DIAGNOSIS — F4310 Post-traumatic stress disorder, unspecified: Secondary | ICD-10-CM | POA: Diagnosis not present

## 2019-10-25 DIAGNOSIS — F411 Generalized anxiety disorder: Secondary | ICD-10-CM | POA: Diagnosis not present

## 2019-10-25 MED ORDER — ARIPIPRAZOLE 5 MG PO TABS: ORAL_TABLET | ORAL | 1 refills | Status: DC

## 2019-10-25 NOTE — Progress Notes (Signed)
[name] [id number] 23-May-1961 59 y.o.   Virtual Visit via [facility]  I connected with pt by WebEx and verified that I am speaking with the correct person using two identifiers.   I discussed the limitations, risks, security and privacy concerns of performing an evaluation and management service by [facility] and the availability of in person appointments. I also discussed with the patient that there may be a patient responsible charge related to this service. The patient expressed understanding and agreed to proceed.  I discussed the assessment and treatment plan with the patient. The patient was provided an opportunity to ask questions and all were answered. The patient agreed with the plan and demonstrated an understanding of the instructions.   The patient was advised to call back or seek an in-person evaluation if the symptoms worsen or if the condition fails to improve as anticipated.  I provided 30 minutes of video time during this encounter. The call started at 1100 and ended at 11:30. The patient was located at home and the provider was located office.   Subjective:   Patient ID:  Adam Harvey is a 59 y.o. (DOB September 04, 1960) male.  Chief Complaint:  Chief Complaint  Patient presents with  . Follow-up    Medication Management  . Depression    Medication Management  . Anxiety    Anxiety Patient reports no confusion, decreased concentration, nervous/anxious behavior or suicidal ideas.    Medication Refill Pertinent negatives include no arthralgias, fatigue or weakness.   Adam Harvey presents to the office today for follow-up of several diagnoses and wanted an earlier appointment because he wanted to restart duloxetine.  visit January 16, 2019.  He had wanted a trial off of antidepressants in part because of reading on the Internet about withdrawal symptoms.  He had been off antidepressants for 2 months.  And felt more anxious and depressed.  We discussed it and decided to return  to duloxetine and increase to 60 mg daily.  Last visit in July after being back on the medication of duloxetine 60 mg daily he reported the following: It's great.  No depression but some anxiety that is managed.  He's 9/10 and satisfied.  Pleased with the dosage..  Tolerated well.  Sleep is good.  Better self care and better eating.  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  Energy better. Good appetite.  For years has had thoughts of if got terminal disease it would be easier and they are worse.  Hopelessness resolved. He had wanted to consider tapering off gabapentin and was given permission to do that if he chose to do so. He was continued on lamotrigine 150 mg daily.  Don't feel depressed and kind of happy but lingering thoughts of "is this as good as it gets?".  Not consuming him.  Not much excitement about things except work.  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  Some anxiety about being around people. Not DT Covid unless it made it worse.  Prefer to be alone.  Did a lot of theater in the past but not excited like he used to be.  Likes yard work.  Not interested in dating.   Wants to talk about Gabapentin for pain.  Previously thought it helped.  Wants to try to stop it and see if he can do without it.  Knees don't hurt bc replaced.  Doing AA by internet.  Past Psychiatric Medication Trials:  Brief duloxetine, sertraline 200, buspirone dizzy, gabapentin  Review of Systems:  Review of Systems  Constitutional: Negative for fatigue.  Gastrointestinal: Negative for abdominal distention.  Musculoskeletal: Negative for arthralgias.  Neurological: Negative for tremors and weakness.  Psychiatric/Behavioral: Negative for agitation, behavioral problems, confusion, decreased concentration, dysphoric mood, hallucinations, self-injury, sleep disturbance and suicidal ideas. The patient is not nervous/anxious and is not hyperactive.     Medications: I have reviewed  the patient's current medications.  Current Outpatient Medications  Medication Sig Dispense Refill  . aspirin EC 81 MG tablet Take 1 tablet (81 mg total) by mouth 2 (two) times daily. (Patient taking differently: Take 81 mg by mouth daily. ) 60 tablet 0  . DULoxetine (CYMBALTA) 60 MG capsule TAKE ONE CAPSULE BY MOUTH DAILY 90 capsule 0  . gabapentin (NEURONTIN) 300 MG capsule Take 1 capsule (300 mg) by mouth in the morning and 2 capsules at bedtime. 270 capsule 1  . lamoTRIgine (LAMICTAL) 150 MG tablet Take 1 tablet (150 mg total) by mouth at bedtime. 90 tablet 1  . lisinopril-hydrochlorothiazide (PRINZIDE,ZESTORETIC) 20-12.5 MG per tablet Take 1 tablet by mouth 2 (two) times daily.     . Multiple Vitamins-Minerals (MULTIVITAMIN WITH MINERALS) tablet Take 1 tablet by mouth daily.    Marland Kitchen omeprazole (PRILOSEC) 20 MG capsule Take 20 mg by mouth 2 (two) times daily.    . propranolol (INDERAL) 40 MG tablet Take 40 mg by mouth 2 (two) times daily.    . simvastatin (ZOCOR) 40 MG tablet Take 40 mg by mouth daily.      No current facility-administered medications for this visit.    Medication Side Effects: None  Allergies: No Known Allergies  Past Medical History:  Diagnosis Date  . Alcoholism (HCC)    sober since 01-2011   . Anxiety   . Arthritis   . Complication of anesthesia    unsuccessful spinal with right knee replacement, used general anesthesia  . Depression   . Diverticulosis 2012  . GERD (gastroesophageal reflux disease)   . Hemorrhoids   . Hyperlipidemia   . Hypertension   . Internal hemorrhoids 2012  . Obsessive compulsive disorder   . Osteoarthritis   . Rotator cuff tear, left   . Tear of right rotator cuff 08/03/2014  . Wears contact lenses     Family History  Problem Relation Age of Onset  . Colon cancer Neg Hx     Social History   Socioeconomic History  . Marital status: Divorced    Spouse name: Not on file  . Number of children: 3  . Years of education: Not  on file  . Highest education level: Not on file  Occupational History  . Occupation: Tree surgeon  Tobacco Use  . Smoking status: Former Smoker    Types: E-cigarettes    Quit date: 08/01/2010    Years since quitting: 9.2  . Smokeless tobacco: Never Used  Substance and Sexual Activity  . Alcohol use: No    Comment: not since 2012  . Drug use: No  . Sexual activity: Not on file    Comment: uses e-cig  Other Topics Concern  . Not on file  Social History Narrative  . Not on file   Social Determinants of Health   Financial Resource Strain:   . Difficulty of Paying Living Expenses: Not on file  Food Insecurity:   . Worried About Programme researcher, broadcasting/film/video in the Last Year: Not on file  . Ran Out of Food in the Last Year: Not on file  Transportation Needs:   . Freight forwarder (Medical): Not on file  . Lack of Transportation (Non-Medical): Not on file  Physical Activity:   . Days of Exercise per Week: Not on file  . Minutes of Exercise per Session: Not on file  Stress:   . Feeling of Stress : Not on file  Social Connections:   . Frequency of Communication with Friends and Family: Not on file  . Frequency of Social Gatherings with Friends and Family: Not on file  . Attends Religious Services: Not on file  . Active Member of Clubs or Organizations: Not on file  . Attends Banker Meetings: Not on file  . Marital Status: Not on file  Intimate Partner Violence:   . Fear of Current or Ex-Partner: Not on file  . Emotionally Abused: Not on file  . Physically Abused: Not on file  . Sexually Abused: Not on file    Past Medical History, Surgical history, Social history, and Family history were reviewed and updated as appropriate.   Please see review of systems for further details on the patient's review from today.   Objective:   Physical Exam:  There were no vitals taken for this visit.  Physical Exam Neurological:     Mental Status: He is alert and  oriented to person, place, and time.     Cranial Nerves: No dysarthria.  Psychiatric:        Attention and Perception: Attention normal. He does not perceive auditory hallucinations.        Mood and Affect: Mood is anxious. Mood is not depressed.        Speech: Speech normal.        Behavior: Behavior is cooperative.        Thought Content: Thought content normal. Thought content is not paranoid or delusional. Thought content does not include homicidal or suicidal ideation. Thought content does not include homicidal or suicidal plan.        Cognition and Memory: Cognition and memory normal.        Judgment: Judgment normal.     Comments: Talkative. Some anhedonia.     Lab Review:     Component Value Date/Time   NA 136 08/02/2018 0458   K 5.0 08/02/2018 0458   CL 102 08/02/2018 0458   CO2 27 08/02/2018 0458   GLUCOSE 138 (H) 08/02/2018 0458   BUN 26 (H) 08/02/2018 0458   CREATININE 1.10 08/02/2018 0458   CALCIUM 9.0 08/02/2018 0458   PROT 7.6 08/12/2017 1445   ALBUMIN 4.3 08/12/2017 1445   AST 29 08/12/2017 1445   ALT 26 08/12/2017 1445   ALKPHOS 60 08/12/2017 1445   BILITOT 1.0 08/12/2017 1445   GFRNONAA >60 08/02/2018 0458   GFRAA >60 08/02/2018 0458       Component Value Date/Time   WBC 14.6 (H) 08/02/2018 0458   RBC 4.06 (L) 08/02/2018 0458   HGB 12.9 (L) 08/02/2018 0458   HCT 37.8 (L) 08/02/2018 0458   PLT 205 08/02/2018 0458   MCV 93.1 08/02/2018 0458   MCH 31.8 08/02/2018 0458   MCHC 34.1 08/02/2018 0458   RDW 12.9 08/02/2018 0458   LYMPHSABS 1.4 07/26/2018 0954   MONOABS 0.6 07/26/2018 0954   EOSABS 0.2 07/26/2018 0954   BASOSABS 0.0 07/26/2018 0954    No results found for: POCLITH, LITHIUM   No results found for: PHENYTOIN, PHENOBARB, VALPROATE, CBMZ   .res Assessment: Plan:    [place] was seen today for follow-up,  depression and anxiety.  Diagnoses and all orders for this visit:  Major depressive disorder, recurrent episode, moderate  (HCC)  Social anxiety disorder  GAD (generalized anxiety disorder)  PTSD (post-traumatic stress disorder)  Alcohol dependence, in remission (HCC)  Other chronic pain   Prior visit Patient stated he has been on antidepressants very long time and wondered what his emotional state would be like off the medication.  This was partly influenced by online patient for him to described problems with withdrawal coming off of SSRIs.  He successfully came off antidepressant without withdrawal symptoms.  But then has had relapse of both depression and anxiety within a few weeks off the antidepressant  He has had a good response back on the duloxetine.  He feels more at peace about taking the medication now that he is confirmed clearly that it is helpful and necessary.  He is tolerating it well.  Continue duloxetine 60 mg for depression and anxiety.  We discussed the potential pain benefit.  He previously took 90 mg briefly and tolerated it but because he has been off antidepressants a while and we are wanting to minimize side effect risk we should stop it 60 mg.   Disc option Abilify 5 mg augmentation for residual anhedonia.  Yes he agrees.  If this is helpful then wean lamotrigine. Discussed potential metabolic side effects associated with atypical antipsychotics, as well as potential risk for movement side effects. Advised pt to contact office if movement side effects occur.   Discussed his chronic thoughts about death. They resolved so far.  He commits to safety and is not having suicidal thoughts.  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     Extensive discussion of how Covid has allowed social anxiety to get worse but is functional.   He will remain on lamotrigine 150 mg daily until we see if Abilify works.  He wants to consider taper gabapentin and that seems reasonable.   Option retry lower dose of Buspirone.   Support continued sobriety.  Continue AA.    Follow-up 2  mos  Meredith Staggers, MD, DFAPA     Please see After Visit Summary for patient specific instructions.  No future appointments.  No orders of the defined types were placed in this encounter.     -------------------------------

## 2019-12-19 ENCOUNTER — Other Ambulatory Visit: Payer: Self-pay | Admitting: Orthopedic Surgery

## 2019-12-19 DIAGNOSIS — M25521 Pain in right elbow: Secondary | ICD-10-CM

## 2019-12-20 ENCOUNTER — Other Ambulatory Visit: Payer: Self-pay | Admitting: Psychiatry

## 2019-12-20 DIAGNOSIS — F331 Major depressive disorder, recurrent, moderate: Secondary | ICD-10-CM

## 2019-12-20 DIAGNOSIS — G8929 Other chronic pain: Secondary | ICD-10-CM

## 2019-12-20 DIAGNOSIS — F4310 Post-traumatic stress disorder, unspecified: Secondary | ICD-10-CM

## 2019-12-20 DIAGNOSIS — F411 Generalized anxiety disorder: Secondary | ICD-10-CM

## 2019-12-20 DIAGNOSIS — F4010 Social phobia, unspecified: Secondary | ICD-10-CM

## 2019-12-25 ENCOUNTER — Telehealth (INDEPENDENT_AMBULATORY_CARE_PROVIDER_SITE_OTHER): Payer: 59 | Admitting: Psychiatry

## 2019-12-25 ENCOUNTER — Encounter: Payer: Self-pay | Admitting: Psychiatry

## 2019-12-25 ENCOUNTER — Ambulatory Visit: Payer: 59 | Admitting: Psychiatry

## 2019-12-25 DIAGNOSIS — F4010 Social phobia, unspecified: Secondary | ICD-10-CM

## 2019-12-25 DIAGNOSIS — F331 Major depressive disorder, recurrent, moderate: Secondary | ICD-10-CM | POA: Diagnosis not present

## 2019-12-25 DIAGNOSIS — F4310 Post-traumatic stress disorder, unspecified: Secondary | ICD-10-CM | POA: Diagnosis not present

## 2019-12-25 DIAGNOSIS — F1021 Alcohol dependence, in remission: Secondary | ICD-10-CM

## 2019-12-25 DIAGNOSIS — F411 Generalized anxiety disorder: Secondary | ICD-10-CM

## 2019-12-25 NOTE — Progress Notes (Signed)
Adam Harvey [id number] [birth year] 59 y.o.   Virtual Visit via [place]  I connected with pt by WebEx and verified that I am speaking with the correct person using two identifiers.   I discussed the limitations, risks, security and privacy concerns of performing an evaluation and management service by [facility] and the availability of in person appointments. I also discussed with the patient that there may be a patient responsible charge related to this service. The patient expressed understanding and agreed to proceed.  I discussed the assessment and treatment plan with the patient. The patient was provided an opportunity to ask questions and all were answered. The patient agreed with the plan and demonstrated an understanding of the instructions.   The patient was advised to call back or seek an in-person evaluation if the symptoms worsen or if the condition fails to improve as anticipated.  I provided 30 minutes of video time during this encounter. The call started at 1100 and ended at 11:30. The patient was located at home and the provider was located office.   Subjective:   Patient ID:  Adam Harvey is a 59 y.o. (DOB 12/12/1960) male.  Chief Complaint:  Chief Complaint  Patient presents with  . Follow-up  . Anxiety  . Depression    Anxiety Symptoms include nervous/anxious behavior. Patient reports no confusion, decreased concentration or suicidal ideas.    Medication Refill Pertinent negatives include no arthralgias, fatigue or weakness.   Adam Harvey presents to the office today for follow-up of several diagnoses and wanted an earlier appointment because he wanted to restart duloxetine.  visit January 16, 2019.  He had wanted a trial off of antidepressants in part because of reading on the Internet about withdrawal symptoms.  He had been off antidepressants for 2 months.  And felt more anxious and depressed.  We discussed it and decided to return to duloxetine and increase to 60 mg  daily.  visit in July after being back on the medication of duloxetine 60 mg daily he reported the following: It's great.  No depression but some anxiety that is managed.  He's 9/10 and satisfied.  Pleased with the dosage..  Tolerated well.  Sleep is good.  Better self care and better eating.  Working really hard but business is good.  In Holiday representative.  Location manager.  Energy better. Good appetite.  For years has had thoughts of if got terminal disease it would be easier and they are worse.  Hopelessness resolved. He had wanted to consider tapering off gabapentin and was given permission to do that if he chose to do so. He was continued on lamotrigine 150 mg daily.  Last visit October 16 2019.   Don't feel depressed and kind of happy but lingering thoughts of "is this as good as it gets?".  Not consuming him.  Not much excitement about things except work.  Doing design work for Teacher, early years/pre.  Some anxiety about being around people. Not DT Covid unless it made it worse.  Prefer to be alone.  Did a lot of theater in the past but not excited like he used to be.  Likes yard work.  Not interested in dating.  Wants to talk about Gabapentin for pain.  Previously thought it helped.  Wants to try to stop it and see if he can do without it.  Knees don't hurt bc replaced. Plan:  Cont duloxetine 60 and add Abilify 5 Taper gabapentin off  12/25/19 appt, the following is noted: Anxiety not good.  Weary of dealing with it.  Had trouble dealing with number of people on the beach this weekend DT anxiety.  At times would like a BZ.  Always felt anxious but not managing it well.  Isolating. No SE gabapentin.  Did not stop it bc he forgot it.  Abilify helped the depression and sense of dread but nothing for anxiety.  Doing AA by internet.  Past Psychiatric Medication Trials:  Brief duloxetine, sertraline 200, buspirone dizzy, gabapentin  Review of Systems:  Review of Systems  Constitutional: Negative for fatigue.   Gastrointestinal: Negative for abdominal distention.  Musculoskeletal: Negative for arthralgias.  Neurological: Negative for tremors and weakness.  Psychiatric/Behavioral: Negative for agitation, behavioral problems, confusion, decreased concentration, dysphoric mood, hallucinations, self-injury, sleep disturbance and suicidal ideas. The patient is nervous/anxious. The patient is not hyperactive.     Medications: I have reviewed the patient's current medications.  Current Outpatient Medications  Medication Sig Dispense Refill  . ARIPiprazole (ABILIFY) 5 MG tablet 1/2 tablet daily for 1 week and if not better then increase to 1 daily 30 tablet 1  . aspirin EC 81 MG tablet Take 1 tablet (81 mg total) by mouth 2 (two) times daily. (Patient taking differently: Take 81 mg by mouth daily. ) 60 tablet 0  . DULoxetine (CYMBALTA) 60 MG capsule TAKE ONE CAPSULE BY MOUTH DAILY 90 capsule 0  . gabapentin (NEURONTIN) 300 MG capsule Take 1 capsule (300 mg) by mouth in the morning and 2 capsules at bedtime. 270 capsule 1  . lamoTRIgine (LAMICTAL) 150 MG tablet Take 1 tablet (150 mg total) by mouth at bedtime. 90 tablet 1  . lisinopril-hydrochlorothiazide (PRINZIDE,ZESTORETIC) 20-12.5 MG per tablet Take 1 tablet by mouth 2 (two) times daily.     . Multiple Vitamins-Minerals (MULTIVITAMIN WITH MINERALS) tablet Take 1 tablet by mouth daily.    Marland Kitchen omeprazole (PRILOSEC) 20 MG capsule Take 20 mg by mouth 2 (two) times daily.    . propranolol (INDERAL) 40 MG tablet Take 40 mg by mouth 2 (two) times daily.    . simvastatin (ZOCOR) 40 MG tablet Take 40 mg by mouth daily.      No current facility-administered medications for this visit.    Medication Side Effects: None  Allergies: No Known Allergies  Past Medical History:  Diagnosis Date  . Alcoholism (HCC)    sober since 01-2011   . Anxiety   . Arthritis   . Complication of anesthesia    unsuccessful spinal with right knee replacement, used general  anesthesia  . Depression   . Diverticulosis 2012  . GERD (gastroesophageal reflux disease)   . Hemorrhoids   . Hyperlipidemia   . Hypertension   . Internal hemorrhoids 2012  . Obsessive compulsive disorder   . Osteoarthritis   . Rotator cuff tear, left   . Tear of right rotator cuff 08/03/2014  . Wears contact lenses     Family History  Problem Relation Age of Onset  . Colon cancer Neg Hx     Social History   Socioeconomic History  . Marital status: Divorced    Spouse name: Not on file  . Number of children: 3  . Years of education: Not on file  . Highest education level: Not on file  Occupational History  . Occupation: Tree surgeon  Tobacco Use  . Smoking status: Former Smoker    Types: E-cigarettes    Quit date: 08/01/2010    Years since quitting: 9.4  . Smokeless tobacco: Never Used  Substance and Sexual Activity  . Alcohol use: No    Comment: not since 2012  . Drug use: No  . Sexual activity: Not on file    Comment: uses e-cig  Other Topics Concern  . Not on file  Social History Narrative  . Not on file   Social Determinants of Health   Financial Resource Strain:   . Difficulty of Paying Living Expenses:   Food Insecurity:   . Worried About Charity fundraiser in the Last Year:   . Arboriculturist in the Last Year:   Transportation Needs:   . Film/video editor (Medical):   Marland Kitchen Lack of Transportation (Non-Medical):   Physical Activity:   . Days of Exercise per Week:   . Minutes of Exercise per Session:   Stress:   . Feeling of Stress :   Social Connections:   . Frequency of Communication with Friends and Family:   . Frequency of Social Gatherings with Friends and Family:   . Attends Religious Services:   . Active Member of Clubs or Organizations:   . Attends Archivist Meetings:   Marland Kitchen Marital Status:   Intimate Partner Violence:   . Fear of Current or Ex-Partner:   . Emotionally Abused:   Marland Kitchen Physically Abused:   . Sexually  Abused:     Past Medical History, Surgical history, Social history, and Family history were reviewed and updated as appropriate.   Please see review of systems for further details on the patient's review from today.   Objective:   Physical Exam:  There were no vitals taken for this visit.  Physical Exam Neurological:     Mental Status: He is alert and oriented to person, place, and time.     Cranial Nerves: No dysarthria.  Psychiatric:        Attention and Perception: Attention normal. He does not perceive auditory hallucinations.        Mood and Affect: Mood is anxious. Mood is not depressed.        Speech: Speech normal.        Behavior: Behavior is cooperative.        Thought Content: Thought content normal. Thought content is not paranoid or delusional. Thought content does not include homicidal or suicidal ideation. Thought content does not include homicidal or suicidal plan.        Cognition and Memory: Cognition and memory normal.        Judgment: Judgment normal.     Comments: Talkative. Anxiety.     Lab Review:     Component Value Date/Time   NA 136 08/02/2018 0458   K 5.0 08/02/2018 0458   CL 102 08/02/2018 0458   CO2 27 08/02/2018 0458   GLUCOSE 138 (H) 08/02/2018 0458   BUN 26 (H) 08/02/2018 0458   CREATININE 1.10 08/02/2018 0458   CALCIUM 9.0 08/02/2018 0458   PROT 7.6 08/12/2017 1445   ALBUMIN 4.3 08/12/2017 1445   AST 29 08/12/2017 1445   ALT 26 08/12/2017 1445   ALKPHOS 60 08/12/2017 1445   BILITOT 1.0 08/12/2017 1445   GFRNONAA >60 08/02/2018 0458   GFRAA >60 08/02/2018 0458       Component Value Date/Time   WBC 14.6 (H) 08/02/2018 0458   RBC 4.06 (L) 08/02/2018 0458   HGB 12.9 (L) 08/02/2018 0458   HCT 37.8 (L) 08/02/2018 0458   PLT 205 08/02/2018 0458   MCV 93.1 08/02/2018 0458   MCH 31.8 08/02/2018  0458   MCHC 34.1 08/02/2018 0458   RDW 12.9 08/02/2018 0458   LYMPHSABS 1.4 07/26/2018 0954   MONOABS 0.6 07/26/2018 0954   EOSABS 0.2  07/26/2018 0954   BASOSABS 0.0 07/26/2018 0954    No results found for: POCLITH, LITHIUM   No results found for: PHENYTOIN, PHENOBARB, VALPROATE, CBMZ   .res Assessment: Plan:    Geronimo was seen today for follow-up, anxiety and depression.  Diagnoses and all orders for this visit:  Major depressive disorder, recurrent episode, moderate (HCC)  Social anxiety disorder  GAD (generalized anxiety disorder)  PTSD (post-traumatic stress disorder)  Alcohol dependence, in remission (HCC)   Prior visit Patient stated he has been on antidepressants very long time and wondered what his emotional state would be like off the medication.  This was partly influenced by online patient for him to described problems with withdrawal coming off of SSRIs.  He successfully came off antidepressant without withdrawal symptoms.  But then has had relapse of both depression and anxiety within a few weeks off the antidepressant  He has had a good response back on the duloxetine.  He feels more at peace about taking the medication now that he is confirmed clearly that it is helpful and necessary.  He is tolerating it well.  Continue duloxetine 60 mg for depression and anxiety.  We discussed the potential pain benefit.  He previously took 90 mg briefly and tolerated it but because he has been off antidepressants a while and we are wanting to minimize side effect risk we should stop it 60 mg.   Continue Abilify 5 mg augmentation for residual anhedonia.  Yes he agrees.  If this is helpful then wean lamotrigine. Discussed potential metabolic side effects associated with atypical antipsychotics, as well as potential risk for movement side effects. Advised pt to contact office if movement side effects occur.   Discussed his chronic thoughts about death. They resolved so far.  He commits to safety and is not having suicidal thoughts.  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     Extensive discussion of  how Covid has allowed social anxiety to get worse but is functional.   Increase gabapentin to 300 BID and 600 HS for 2 days, then incrase to 2 in AM  And HS and 1 in middle of the day for 4 days, then increase to 600 TID.  For a week, if NR then increase to 900  TID.   Disc SE.    If satisfactory response then can taper lamotrigine over at least 2 weeks. To minimize SE or withdrawal and to minimize meds.    Option retry lower dose of Buspirone.   Support continued sobriety.  Continue AA.    Follow-up 6 weeks  Meredith Staggers, MD, DFAPA     Please see After Visit Summary for patient specific instructions.  No future appointments.  No orders of the defined types were placed in this encounter.     -------------------------------

## 2019-12-26 ENCOUNTER — Other Ambulatory Visit: Payer: Self-pay | Admitting: Psychiatry

## 2019-12-26 DIAGNOSIS — F331 Major depressive disorder, recurrent, moderate: Secondary | ICD-10-CM

## 2020-01-06 ENCOUNTER — Other Ambulatory Visit: Payer: Self-pay | Admitting: Psychiatry

## 2020-01-21 ENCOUNTER — Other Ambulatory Visit: Payer: Self-pay | Admitting: Psychiatry

## 2020-01-24 ENCOUNTER — Other Ambulatory Visit: Payer: Self-pay | Admitting: Psychiatry

## 2020-01-24 DIAGNOSIS — F331 Major depressive disorder, recurrent, moderate: Secondary | ICD-10-CM

## 2020-02-08 ENCOUNTER — Other Ambulatory Visit: Payer: Self-pay

## 2020-02-08 ENCOUNTER — Encounter: Payer: Self-pay | Admitting: Psychiatry

## 2020-02-08 ENCOUNTER — Ambulatory Visit (INDEPENDENT_AMBULATORY_CARE_PROVIDER_SITE_OTHER): Payer: 59 | Admitting: Psychiatry

## 2020-02-08 DIAGNOSIS — G8929 Other chronic pain: Secondary | ICD-10-CM

## 2020-02-08 DIAGNOSIS — F4010 Social phobia, unspecified: Secondary | ICD-10-CM | POA: Diagnosis not present

## 2020-02-08 DIAGNOSIS — F331 Major depressive disorder, recurrent, moderate: Secondary | ICD-10-CM | POA: Diagnosis not present

## 2020-02-08 DIAGNOSIS — F411 Generalized anxiety disorder: Secondary | ICD-10-CM

## 2020-02-08 DIAGNOSIS — F4310 Post-traumatic stress disorder, unspecified: Secondary | ICD-10-CM | POA: Diagnosis not present

## 2020-02-08 DIAGNOSIS — F1021 Alcohol dependence, in remission: Secondary | ICD-10-CM

## 2020-02-08 MED ORDER — DULOXETINE HCL 20 MG PO CPEP: ORAL_CAPSULE | ORAL | 0 refills | Status: DC

## 2020-02-08 MED ORDER — PAROXETINE HCL 20 MG PO TABS: 30.0000 mg | ORAL_TABLET | Freq: Every day | ORAL | 1 refills | Status: DC

## 2020-02-08 NOTE — Patient Instructions (Addendum)
Start one half of paroxetine 20 mg tablet and reduce duloxetine to 2 of the 20 mg capsules for 5 days, Then increase paroxetine to 1 tablet daily and reduce duloxetine to 1 capsule daily for 5 days, Then increase to 1-1/2 paroxetine tablets daily and stop duloxetine  Gabapentin by 1 capsule every 5 to 7 days until you are off of it.

## 2020-02-08 NOTE — Progress Notes (Signed)
[name] [id number] 13-Sep-1960 59 y.o.    Subjective:   Patient ID:  Adam Harvey is a 59 y.o. (DOB 1961-04-26) male.  Chief Complaint:  Chief Complaint  Patient presents with  . Follow-up    depression, anxiety, sleep  . Anxiety    Anxiety Symptoms include nervous/anxious behavior. Patient reports no confusion, decreased concentration or suicidal ideas.    Medication Refill Pertinent negatives include no arthralgias, fatigue or weakness.   Braysen Shuffield presents to the office today for follow-up of several diagnoses and wanted an earlier appointment because he wanted to restart duloxetine.  visit January 16, 2019.  He had wanted a trial off of antidepressants in part because of reading on the Internet about withdrawal symptoms.  He had been off antidepressants for 2 months.  And felt more anxious and depressed.  We discussed it and decided to return to duloxetine and increase to 60 mg daily.  visit in July after being back on the medication of duloxetine 60 mg daily he reported the following: It's great.  No depression but some anxiety that is managed.  He's 9/10 and satisfied.  Pleased with the dosage..  Tolerated well.  Sleep is good.  Better self care and better eating.  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  Energy better. Good appetite.  For years has had thoughts of if got terminal disease it would be easier and they are worse.  Hopelessness resolved. He had wanted to consider tapering off gabapentin and was given permission to do that if he chose to do so. He was continued on lamotrigine 150 mg daily.  visit October 16 2019.   Don't feel depressed and kind of happy but lingering thoughts of "is this as good as it gets?".  Not consuming him.  Not much excitement about things except work.  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  Some anxiety about being around people. Not DT Covid unless it made it worse.  Prefer to be alone.  Did a lot of  theater in the past but not excited like he used to be.  Likes yard work.  Not interested in dating.  Wants to talk about Gabapentin for pain.  Previously thought it helped.  Wants to try to stop it and see if he can do without it.  Knees don't hurt bc replaced. Plan:  Cont duloxetine 60 and add Abilify 5 Taper gabapentin off  12/25/19 appt, the following is noted: Anxiety not good.  Weary of dealing with it.  Had trouble dealing with number of people on the beach this weekend DT anxiety.  At times would like a BZ.  Always felt anxious but not managing it well.  Isolating. No SE gabapentin.  Did not stop it bc he forgot it. Plan: Increase gabapentin to 300 BID and 600 HS for 2 days, then incrase to 2 in AM  And HS and 1 in middle of the day for 4 days, then increase to 600 TID.  For a week, if NR then increase to 900  TID.    02/08/2020 appointment with the following noted: Increased gabapentin to 600 mg BID.  Has to nap if takes more.  No less anxious with the increase in dose.  No benefit to it. Asks about BZ.   Doesn't really want to leave the house and anxiety driving.  Can get anxious even in yard bc afraid of having to talk to people.  No paranoia. Dread of things.  Emotional eating.  Doing AA by internet.  Sober since 2006.  No history of BZ dependence.  Past Psychiatric Medication Trials:  Brief duloxetine, sertraline 200, buspirone dizzy, gabapentin,  Abilify helped the depression and sense of dread but nothing for anxiety.  Review of Systems:   Review of Systems  Constitutional: Positive for unexpected weight change. Negative for fatigue.  Gastrointestinal: Negative for abdominal distention.  Musculoskeletal: Negative for arthralgias.  Neurological: Negative for tremors and weakness.  Psychiatric/Behavioral: Positive for dysphoric mood. Negative for agitation, behavioral problems, confusion, decreased concentration, hallucinations, self-injury, sleep disturbance and suicidal  ideas. The patient is nervous/anxious. The patient is not hyperactive.     Medications: I have reviewed the patient's current medications.  Current Outpatient Medications  Medication Sig Dispense Refill  . ARIPiprazole (ABILIFY) 5 MG tablet TAKE ONE TABLET BY MOUTH DAILY 30 tablet 0  . aspirin EC 81 MG tablet Take 1 tablet (81 mg total) by mouth 2 (two) times daily. (Patient taking differently: Take 81 mg by mouth daily. ) 60 tablet 0  . DULoxetine (CYMBALTA) 20 MG capsule 2 daily for 1 week, then 1 daily for 1 week then stop it. 21 capsule 0  . gabapentin (NEURONTIN) 300 MG capsule TAKE ONE CAPSULE BY MOUTH EVERY MORNING AND TAKE TWO CAPSULES BY MOUTH EVERY NIGHT AT BEDTIME (Patient taking differently: Take 600 mg by mouth 2 (two) times daily. TAKE ONE CAPSULE BY MOUTH EVERY MORNING AND TAKE TWO CAPSULES BY MOUTH EVERY NIGHT AT BEDTIME) 270 capsule 0  . lamoTRIgine (LAMICTAL) 150 MG tablet TAKE 1 TABLET BY MOUTH AT BEDTIME 90 tablet 0  . lisinopril-hydrochlorothiazide (PRINZIDE,ZESTORETIC) 20-12.5 MG per tablet Take 1 tablet by mouth 2 (two) times daily.     . Multiple Vitamins-Minerals (MULTIVITAMIN WITH MINERALS) tablet Take 1 tablet by mouth daily.    Marland Kitchen omeprazole (PRILOSEC) 20 MG capsule Take 20 mg by mouth 2 (two) times daily.    . propranolol (INDERAL) 40 MG tablet Take 40 mg by mouth 2 (two) times daily.    . simvastatin (ZOCOR) 40 MG tablet Take 40 mg by mouth daily.     Marland Kitchen PARoxetine (PAXIL) 20 MG tablet Take 1.5 tablets (30 mg total) by mouth daily. 45 tablet 1   No current facility-administered medications for this visit.    Medication Side Effects: None  Allergies: No Known Allergies  Past Medical History:  Diagnosis Date  . Alcoholism (HCC)    sober since 01-2011   . Anxiety   . Arthritis   . Complication of anesthesia    unsuccessful spinal with right knee replacement, used general anesthesia  . Depression   . Diverticulosis 2012  . GERD (gastroesophageal reflux  disease)   . Hemorrhoids   . Hyperlipidemia   . Hypertension   . Internal hemorrhoids 2012  . Obsessive compulsive disorder   . Osteoarthritis   . Rotator cuff tear, left   . Tear of right rotator cuff 08/03/2014  . Wears contact lenses     Family History  Problem Relation Age of Onset  . Colon cancer Neg Hx     Social History   Socioeconomic History  . Marital status: Divorced    Spouse name: Not on file  . Number of children: 3  . Years of education: Not on file  . Highest education level: Not on file  Occupational History  . Occupation: Tree surgeon  Tobacco Use  . Smoking status: Former Smoker    Types: E-cigarettes    Quit  date: 08/01/2010    Years since quitting: 9.5  . Smokeless tobacco: Never Used  Vaping Use  . Vaping Use: Every day  . Start date: 08/12/2014  Substance and Sexual Activity  . Alcohol use: No    Comment: not since 2012  . Drug use: No  . Sexual activity: Not on file    Comment: uses e-cig  Other Topics Concern  . Not on file  Social History Narrative  . Not on file   Social Determinants of Health   Financial Resource Strain:   . Difficulty of Paying Living Expenses:   Food Insecurity:   . Worried About Programme researcher, broadcasting/film/video in the Last Year:   . Barista in the Last Year:   Transportation Needs:   . Freight forwarder (Medical):   Marland Kitchen Lack of Transportation (Non-Medical):   Physical Activity:   . Days of Exercise per Week:   . Minutes of Exercise per Session:   Stress:   . Feeling of Stress :   Social Connections:   . Frequency of Communication with Friends and Family:   . Frequency of Social Gatherings with Friends and Family:   . Attends Religious Services:   . Active Member of Clubs or Organizations:   . Attends Banker Meetings:   Marland Kitchen Marital Status:   Intimate Partner Violence:   . Fear of Current or Ex-Partner:   . Emotionally Abused:   Marland Kitchen Physically Abused:   . Sexually Abused:     Past  Medical History, Surgical history, Social history, and Family history were reviewed and updated as appropriate.   Please see review of systems for further details on the patient's review from today.   Objective:   Physical Exam:  There were no vitals taken for this visit.  Physical Exam Constitutional:      General: He is not in acute distress. Musculoskeletal:        General: No deformity.  Neurological:     Mental Status: He is alert and oriented to person, place, and time.     Cranial Nerves: No dysarthria.     Coordination: Coordination normal.  Psychiatric:        Attention and Perception: Attention and perception normal. He does not perceive auditory or visual hallucinations.        Mood and Affect: Mood is anxious and depressed. Affect is not labile, blunt, angry or inappropriate.        Speech: Speech normal.        Behavior: Behavior normal. Behavior is cooperative.        Thought Content: Thought content normal. Thought content is not paranoid or delusional. Thought content does not include homicidal or suicidal ideation. Thought content does not include homicidal or suicidal plan.        Cognition and Memory: Cognition and memory normal.        Judgment: Judgment normal.     Comments: Talkative. Anxiety.     Lab Review:     Component Value Date/Time   NA 136 08/02/2018 0458   K 5.0 08/02/2018 0458   CL 102 08/02/2018 0458   CO2 27 08/02/2018 0458   GLUCOSE 138 (H) 08/02/2018 0458   BUN 26 (H) 08/02/2018 0458   CREATININE 1.10 08/02/2018 0458   CALCIUM 9.0 08/02/2018 0458   PROT 7.6 08/12/2017 1445   ALBUMIN 4.3 08/12/2017 1445   AST 29 08/12/2017 1445   ALT 26 08/12/2017 1445   ALKPHOS 60  08/12/2017 1445   BILITOT 1.0 08/12/2017 1445   GFRNONAA >60 08/02/2018 0458   GFRAA >60 08/02/2018 0458       Component Value Date/Time   WBC 14.6 (H) 08/02/2018 0458   RBC 4.06 (L) 08/02/2018 0458   HGB 12.9 (L) 08/02/2018 0458   HCT 37.8 (L) 08/02/2018 0458    PLT 205 08/02/2018 0458   MCV 93.1 08/02/2018 0458   MCH 31.8 08/02/2018 0458   MCHC 34.1 08/02/2018 0458   RDW 12.9 08/02/2018 0458   LYMPHSABS 1.4 07/26/2018 0954   MONOABS 0.6 07/26/2018 0954   EOSABS 0.2 07/26/2018 0954   BASOSABS 0.0 07/26/2018 0954    No results found for: POCLITH, LITHIUM   No results found for: PHENYTOIN, PHENOBARB, VALPROATE, CBMZ   .res Assessment: Plan:    Durene CalHunter was seen today for follow-up and anxiety.  Diagnoses and all orders for this visit:  Major depressive disorder, recurrent episode, moderate (HCC) -     DULoxetine (CYMBALTA) 20 MG capsule; 2 daily for 1 week, then 1 daily for 1 week then stop it. -     PARoxetine (PAXIL) 20 MG tablet; Take 1.5 tablets (30 mg total) by mouth daily.  GAD (generalized anxiety disorder) -     DULoxetine (CYMBALTA) 20 MG capsule; 2 daily for 1 week, then 1 daily for 1 week then stop it. -     PARoxetine (PAXIL) 20 MG tablet; Take 1.5 tablets (30 mg total) by mouth daily.  PTSD (post-traumatic stress disorder) -     DULoxetine (CYMBALTA) 20 MG capsule; 2 daily for 1 week, then 1 daily for 1 week then stop it.  Social anxiety disorder -     DULoxetine (CYMBALTA) 20 MG capsule; 2 daily for 1 week, then 1 daily for 1 week then stop it.  Alcohol dependence, in remission (HCC)  Other chronic pain -     DULoxetine (CYMBALTA) 20 MG capsule; 2 daily for 1 week, then 1 daily for 1 week then stop it.   Prior visit Patient stated he has been on antidepressants very long time and wondered what his emotional state would be like off the medication.  This was partly influenced by online patient for him to described problems with withdrawal coming off of SSRIs.  He successfully came off antidepressant without withdrawal symptoms.  But then has had relapse of both depression and anxiety within a few weeks off the antidepressant  Consider switch paroxetine bc best antianxiety.    Switch to paroxetine 30 mg for better  antianxiety effect and hopefully help depression a little more.  Start one half of paroxetine 20 mg tablet and reduce duloxetine to 2 of the 20 mg capsules for 5 days, Then increase paroxetine to 1 tablet daily and reduce duloxetine to 1 capsule daily for 5 days, Then increase to 1-1/2 paroxetine tablets daily and stop duloxetine  Continue Abilify 5 mg augmentation for residual anhedonia.  Yes he agrees.  Reevaluate after paroxetine trial.  Disc DDI with paroxetine. Discussed potential metabolic side effects associated with atypical antipsychotics, as well as potential risk for movement side effects. Advised pt to contact office if movement side effects occur.   Discussed his chronic thoughts about death. They resolved so far.  He commits to safety and is not having suicidal thoughts.  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   Disc BZ.   Extensive discussion of how Covid has allowed social anxiety to get worse but is functional.   Wean gabapentin  DT sleepiness and NR for anxiety.     Option retry lower dose of Buspirone.   Support continued sobriety.  Continue AA.    Follow-up 6 weeks  Meredith Staggers, MD, DFAPA     Please see After Visit Summary for patient specific instructions.  No future appointments.  No orders of the defined types were placed in this encounter.     -------------------------------

## 2020-02-20 ENCOUNTER — Other Ambulatory Visit: Payer: Self-pay | Admitting: Psychiatry

## 2020-02-20 DIAGNOSIS — G8929 Other chronic pain: Secondary | ICD-10-CM

## 2020-02-20 DIAGNOSIS — F4010 Social phobia, unspecified: Secondary | ICD-10-CM

## 2020-02-20 DIAGNOSIS — F331 Major depressive disorder, recurrent, moderate: Secondary | ICD-10-CM

## 2020-02-20 DIAGNOSIS — F411 Generalized anxiety disorder: Secondary | ICD-10-CM

## 2020-02-20 DIAGNOSIS — F4310 Post-traumatic stress disorder, unspecified: Secondary | ICD-10-CM

## 2020-02-21 ENCOUNTER — Other Ambulatory Visit: Payer: Self-pay | Admitting: Psychiatry

## 2020-02-21 DIAGNOSIS — F331 Major depressive disorder, recurrent, moderate: Secondary | ICD-10-CM

## 2020-02-24 ENCOUNTER — Other Ambulatory Visit: Payer: Self-pay | Admitting: Psychiatry

## 2020-02-24 DIAGNOSIS — F331 Major depressive disorder, recurrent, moderate: Secondary | ICD-10-CM

## 2020-02-26 ENCOUNTER — Other Ambulatory Visit: Payer: Self-pay | Admitting: Psychiatry

## 2020-02-26 NOTE — Telephone Encounter (Signed)
Pt called to advise he has lost his Lamotrigine. Had 50-60 pills. Requesting refill for new Rx @ [facility] [address]. Did not take last night.Concerned about side effects. Please call [phone] with questions

## 2020-03-08 ENCOUNTER — Telehealth: Payer: Self-pay | Admitting: Psychiatry

## 2020-03-08 NOTE — Telephone Encounter (Signed)
Left detailed message with information.  

## 2020-03-08 NOTE — Telephone Encounter (Signed)
I think this is not directly an issue with paroxetine failure or SE but related to an interaction in which paroxetine increases the blood level of Abilify.  Also he's not been on the paroxetine for very long and it needs more time to help his anxiety.  So for his SE tremor, reduce paroxetine to 20 mg daily and stop Abilify for 3 days.  Then restart Abilify at 1/2 tablet daily.  It will take the tremor a week or more to go away.

## 2020-03-08 NOTE — Telephone Encounter (Signed)
Pt lm stating he is currently taking Paxil for his anxiety. He stated it is causing tremors in his hands and feet and not helping with the anxiety. Please advise.

## 2020-03-20 ENCOUNTER — Telehealth: Payer: Self-pay | Admitting: Psychiatry

## 2020-03-20 NOTE — Telephone Encounter (Signed)
Pt lm stating the shakiness in his right hand has not subsided. He also stated his medication is not helping with his anxiety. Next appt scheduled for 8/26. Please advise.

## 2020-03-20 NOTE — Telephone Encounter (Signed)
Patient did as instructed from previous phone call, reduced Paroxetine 20 mg and stopped Abilify for 3 days, restarted at 1/2 tablet. He said the tremors didn't change and his anxiety is not controlled with the Paroxetine.   Next apt is 04/11/2020

## 2020-03-20 NOTE — Telephone Encounter (Signed)
The most important med change was the change to paroxetine because it is potentially such a much better antianxiety medicine.  I do not think the tremor has anything to do with paroxetine.  For his anxiety therefore increase paroxetine to 40 mg daily and stop the Abilify entirely.

## 2020-03-21 ENCOUNTER — Other Ambulatory Visit: Payer: Self-pay

## 2020-03-21 DIAGNOSIS — F331 Major depressive disorder, recurrent, moderate: Secondary | ICD-10-CM

## 2020-03-21 DIAGNOSIS — F411 Generalized anxiety disorder: Secondary | ICD-10-CM

## 2020-03-21 MED ORDER — PAROXETINE HCL 40 MG PO TABS: 40.0000 mg | ORAL_TABLET | Freq: Every day | ORAL | 1 refills | Status: DC

## 2020-03-21 NOTE — Telephone Encounter (Signed)
Contacted patient with information to increase Paroxetine 40 mg daily and discontinue Abilify. Patient agreed. Updated Rx for Paroxetine submitted to [facility]. Has apt 04/11/2020

## 2020-03-22 ENCOUNTER — Telehealth: Payer: Self-pay | Admitting: Psychiatry

## 2020-03-22 NOTE — Telephone Encounter (Signed)
Rtc Rafiel's call and answered his questions.

## 2020-03-22 NOTE — Telephone Encounter (Signed)
Pt lm stating he spoke with Traci on yesterday but forgot to ask her a question. He is requesting a return call at your convenience today.

## 2020-03-27 ENCOUNTER — Telehealth: Payer: Self-pay | Admitting: Psychiatry

## 2020-03-27 NOTE — Telephone Encounter (Signed)
RTC to Dr. Lorain Childes and Marcello Fennel

## 2020-03-27 NOTE — Telephone Encounter (Signed)
Please call Dr. Lorain Childes re: mutual patient, Adam Harvey. He has questions about the Paxil.

## 2020-04-01 ENCOUNTER — Telehealth: Payer: Self-pay | Admitting: Psychiatry

## 2020-04-01 NOTE — Telephone Encounter (Signed)
Okay per his request but in doing so he is making a decision to evaluate his tremor at risk of worsening anxiety.  If he is okay with doing so then reduce the paroxetine to 10 mg daily for 2 weeks and then stop it.  In doing so he may be at some risk of dizziness after he stops it which could last a week or 2 potentially if he does not start another SSRI.  However he should not start another SSRI if he is trying to determine if Paxil is causing tremor.

## 2020-04-01 NOTE — Telephone Encounter (Signed)
Pt said that his primary care DR wants to take him off paxil in 2 weeks. Pt currently taking 20mg t. Pt wants to come off it completely to see if the tremors stop. Please call.

## 2020-04-02 NOTE — Telephone Encounter (Signed)
Patient given instructions to taper down to 10 mg paroxetine daily for 2 weeks and stop. He was on 40 mg but tapered down to 20 mg on his own. He has apt on 08/26 and can follow up then.   Patient is asking Dr. Alwyn Ren thoughts on his tremor. He says it's constant in his right hand/arm, but when he goes to pick up a pen or any object it stops. Is this a normal tremor with an SSRI to just be localized on 1 hand/arm?? Occasionally his Left foot has a tremor but not often. When he's not doing anything his hand moves back and forth at least an inch.   Informed him I would ask and get back with him.

## 2020-04-04 NOTE — Telephone Encounter (Signed)
What he is describing is a resting tremor.  This can be caused by an SSRI.  It can be caused by caffeine, decongestants, certain other medications and can be a part of certain neurologic conditions including essential tremor or familial tremor among others.  It is apparent that his primary care doctor wants to rule out whether or not paroxetine is causing it by having him come off of the medication.

## 2020-04-05 NOTE — Telephone Encounter (Signed)
Patient aware of information and has follow up next week.

## 2020-04-08 ENCOUNTER — Other Ambulatory Visit: Payer: Self-pay | Admitting: Internal Medicine

## 2020-04-08 DIAGNOSIS — R251 Tremor, unspecified: Secondary | ICD-10-CM

## 2020-04-11 ENCOUNTER — Ambulatory Visit
Admission: RE | Admit: 2020-04-11 | Discharge: 2020-04-11 | Disposition: A | Discharge status: Home / Self Care | Payer: Self-pay | Source: Ambulatory Visit | Attending: Internal Medicine | Admitting: Internal Medicine

## 2020-04-11 ENCOUNTER — Ambulatory Visit: Payer: 59 | Admitting: Psychiatry

## 2020-04-11 DIAGNOSIS — R251 Tremor, unspecified: Secondary | ICD-10-CM

## 2020-04-11 MED ORDER — GADOBENATE DIMEGLUMINE 529 MG/ML IV SOLN
20.0000 mL | Freq: Once | INTRAVENOUS | Status: AC | PRN
  Administered 2020-04-11: 20 mL via INTRAVENOUS

## 2020-04-29 IMAGING — CR DG CHEST 2V
2 series · 2 of 2 positions shown · non-contrast
Comparison: Chest x-ray of 08/12/2017

CLINICAL DATA: Preop for knee surgery

EXAM:
CHEST - 2 VIEW

[w chest pa]
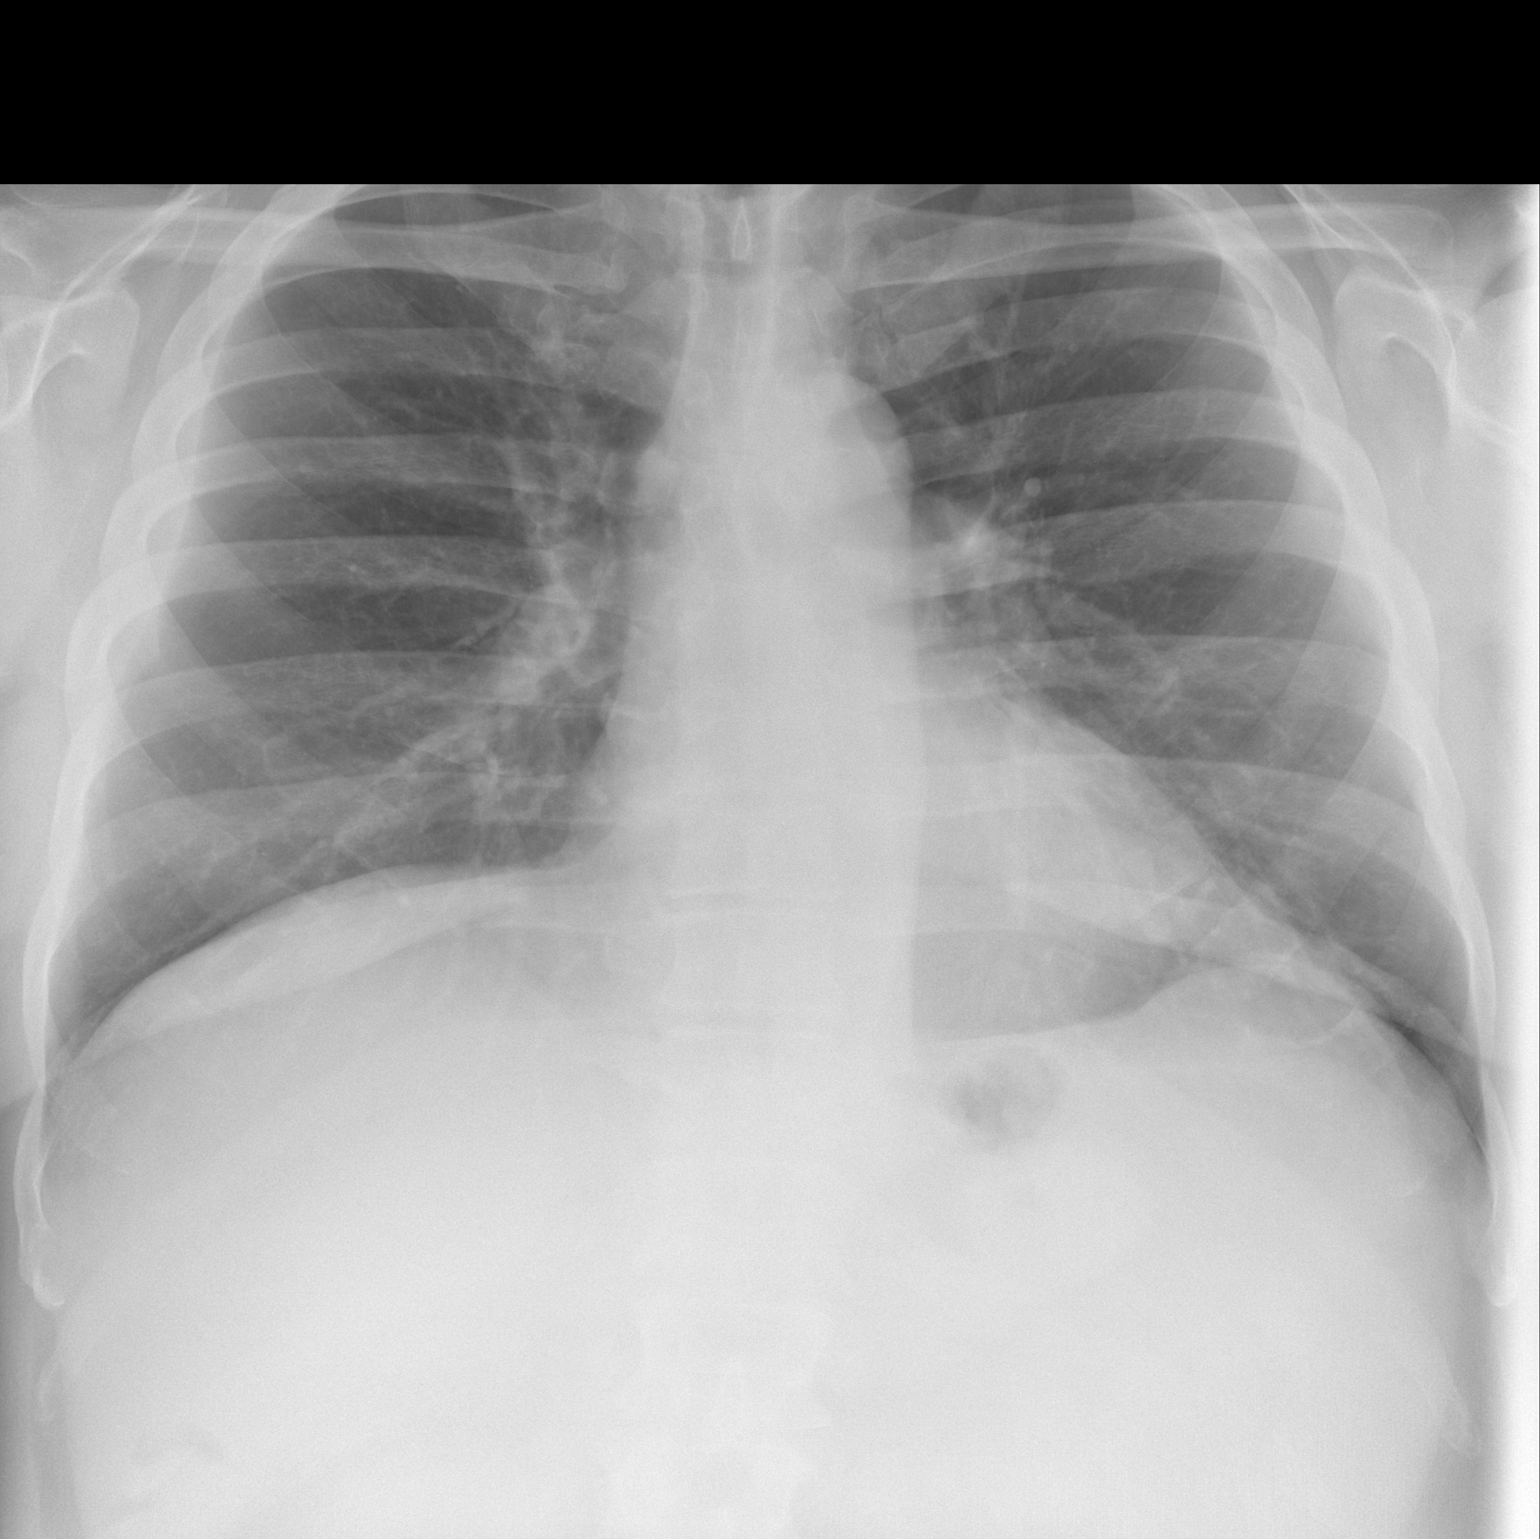

[w chest lat]
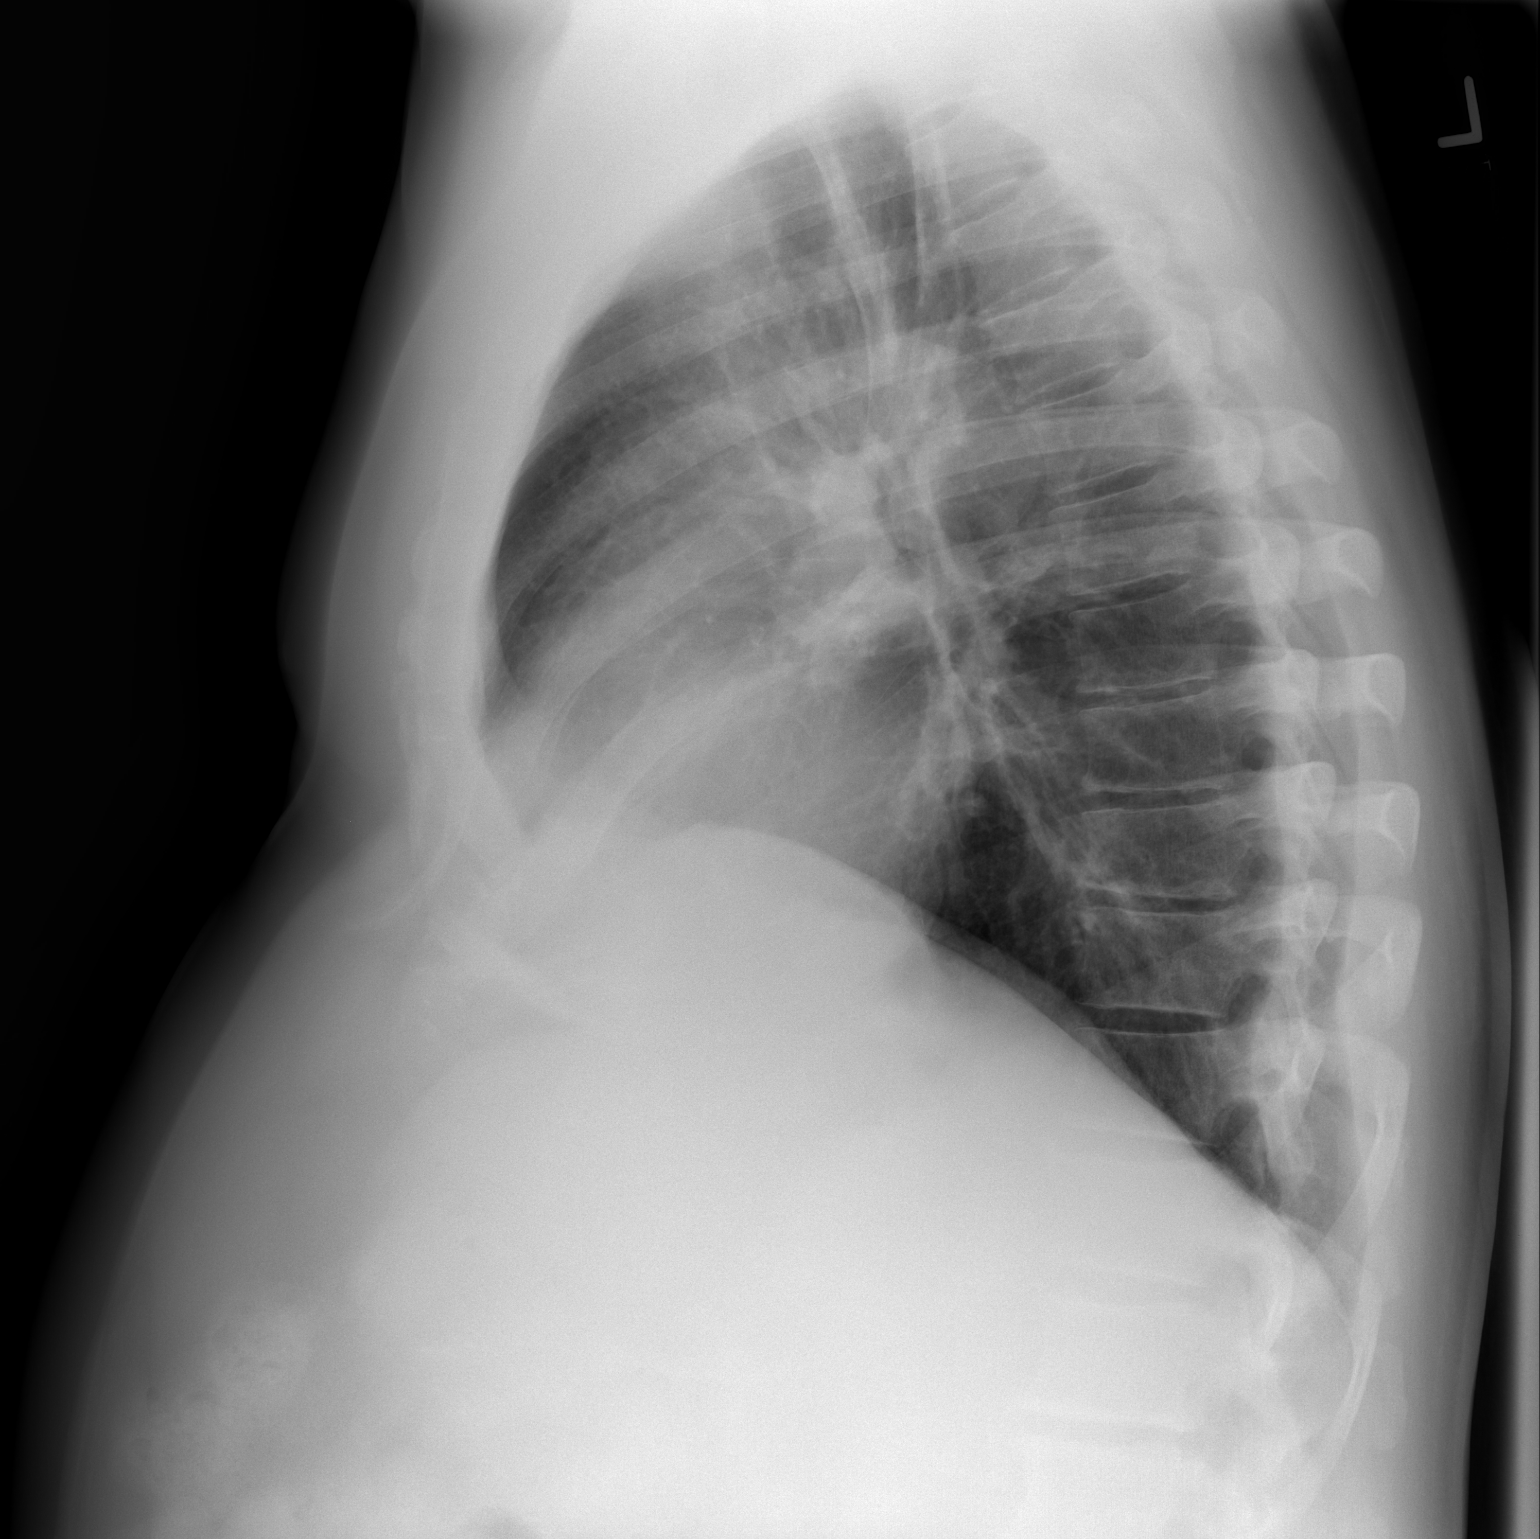

[2 of 2 positions shown; findings below may reference images not displayed]

FINDINGS: No active infiltrate or effusion is seen. There is mild
peribronchial thickening which may indicate bronchitis in this
patient with previous smoking history. The heart is within upper
limits of normal and stable. No bony abnormality is seen.
IMPRESSION: No active cardiopulmonary disease. Mild peribronchial thickening may
indicate bronchitis, possibly chronic.

## 2020-04-30 ENCOUNTER — Other Ambulatory Visit: Payer: Self-pay | Admitting: *Deleted

## 2020-04-30 ENCOUNTER — Encounter: Payer: Self-pay | Admitting: *Deleted

## 2020-05-01 ENCOUNTER — Ambulatory Visit: Payer: 59 | Admitting: Diagnostic Neuroimaging

## 2020-05-01 ENCOUNTER — Encounter: Payer: Self-pay | Admitting: Diagnostic Neuroimaging

## 2020-05-01 ENCOUNTER — Telehealth: Payer: Self-pay | Admitting: Psychiatry

## 2020-05-01 ENCOUNTER — Other Ambulatory Visit: Payer: Self-pay

## 2020-05-01 VITALS — BP 120/74 | HR 59 | Ht 72.0 in | Wt 267.6 lb

## 2020-05-01 DIAGNOSIS — G20 Parkinson's disease: Secondary | ICD-10-CM

## 2020-05-01 MED ORDER — CARBIDOPA-LEVODOPA 25-100 MG PO TABS: 1.0000 | ORAL_TABLET | Freq: Three times a day (TID) | ORAL | 6 refills | Status: DC

## 2020-05-01 NOTE — Telephone Encounter (Signed)
As we discussed at his last visit.,  The best antianxiety SSRI is paroxetine.  Because of concerns about tremors possibly being related he never got an adequate trial of paroxetine.  The dosage range is typically 20 to 40 mg daily.  I would suggest we go back with paroxetine and target a dose of 30 mg daily.  If he has a lot of the 20 mg tablets left he could start paroxetine 10 mg daily for 5 days, then 20 mg daily for 7 days and then increase to 30 mg daily or 1-1/2 of the 20 mg tablets.  An adequate trial means at least 6 weeks at the full dose.  Improvement could be seen in his symptoms 3 weeks but it really needs longer to have full benefit.

## 2020-05-01 NOTE — Telephone Encounter (Signed)
Pt LM that he has been diagnosed with Parkinson disease. Pt said  that he was taken off Paxil because of his tremors, but needs to be on something. Please call.

## 2020-05-01 NOTE — Progress Notes (Signed)
[facility]  PATIENT: Adam Harvey DOB: May 31, 1961  REFERRING CLINICIAN: Geoffry Paradise, MD HISTORY FROM: patient  REASON FOR VISIT: new consult    HISTORICAL  CHIEF COMPLAINT:  Chief Complaint  Patient presents with  . Tremors    rm 7 New Pt  "right hand tremors x 4 months- worse in AM, PM, increased by stress; stiff muscles; anxiety/apathy; wobbly getting into out of car; sound in my ears occasionally; right arm not swinging same as left arm "    HISTORY OF PRESENT ILLNESS:   59 year old right-handed male with hypertension, hypercholesterolemia, depression, anxiety, here for evaluation of tremors.  Patient is noticed 3 to 4 months of intermittent right hand resting tremor.  Also has noted stiffness in muscles, apathy, anxiety, balance difficulty, ringing in ears, restless sleep.  Patient has family history of Parkinson's in his grandmother.  No change in smell or taste.  Had MRI of the brain which was unremarkable.    REVIEW OF SYSTEMS: Full 14 system review of systems performed and negative with exception of: As per HPI.  ALLERGIES: No Known Allergies  HOME MEDICATIONS: Outpatient Medications Prior to Visit  Medication Sig Dispense Refill  . aspirin EC 81 MG tablet Take 1 tablet (81 mg total) by mouth 2 (two) times daily. (Patient taking differently: Take 81 mg by mouth daily. ) 60 tablet 0  . lamoTRIgine (LAMICTAL) 150 MG tablet TAKE ONE TABLET BY MOUTH AT BEDTIME 90 tablet 0  . lisinopril-hydrochlorothiazide (PRINZIDE,ZESTORETIC) 20-12.5 MG per tablet Take 1 tablet by mouth 2 (two) times daily.     . meloxicam (MOBIC) 15 MG tablet Take 15 mg by mouth daily.    . methocarbamol (ROBAXIN) 500 MG tablet Take 500 mg by mouth 3 (three) times daily.    . Multiple Vitamins-Minerals (MULTIVITAMIN WITH MINERALS) tablet Take 1 tablet by mouth daily.    Marland Kitchen omeprazole (PRILOSEC) 20 MG capsule Take 20 mg by mouth 2 (two) times daily.    . propranolol (INDERAL)  40 MG tablet Take 40 mg by mouth 2 (two) times daily.    . simvastatin (ZOCOR) 40 MG tablet Take 40 mg by mouth daily.     Marland Kitchen gabapentin (NEURONTIN) 300 MG capsule TAKE ONE CAPSULE BY MOUTH EVERY MORNING AND TAKE TWO CAPSULES BY MOUTH EVERY NIGHT AT BEDTIME (Patient not taking: Reported on 05/01/2020) 270 capsule 0  . PARoxetine (PAXIL) 40 MG tablet Take 1 tablet (40 mg total) by mouth daily. (Patient not taking: Reported on 05/01/2020) 30 tablet 1  . DULoxetine (CYMBALTA) 20 MG capsule 2 daily for 1 week, then 1 daily for 1 week then stop it. (Patient not taking: Reported on 05/01/2020) 21 capsule 0   No facility-administered medications prior to visit.    PAST MEDICAL HISTORY: Past Medical History:  Diagnosis Date  . Alcoholism (HCC)    sober since 01-2011   . Anxiety   . Arthritis   . Balance problem   . Complication of anesthesia    unsuccessful spinal with right knee replacement, used general anesthesia  . Depression   . Difficulty concentrating   . Diverticulosis 2012  . GERD (gastroesophageal reflux disease)   . Hemorrhoids   . Hyperlipidemia   . Hypertension   . Internal hemorrhoids 2012  . Obsessive compulsive disorder   . Osteoarthritis   . Rotator cuff tear, left   . Tear of right rotator cuff 08/03/2014  . Wears contact lenses     PAST SURGICAL HISTORY: Past Surgical History:  Procedure Laterality Date  . ABCESS DRAINAGE  2012   nasal cyst  . ACHILLES TENDON REPAIR  2008   right  . ARTHOSCOPIC ROTAOR CUFF REPAIR Right 08/03/2014   Procedure: ARTHROSCOPIC ROTATOR CUFF REPAIR;  Surgeon: Eulas Post, MD;  Location: [facility];  Service: Orthopedics;  Laterality: Right;  . COLONOSCOPY  2012  . INGUINAL HERNIA REPAIR     right as infant  . KNEE ARTHROSCOPY Left    x4  . KNEE SURGERY     x3-right  . ROTATOR CUFF REPAIR Left 2011    x 2  . TONSILLECTOMY    . TOTAL KNEE ARTHROPLASTY Right 12/2017   debride scar tissue  . TOTAL KNEE ARTHROPLASTY  Right 08/20/2017   Procedure: TOTAL KNEE ARTHROPLASTY;  Surgeon: Frederico Hamman, MD;  Location: [facility] OR;  Service: Orthopedics;  Laterality: Right;  . TOTAL KNEE ARTHROPLASTY Left 08/01/2018   Procedure: TOTAL KNEE ARTHROPLASTY;  Surgeon: Gean Birchwood, MD;  Location: [facility];  Service: Orthopedics;  Laterality: Left;  Adductor Block  . TYMPANOSTOMY TUBE PLACEMENT     as child, several times    FAMILY HISTORY: Family History  Problem Relation Age of Onset  . Stroke Mother   . Diabetes Mother   . Cancer Father   . Parkinson's disease Maternal Grandmother   . Colon cancer Neg Hx     SOCIAL HISTORY: Social History   Socioeconomic History  . Marital status: Divorced    Spouse name: Not on file  . Number of children: 3  . Years of education: Not on file  . Highest education level: Bachelor's degree (e.g., BA, AB, BS)  Occupational History  . Occupation: Tree surgeon  Tobacco Use  . Smoking status: Former Smoker    Types: E-cigarettes    Quit date: 08/01/2009    Years since quitting: 10.7  . Smokeless tobacco: Never Used  Vaping Use  . Vaping Use: Every day  . Start date: 08/12/2014  Substance and Sexual Activity  . Alcohol use: No    Comment: not since 2006  . Drug use: No    Comment: qut 1996  . Sexual activity: Not on file    Comment: uses e-cig  Other Topics Concern  . Not on file  Social History Narrative   Lives alone   No caffeine   Social Determinants of Health   Financial Resource Strain:   . Difficulty of Paying Living Expenses: Not on file  Food Insecurity:   . Worried About Programme researcher, broadcasting/film/video in the Last Year: Not on file  . Ran Out of Food in the Last Year: Not on file  Transportation Needs:   . Lack of Transportation (Medical): Not on file  . Lack of Transportation (Non-Medical): Not on file  Physical Activity:   . Days of Exercise per Week: Not on file  . Minutes of Exercise per Session: Not on file  Stress:   . Feeling of Stress : Not on file   Social Connections:   . Frequency of Communication with Friends and Family: Not on file  . Frequency of Social Gatherings with Friends and Family: Not on file  . Attends Religious Services: Not on file  . Active Member of Clubs or Organizations: Not on file  . Attends Banker Meetings: Not on file  . Marital Status: Not on file  Intimate Partner Violence:   . Fear of Current or Ex-Partner: Not on file  . Emotionally Abused: Not on  file  . Physically Abused: Not on file  . Sexually Abused: Not on file     PHYSICAL EXAM  GENERAL EXAM/CONSTITUTIONAL: Vitals:  Vitals:   05/01/20 1041  BP: 120/74  Pulse: (!) 59  Weight: 267 lb 9.6 oz (121.4 kg)  Height: 6' (1.829 m)     Body mass index is 36.29 kg/m. Wt Readings from Last 3 Encounters:  05/01/20 267 lb 9.6 oz (121.4 kg)  08/01/18 250 lb (113.4 kg)  07/26/18 250 lb (113.4 kg)     Patient is in no distress; well developed, nourished and groomed; neck is supple  CARDIOVASCULAR:  Examination of carotid arteries is normal; no carotid bruits  Regular rate and rhythm, no murmurs  Examination of peripheral vascular system by observation and palpation is normal  EYES:  Ophthalmoscopic exam of optic discs and posterior segments is normal; no papilledema or hemorrhages  No exam data present  MUSCULOSKELETAL:  Gait, strength, tone, movements noted in Neurologic exam below  NEUROLOGIC: MENTAL STATUS:  No flowsheet data found.  awake, alert, oriented to person, place and time  recent and remote memory intact  normal attention and concentration  language fluent, comprehension intact, naming intact  fund of knowledge appropriate  CRANIAL NERVE:   2nd - no papilledema on fundoscopic exam  2nd, 3rd, 4th, 6th - pupils equal and reactive to light, visual fields full to confrontation, extraocular muscles intact, no nystagmus  5th - facial sensation symmetric  7th - facial strength symmetric  8th -  hearing intact  9th - palate elevates symmetrically, uvula midline  11th - shoulder shrug symmetric  12th - tongue protrusion midline  MOTOR:   RESTING TREMOR IN RUE  COGWHEELING IN RUE  NO BRADYKINESIA  normal bulk and tone, full strength in the BUE, BLE  SENSORY:   normal and symmetric to light touch, temperature, vibration  COORDINATION:   finger-nose-finger, fine finger movements normal  REFLEXES:   deep tendon reflexes TRACE and symmetric  GAIT/STATION:   narrow based gait; SLIGHT DECR RIGHT ARM SWING     DIAGNOSTIC DATA (LABS, IMAGING, TESTING) - I reviewed patient records, labs, notes, testing and imaging myself where available.  Lab Results  Component Value Date   WBC 14.6 (H) 08/02/2018   HGB 12.9 (L) 08/02/2018   HCT 37.8 (L) 08/02/2018   MCV 93.1 08/02/2018   PLT 205 08/02/2018      Component Value Date/Time   NA 136 08/02/2018 0458   K 5.0 08/02/2018 0458   CL 102 08/02/2018 0458   CO2 27 08/02/2018 0458   GLUCOSE 138 (H) 08/02/2018 0458   BUN 26 (H) 08/02/2018 0458   CREATININE 1.10 08/02/2018 0458   CALCIUM 9.0 08/02/2018 0458   PROT 7.6 08/12/2017 1445   ALBUMIN 4.3 08/12/2017 1445   AST 29 08/12/2017 1445   ALT 26 08/12/2017 1445   ALKPHOS 60 08/12/2017 1445   BILITOT 1.0 08/12/2017 1445   GFRNONAA >60 08/02/2018 0458   GFRAA >60 08/02/2018 0458   No results found for: CHOL, HDL, LDLCALC, LDLDIRECT, TRIG, CHOLHDL No results found for: FWYO3Z No results found for: VITAMINB12 No results found for: TSH   04/11/20 MRI brain [I reviewed images myself and agree with interpretation. -[facility]P]  - No acute intracranial abnormality. No findings to explain patient's symptoms.    ASSESSMENT AND PLAN  59 y.o. year old male here with new onset of resting tremor, cogwheel rigidity, decreased arm swing with walking, unremarkable MRI of the brain.  Clinical  picture suggests idiopathic Parkinson's disease.  We will proceed with empiric trial of  carbidopa levodopa.  Reviewed diagnosis, prognosis and treatment options.  Dx:  1. Parkinson's disease (HCC)     PLAN:  RESTING TREMOR/ RIGIDITY / PARKINSONISM  - trial of carb/levo half tab three times a day; after 1-2 weeks increase to 1 tab three times a day   Meds ordered this encounter  Medications  . carbidopa-levodopa (SINEMET IR) 25-100 MG tablet    Sig: Take 1 tablet by mouth 3 (three) times daily before meals.    Dispense:  90 tablet    Refill:  6   Return in about 6 months (around 10/29/2020).    Suanne MarkerVIKRAM R. Nimrod Wendt, MD 05/01/2020, 11:35 AM Certified in Neurology, Neurophysiology and Neuroimaging  [facility] [address], [address] [place], [place] [address] [phone]

## 2020-05-01 NOTE — Patient Instructions (Signed)
-   trial of carb/levo half tab three times a day; after 1-2 weeks increase to 1 tab three times a day

## 2020-05-01 NOTE — Telephone Encounter (Signed)
Please review

## 2020-05-02 ENCOUNTER — Other Ambulatory Visit: Payer: Self-pay

## 2020-05-02 DIAGNOSIS — F331 Major depressive disorder, recurrent, moderate: Secondary | ICD-10-CM

## 2020-05-02 DIAGNOSIS — F411 Generalized anxiety disorder: Secondary | ICD-10-CM

## 2020-05-02 MED ORDER — PAROXETINE HCL 40 MG PO TABS: ORAL_TABLET | ORAL | 1 refills | Status: DC

## 2020-05-02 NOTE — Telephone Encounter (Signed)
Rtc to patient and he agreed to retry. He has 40 mg Paroxetine at home. He said he can quarter some for 5 days for 10 mg daily then increase to 20 mg. Advised him the best therapeutic range is typically 20 mg-40 mg. The goal is to get to 30 mg. He will call back when needing a new Rx or having any issues.

## 2020-05-02 NOTE — Progress Notes (Signed)
Patient restarting Paroxetine, he has 40 mg at home, advised to start with 10 mg (1/4 tablet) for 5 days, then increase to 20 mg (1/2 tablet) for 7 days, then goal is to get to 30 mg daily.

## 2020-05-23 ENCOUNTER — Other Ambulatory Visit: Payer: Self-pay | Admitting: Psychiatry

## 2020-05-24 ENCOUNTER — Other Ambulatory Visit: Payer: Self-pay | Admitting: Psychiatry

## 2020-05-24 DIAGNOSIS — F331 Major depressive disorder, recurrent, moderate: Secondary | ICD-10-CM

## 2020-05-24 DIAGNOSIS — F411 Generalized anxiety disorder: Secondary | ICD-10-CM

## 2020-06-03 ENCOUNTER — Other Ambulatory Visit: Payer: Self-pay | Admitting: Psychiatry

## 2020-06-03 ENCOUNTER — Telehealth: Payer: Self-pay | Admitting: Psychiatry

## 2020-06-03 MED ORDER — HYDROXYZINE HCL 10 MG PO TABS: 10.0000 mg | ORAL_TABLET | Freq: Three times a day (TID) | ORAL | 0 refills | Status: DC | PRN

## 2020-06-03 NOTE — Telephone Encounter (Signed)
Pt called and said that he has an appointment in november . He is having anxiety attacks constantly. His medicine is not working . Pleawse give heim a call with suggestions at [phone]

## 2020-06-03 NOTE — Telephone Encounter (Signed)
Rtc to patient and advised him to try Hydroxyzine in the interim of his apt. He hasn't heard from anyone yet but informed him they should be calling tomorrow.   He reports he's taking Paroxetine 40 mg instead btw.

## 2020-06-03 NOTE — Telephone Encounter (Signed)
OK to schedule Friday AM as work in.  Just restarted paroxetine about 1 month ago and was to gradually build up the dose to 30 mg daily.  Hopefully he's done that but it hasn't had time to fully work yet.  I will send in RX for non-addicting hydroxyzine prn anxiety to help in the interim.

## 2020-06-03 NOTE — Telephone Encounter (Signed)
Please review

## 2020-06-04 NOTE — Telephone Encounter (Signed)
Pt is scheduled for Friday.

## 2020-06-07 ENCOUNTER — Encounter: Payer: Self-pay | Admitting: Psychiatry

## 2020-06-07 ENCOUNTER — Other Ambulatory Visit: Payer: Self-pay

## 2020-06-07 ENCOUNTER — Ambulatory Visit (INDEPENDENT_AMBULATORY_CARE_PROVIDER_SITE_OTHER): Payer: 59 | Admitting: Psychiatry

## 2020-06-07 DIAGNOSIS — F4310 Post-traumatic stress disorder, unspecified: Secondary | ICD-10-CM | POA: Diagnosis not present

## 2020-06-07 DIAGNOSIS — F4001 Agoraphobia with panic disorder: Secondary | ICD-10-CM

## 2020-06-07 DIAGNOSIS — F331 Major depressive disorder, recurrent, moderate: Secondary | ICD-10-CM

## 2020-06-07 DIAGNOSIS — F411 Generalized anxiety disorder: Secondary | ICD-10-CM | POA: Diagnosis not present

## 2020-06-07 DIAGNOSIS — F1021 Alcohol dependence, in remission: Secondary | ICD-10-CM

## 2020-06-07 DIAGNOSIS — F4010 Social phobia, unspecified: Secondary | ICD-10-CM

## 2020-06-07 MED ORDER — HYDROXYZINE HCL 25 MG PO TABS: ORAL_TABLET | ORAL | 1 refills | Status: DC

## 2020-06-07 MED ORDER — LORAZEPAM 1 MG PO TABS: ORAL_TABLET | ORAL | 0 refills | Status: DC

## 2020-06-07 NOTE — Progress Notes (Signed)
Ahren Pettinger [id number] 11-24-1960 59 y.o.    Subjective:   Patient ID:  Adam Harvey is a 59 y.o. (DOB 05-Mar-1961) male.  Chief Complaint:  Chief Complaint  Patient presents with  . Follow-up    Medication Management  . Depression    Medication Management  . Anxiety    Anxiety Symptoms include nervous/anxious behavior. Patient reports no confusion, decreased concentration or suicidal ideas.    Medication Refill Pertinent negatives include no arthralgias, fatigue or weakness.   Mervyn Timothy presents to the office today for follow-up of several diagnoses and wanted an earlier appointment because he wanted to restart duloxetine.  visit January 16, 2019.  He had wanted a trial off of antidepressants in part because of reading on the Internet about withdrawal symptoms.  He had been off antidepressants for 2 months.  And felt more anxious and depressed.  We discussed it and decided to return to duloxetine and increase to 60 mg daily.  visit in July after being back on the medication of duloxetine 60 mg daily he reported the following: It's great.  No depression but some anxiety that is managed.  He's 9/10 and satisfied.  Pleased with the dosage..  Tolerated well.  Sleep is good.  Better self care and better eating.  Working really hard but business is good.  In Holiday representative.  Location manager.  Energy better. Good appetite.  For years has had thoughts of if got terminal disease it would be easier and they are worse.  Hopelessness resolved. He had wanted to consider tapering off gabapentin and was given permission to do that if he chose to do so. He was continued on lamotrigine 150 mg daily.  visit October 16 2019.   Don't feel depressed and kind of happy but lingering thoughts of "is this as good as it gets?".  Not consuming him.  Not much excitement about things except work.  Doing design work for Teacher, early years/pre.  Some anxiety about being around people. Not DT Covid unless it made it worse.   Prefer to be alone.  Did a lot of theater in the past but not excited like he used to be.  Likes yard work.  Not interested in dating.  Wants to talk about Gabapentin for pain.  Previously thought it helped.  Wants to try to stop it and see if he can do without it.  Knees don't hurt bc replaced. Plan:  Cont duloxetine 60 and add Abilify 5 Taper gabapentin off  12/25/19 appt, the following is noted: Anxiety not good.  Weary of dealing with it.  Had trouble dealing with number of people on the beach this weekend DT anxiety.  At times would like a BZ.  Always felt anxious but not managing it well.  Isolating. No SE gabapentin.  Did not stop it bc he forgot it. Plan: Increase gabapentin to 300 BID and 600 HS for 2 days, then incrase to 2 in AM  And HS and 1 in middle of the day for 4 days, then increase to 600 TID.  For a week, if NR then increase to 900  TID.    02/08/2020 appointment with the following noted: Increased gabapentin to 600 mg BID.  Has to nap if takes more.  No less anxious with the increase in dose.  No benefit to it. Asks about BZ.   Doesn't really want to leave the house and anxiety driving.  Can get anxious even in yard bc afraid of having to talk to people.  No paranoia. Dread of things.  Emotional eating. Plan: Switch to paroxetine 30 mg for better antianxiety effect and hopefully help depression a little more.  Start one half of paroxetine 20 mg tablet and reduce duloxetine to 2 of the 20 mg capsules for 5 days, Then increase paroxetine to 1 tablet daily and reduce duloxetine to 1 capsule daily for 5 days, Then increase to 1-1/2 paroxetine tablets daily and stop duloxetine Continue Abilify 5 mg augmentation for residual anhedonia.   03/08/2020 phone call patient stating he felt Paxil was causing tremors in hands and feet and not helping with anxiety.  He was instructed to reduce paroxetine to 20 mg daily and stop Abilify for 3 days and then restart Abilify 1/2 tablet. 03/20/2020  phone call patient stating the above changes made no difference in tremor.  He was instructed to stop Abilify and increase paroxetine to 40 mg daily. 04/01/2020 patient called stating his PCP wanted him to stop taking paroxetine completely to see if the tremors would resolve. 05/01/2020 patient phone call stating he was diagnosed with Parkinson's disease but because of anxiety needed to be on some medication he was encouraged to restart the paroxetine which had never had an adequate trial for his anxiety. 06/03/2020 phone call from patient complaining of panic attacks.  He had increase paroxetine to 40 mg a day and was given hydroxyzine as needed.  06/07/2020 urgent appointment with the following noted: Not well.  Depression as bad as ever and anxiety through the roof.  Worse beginning of the week bc feels completely overwhelming.   Getting OOB, brushing teeth is hard, being in public hard. Social avoidance affecting work.  Don't feel scared of PD but slap in the face about age.  Single and in middle of 5 year bankruptcy.  Trouble with concentration. Not suicidal but death thoughts. Yesterday a good day and laughed first time in a while. Poor appetite and lost weight. On paroxetine 40 mg for 3 weeks. No SE.   Trouble staying asleep. 4-5  Hours.  Doing AA by internet.  Sober since 2006.  No history of BZ dependence. Asked about BZ for severe anxiety.  Past Psychiatric Medication Trials:  Brief duloxetine, sertraline 200, paroxetine 40, buspirone dizzy, gabapentin,  Abilify helped the depression and sense of dread but nothing for anxiety.  Review of Systems:   Review of Systems  Constitutional: Positive for unexpected weight change. Negative for fatigue.  Gastrointestinal: Negative for abdominal distention.  Musculoskeletal: Negative for arthralgias.  Neurological: Positive for tremors. Negative for weakness.  Psychiatric/Behavioral: Positive for dysphoric mood. Negative for agitation, behavioral  problems, confusion, decreased concentration, hallucinations, self-injury, sleep disturbance and suicidal ideas. The patient is nervous/anxious. The patient is not hyperactive.     Medications: I have reviewed the patient's current medications.  Current Outpatient Medications  Medication Sig Dispense Refill  . aspirin EC 81 MG tablet Take 1 tablet (81 mg total) by mouth 2 (two) times daily. (Patient taking differently: Take 81 mg by mouth daily. ) 60 tablet 0  . carbidopa-levodopa (SINEMET IR) 25-100 MG tablet Take 1 tablet by mouth 3 (three) times daily before meals. 90 tablet 6  . gabapentin (NEURONTIN) 300 MG capsule TAKE ONE CAPSULE BY MOUTH EVERY MORNING AND TAKE TWO CAPSULES BY MOUTH EVERY NIGHT AT BEDTIME 270 capsule 0  . hydrOXYzine (ATARAX/VISTARIL) 10 MG tablet Take 1 tablet (10 mg total) by mouth 3 (three) times daily as needed. 30 tablet 0  . lamoTRIgine (LAMICTAL) 150 MG tablet  TAKE ONE TABLET BY MOUTH AT BEDTIME 90 tablet 0  . lisinopril-hydrochlorothiazide (PRINZIDE,ZESTORETIC) 20-12.5 MG per tablet Take 1 tablet by mouth 2 (two) times daily.     . meloxicam (MOBIC) 15 MG tablet Take 15 mg by mouth daily.    . methocarbamol (ROBAXIN) 500 MG tablet Take 500 mg by mouth 3 (three) times daily.    . Multiple Vitamins-Minerals (MULTIVITAMIN WITH MINERALS) tablet Take 1 tablet by mouth daily.    Marland Kitchen omeprazole (PRILOSEC) 20 MG capsule Take 20 mg by mouth 2 (two) times daily.    Marland Kitchen PARoxetine (PAXIL) 40 MG tablet TAKE ONE TABLET BY MOUTH DAILY 30 tablet 1  . propranolol (INDERAL) 40 MG tablet Take 40 mg by mouth 2 (two) times daily.    . simvastatin (ZOCOR) 40 MG tablet Take 40 mg by mouth daily.     . hydrOXYzine (ATARAX/VISTARIL) 25 MG tablet 25 mg TID prn anxiety  And 1-2 hs prn insomnia. 100 tablet 1  . LORazepam (ATIVAN) 1 MG tablet 1 tablet prn panic attacks. Limit to 3 per week 12 tablet 0   No current facility-administered medications for this visit.    Medication Side Effects:  None  Allergies: No Known Allergies  Past Medical History:  Diagnosis Date  . Alcoholism (HCC)    sober since 01-2011   . Anxiety   . Arthritis   . Balance problem   . Complication of anesthesia    unsuccessful spinal with right knee replacement, used general anesthesia  . Depression   . Difficulty concentrating   . Diverticulosis 2012  . GERD (gastroesophageal reflux disease)   . Hemorrhoids   . Hyperlipidemia   . Hypertension   . Internal hemorrhoids 2012  . Obsessive compulsive disorder   . Osteoarthritis   . Rotator cuff tear, left   . Tear of right rotator cuff 08/03/2014  . Wears contact lenses     Family History  Problem Relation Age of Onset  . Stroke Mother   . Diabetes Mother   . Cancer Father   . Parkinson's disease Maternal Grandmother   . Colon cancer Neg Hx     Social History   Socioeconomic History  . Marital status: Divorced    Spouse name: Not on file  . Number of children: 3  . Years of education: Not on file  . Highest education level: Bachelor's degree (e.g., BA, AB, BS)  Occupational History  . Occupation: Tree surgeon  Tobacco Use  . Smoking status: Former Smoker    Types: E-cigarettes    Quit date: 08/01/2009    Years since quitting: 10.8  . Smokeless tobacco: Never Used  Vaping Use  . Vaping Use: Every day  . Start date: 08/12/2014  Substance and Sexual Activity  . Alcohol use: No    Comment: not since 2006  . Drug use: No    Comment: qut 1996  . Sexual activity: Not on file    Comment: uses e-cig  Other Topics Concern  . Not on file  Social History Narrative   Lives alone   No caffeine   Social Determinants of Health   Financial Resource Strain:   . Difficulty of Paying Living Expenses: Not on file  Food Insecurity:   . Worried About Programme researcher, broadcasting/film/video in the Last Year: Not on file  . Ran Out of Food in the Last Year: Not on file  Transportation Needs:   . Lack of Transportation (Medical): Not on file  . Lack  of Transportation (Non-Medical): Not on file  Physical Activity:   . Days of Exercise per Week: Not on file  . Minutes of Exercise per Session: Not on file  Stress:   . Feeling of Stress : Not on file  Social Connections:   . Frequency of Communication with Friends and Family: Not on file  . Frequency of Social Gatherings with Friends and Family: Not on file  . Attends Religious Services: Not on file  . Active Member of Clubs or Organizations: Not on file  . Attends Banker Meetings: Not on file  . Marital Status: Not on file  Intimate Partner Violence:   . Fear of Current or Ex-Partner: Not on file  . Emotionally Abused: Not on file  . Physically Abused: Not on file  . Sexually Abused: Not on file    Past Medical History, Surgical history, Social history, and Family history were reviewed and updated as appropriate.   Please see review of systems for further details on the patient's review from today.   Objective:   Physical Exam:  There were no vitals taken for this visit.  Physical Exam Constitutional:      General: He is not in acute distress. Musculoskeletal:        General: No deformity.  Neurological:     Mental Status: He is alert and oriented to person, place, and time.     Cranial Nerves: No dysarthria.     Coordination: Coordination normal.  Psychiatric:        Attention and Perception: Attention and perception normal. He does not perceive auditory or visual hallucinations.        Mood and Affect: Mood is anxious and depressed. Affect is not labile, blunt, angry or inappropriate.        Speech: Speech normal.        Behavior: Behavior normal. Behavior is cooperative.        Thought Content: Thought content normal. Thought content is not paranoid or delusional. Thought content does not include homicidal or suicidal ideation. Thought content does not include homicidal or suicidal plan.        Cognition and Memory: Cognition and memory normal.         Judgment: Judgment normal.     Comments: Talkative. More distressed than ever.       Lab Review:     Component Value Date/Time   NA 136 08/02/2018 0458   K 5.0 08/02/2018 0458   CL 102 08/02/2018 0458   CO2 27 08/02/2018 0458   GLUCOSE 138 (H) 08/02/2018 0458   BUN 26 (H) 08/02/2018 0458   CREATININE 1.10 08/02/2018 0458   CALCIUM 9.0 08/02/2018 0458   PROT 7.6 08/12/2017 1445   ALBUMIN 4.3 08/12/2017 1445   AST 29 08/12/2017 1445   ALT 26 08/12/2017 1445   ALKPHOS 60 08/12/2017 1445   BILITOT 1.0 08/12/2017 1445   GFRNONAA >60 08/02/2018 0458   GFRAA >60 08/02/2018 0458       Component Value Date/Time   WBC 14.6 (H) 08/02/2018 0458   RBC 4.06 (L) 08/02/2018 0458   HGB 12.9 (L) 08/02/2018 0458   HCT 37.8 (L) 08/02/2018 0458   PLT 205 08/02/2018 0458   MCV 93.1 08/02/2018 0458   MCH 31.8 08/02/2018 0458   MCHC 34.1 08/02/2018 0458   RDW 12.9 08/02/2018 0458   LYMPHSABS 1.4 07/26/2018 0954   MONOABS 0.6 07/26/2018 0954   EOSABS 0.2 07/26/2018 0954   BASOSABS 0.0 07/26/2018 0954  No results found for: POCLITH, LITHIUM   No results found for: PHENYTOIN, PHENOBARB, VALPROATE, CBMZ   .res Assessment: Plan:    Abyan was seen today for follow-up, depression and anxiety.  Diagnoses and all orders for this visit:  Panic disorder with agoraphobia -     hydrOXYzine (ATARAX/VISTARIL) 25 MG tablet; 25 mg TID prn anxiety  And 1-2 hs prn insomnia. -     LORazepam (ATIVAN) 1 MG tablet; 1 tablet prn panic attacks. Limit to 3 per week  Major depressive disorder, recurrent episode, moderate (HCC)  GAD (generalized anxiety disorder) -     hydrOXYzine (ATARAX/VISTARIL) 25 MG tablet; 25 mg TID prn anxiety  And 1-2 hs prn insomnia.  PTSD (post-traumatic stress disorder) -     hydrOXYzine (ATARAX/VISTARIL) 25 MG tablet; 25 mg TID prn anxiety  And 1-2 hs prn insomnia.  Social anxiety disorder  Alcohol dependence, in remission [facility])   Prior visit Patient stated he has  been on antidepressants very long time and wondered what his emotional state would be like off the medication.  This was partly influenced by online patient for him to described problems with withdrawal coming off of SSRIs.  He successfully came off antidepressant without withdrawal symptoms.  But then has had relapse of both depression and anxiety within a few weeks off the antidepressant  Consider switch paroxetine bc best antianxiety.    Needs more time to respond to paroxetine 40 mg daily Yes he agrees.  Reevaluate after paroxetine trial.  Disc DDI with paroxetine.  Consider lithium, mirtazapine, Remeron, trazodone, Deplin  Deplin 15 mg daily for depression  With samples.  Ativan 1 mg 3 times weekly or less.  Disc risk triggering alcohol abuse.  He wants it bc feels desperate.  We discussed the short-term risks associated with benzodiazepines including sedation and increased fall risk among others.  Discussed long-term side effect risk including dependence, potential withdrawal symptoms, and the potential eventual dose-related risk of dementia.  But recent studies from 2020 dispute this association between benzodiazepines and dementia risk. Newer studies in 2020 do not support an association with dementia.  Increase hydroxyzine 25 mg TID prn anxiety  And 1-2 hs prn insomnia.  Discussed his chronic thoughts about death. They resolved so far.  He commits to safety and is not having suicidal thoughts.  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   Disc BZ.   Extensive discussion of how Covid has allowed social anxiety to get worse but is functional.   Option retry lower dose of Buspirone.   Support continued sobriety.  Continue AA.    Follow-up 4 weeks  Meredith Staggers, MD, DFAPA     Please see After Visit Summary for patient specific instructions.  Future Appointments  Date Time Provider Department Center  07/10/2020 10:00 AM Cottle, Steva Ready., MD [facility] None  11/05/2020  11:30 AM Penumalli, Glenford Bayley, MD [facility] None    No orders of the defined types were placed in this encounter.     -------------------------------

## 2020-07-10 ENCOUNTER — Encounter: Payer: Self-pay | Admitting: Psychiatry

## 2020-07-10 ENCOUNTER — Ambulatory Visit (INDEPENDENT_AMBULATORY_CARE_PROVIDER_SITE_OTHER): Payer: 59 | Admitting: Psychiatry

## 2020-07-10 ENCOUNTER — Other Ambulatory Visit: Payer: Self-pay

## 2020-07-10 DIAGNOSIS — F4001 Agoraphobia with panic disorder: Secondary | ICD-10-CM

## 2020-07-10 DIAGNOSIS — F332 Major depressive disorder, recurrent severe without psychotic features: Secondary | ICD-10-CM | POA: Diagnosis not present

## 2020-07-10 DIAGNOSIS — F4310 Post-traumatic stress disorder, unspecified: Secondary | ICD-10-CM

## 2020-07-10 DIAGNOSIS — F1021 Alcohol dependence, in remission: Secondary | ICD-10-CM

## 2020-07-10 DIAGNOSIS — F411 Generalized anxiety disorder: Secondary | ICD-10-CM

## 2020-07-10 DIAGNOSIS — F331 Major depressive disorder, recurrent, moderate: Secondary | ICD-10-CM

## 2020-07-10 DIAGNOSIS — F4010 Social phobia, unspecified: Secondary | ICD-10-CM

## 2020-07-10 MED ORDER — PAROXETINE HCL 30 MG PO TABS: 60.0000 mg | ORAL_TABLET | Freq: Every day | ORAL | 1 refills | Status: DC

## 2020-07-10 MED ORDER — LORAZEPAM 1 MG PO TABS: ORAL_TABLET | ORAL | 0 refills | Status: DC

## 2020-07-10 NOTE — Progress Notes (Signed)
Adam Harvey [id number] 1961-05-15 59 y.o.    Subjective:   Patient ID:  Adam Harvey is a 59 y.o. (DOB June 12, 1961) male.  Chief Complaint:  Chief Complaint  Patient presents with  . Follow-up  . Anxiety  . Depression    Anxiety Symptoms include nervous/anxious behavior. Patient reports no chest pain, confusion, decreased concentration, palpitations or suicidal ideas.    Medication Refill Pertinent negatives include no arthralgias, chest pain, fatigue or weakness.   Adam Harvey presents to the office today for follow-up of several diagnoses and wanted an earlier appointment because he wanted to restart duloxetine.  visit January 16, 2019.  He had wanted a trial off of antidepressants in part because of reading on the Internet about withdrawal symptoms.  He had been off antidepressants for 2 months.  And felt more anxious and depressed.  We discussed it and decided to return to duloxetine and increase to 60 mg daily.  visit in July after being back on the medication of duloxetine 60 mg daily he reported the following: It's great.  No depression but some anxiety that is managed.  He's 9/10 and satisfied.  Pleased with the dosage..  Tolerated well.  Sleep is good.  Better self care and better eating.  Working really hard but business is good.  In Holiday representative.  Location manager.  Energy better. Good appetite.  For years has had thoughts of if got terminal disease it would be easier and they are worse.  Hopelessness resolved. He had wanted to consider tapering off gabapentin and was given permission to do that if he chose to do so. He was continued on lamotrigine 150 mg daily.  visit October 16 2019.   Don't feel depressed and kind of happy but lingering thoughts of "is this as good as it gets?".  Not consuming him.  Not much excitement about things except work.  Doing design work for Teacher, early years/pre.  Some anxiety about being around people. Not DT Covid unless it made it worse.  Prefer to be  alone.  Did a lot of theater in the past but not excited like he used to be.  Likes yard work.  Not interested in dating.  Wants to talk about Gabapentin for pain.  Previously thought it helped.  Wants to try to stop it and see if he can do without it.  Knees don't hurt bc replaced. Plan:  Cont duloxetine 60 and add Abilify 5 Taper gabapentin off  12/25/19 appt, the following is noted: Anxiety not good.  Weary of dealing with it.  Had trouble dealing with number of people on the beach this weekend DT anxiety.  At times would like a BZ.  Always felt anxious but not managing it well.  Isolating. No SE gabapentin.  Did not stop it bc he forgot it. Plan: Increase gabapentin to 300 BID and 600 HS for 2 days, then incrase to 2 in AM  And HS and 1 in middle of the day for 4 days, then increase to 600 TID.  For a week, if NR then increase to 900  TID.    02/08/2020 appointment with the following noted: Increased gabapentin to 600 mg BID.  Has to nap if takes more.  No less anxious with the increase in dose.  No benefit to it. Asks about BZ.   Doesn't really want to leave the house and anxiety driving.  Can get anxious even in yard bc afraid of having to talk to people.  No paranoia. Dread of  things.  Emotional eating. Plan: Switch to paroxetine 30 mg for better antianxiety effect and hopefully help depression a little more.  Start one half of paroxetine 20 mg tablet and reduce duloxetine to 2 of the 20 mg capsules for 5 days, Then increase paroxetine to 1 tablet daily and reduce duloxetine to 1 capsule daily for 5 days, Then increase to 1-1/2 paroxetine tablets daily and stop duloxetine Continue Abilify 5 mg augmentation for residual anhedonia.   03/08/2020 phone call patient stating he felt Paxil was causing tremors in hands and feet and not helping with anxiety.  He was instructed to reduce paroxetine to 20 mg daily and stop Abilify for 3 days and then restart Abilify 1/2 tablet. 03/20/2020 phone call  patient stating the above changes made no difference in tremor.  He was instructed to stop Abilify and increase paroxetine to 40 mg daily. 04/01/2020 patient called stating his PCP wanted him to stop taking paroxetine completely to see if the tremors would resolve. 05/01/2020 patient phone call stating he was diagnosed with Parkinson's disease but because of anxiety needed to be on some medication he was encouraged to restart the paroxetine which had never had an adequate trial for his anxiety. 06/03/2020 phone call from patient complaining of panic attacks.  He had increase paroxetine to 40 mg a day and was given hydroxyzine as needed.  06/07/2020 urgent appointment with the following noted: Not well.  Depression as bad as ever and anxiety through the roof.  Worse beginning of the week bc feels completely overwhelming.   Getting OOB, brushing teeth is hard, being in public hard. Social avoidance affecting work.  Don't feel scared of PD but slap in the face about age.  Single and in middle of 5 year bankruptcy.  Trouble with concentration. Not suicidal but death thoughts. Yesterday a good day and laughed first time in a while. Poor appetite and lost weight. On paroxetine 40 mg for 3 weeks. No SE.   Trouble staying asleep. 4-5  Hours. Plan: Needs more time to respond to paroxetine 40 mg daily Deplin 15 mg daily for depression  With samples. Increase hydroxyzine for ST anxiety.  07/10/2020 appointment with the following noted: Depression is better but anxiety is through the roof.  Can laugh again.  Less hopeless and paralyzed by depression.  Now 5/10 depression.  Anxiety is still 10/10.  Will have nausea going out.  Has managed the anxiety pretty well.  Wonders if PD is causing anxiety.  Over the last 2-3 years more social anxiety.   No SE with Paxil. Hydroxyzine TID and 2 HS.  Lorazepam helps but manages it.   Doing AA by internet.  Sober since 2006.  No history of BZ dependence. Asked about BZ for  severe anxiety.  Past Psychiatric Medication Trials:  Brief duloxetine, sertraline 200, paroxetine 40, buspirone dizzy, gabapentin,  Abilify helped the depression and sense of dread but nothing for anxiety.  Review of Systems:   Review of Systems  Constitutional: Positive for unexpected weight change. Negative for fatigue.  Cardiovascular: Negative for chest pain and palpitations.  Gastrointestinal: Negative for abdominal distention.  Musculoskeletal: Negative for arthralgias.  Neurological: Positive for tremors. Negative for weakness.  Psychiatric/Behavioral: Positive for dysphoric mood. Negative for agitation, behavioral problems, confusion, decreased concentration, hallucinations, self-injury, sleep disturbance and suicidal ideas. The patient is nervous/anxious. The patient is not hyperactive.     Medications: I have reviewed the patient's current medications.  Current Outpatient Medications  Medication Sig Dispense Refill  .  aspirin EC 81 MG tablet Take 1 tablet (81 mg total) by mouth 2 (two) times daily. (Patient taking differently: Take 81 mg by mouth daily. ) 60 tablet 0  . carbidopa-levodopa (SINEMET IR) 25-100 MG tablet Take 1 tablet by mouth 3 (three) times daily before meals. 90 tablet 6  . hydrOXYzine (ATARAX/VISTARIL) 10 MG tablet Take 1 tablet (10 mg total) by mouth 3 (three) times daily as needed. 30 tablet 0  . hydrOXYzine (ATARAX/VISTARIL) 25 MG tablet 25 mg TID prn anxiety  And 1-2 hs prn insomnia. 100 tablet 1  . lamoTRIgine (LAMICTAL) 150 MG tablet TAKE ONE TABLET BY MOUTH AT BEDTIME 90 tablet 0  . lisinopril-hydrochlorothiazide (PRINZIDE,ZESTORETIC) 20-12.5 MG per tablet Take 1 tablet by mouth 2 (two) times daily.     Marland Kitchen LORazepam (ATIVAN) 1 MG tablet 1 tablet prn panic attacks. Limit to 3 per week 12 tablet 0  . meloxicam (MOBIC) 15 MG tablet Take 15 mg by mouth daily.    . methocarbamol (ROBAXIN) 500 MG tablet Take 500 mg by mouth 3 (three) times daily.    .  Multiple Vitamins-Minerals (MULTIVITAMIN WITH MINERALS) tablet Take 1 tablet by mouth daily.    Marland Kitchen omeprazole (PRILOSEC) 20 MG capsule Take 20 mg by mouth 2 (two) times daily.    Marland Kitchen PARoxetine (PAXIL) 30 MG tablet Take 2 tablets (60 mg total) by mouth daily. TAKE ONE TABLET BY MOUTH DAILY 60 tablet 1  . propranolol (INDERAL) 40 MG tablet Take 40 mg by mouth 2 (two) times daily.    . simvastatin (ZOCOR) 40 MG tablet Take 40 mg by mouth daily.      No current facility-administered medications for this visit.    Medication Side Effects: None  Allergies: No Known Allergies  Past Medical History:  Diagnosis Date  . Alcoholism (HCC)    sober since 01-2011   . Anxiety   . Arthritis   . Balance problem   . Complication of anesthesia    unsuccessful spinal with right knee replacement, used general anesthesia  . Depression   . Difficulty concentrating   . Diverticulosis 2012  . GERD (gastroesophageal reflux disease)   . Hemorrhoids   . Hyperlipidemia   . Hypertension   . Internal hemorrhoids 2012  . Obsessive compulsive disorder   . Osteoarthritis   . Rotator cuff tear, left   . Tear of right rotator cuff 08/03/2014  . Wears contact lenses     Family History  Problem Relation Age of Onset  . Stroke Mother   . Diabetes Mother   . Cancer Father   . Parkinson's disease Maternal Grandmother   . Colon cancer Neg Hx     Social History   Socioeconomic History  . Marital status: Divorced    Spouse name: Not on file  . Number of children: 3  . Years of education: Not on file  . Highest education level: Bachelor's degree (e.g., BA, AB, BS)  Occupational History  . Occupation: Tree surgeon  Tobacco Use  . Smoking status: Former Smoker    Types: E-cigarettes    Quit date: 08/01/2009    Years since quitting: 10.9  . Smokeless tobacco: Never Used  Vaping Use  . Vaping Use: Every day  . Start date: 08/12/2014  Substance and Sexual Activity  . Alcohol use: No    Comment: not  since 2006  . Drug use: No    Comment: qut 1996  . Sexual activity: Not on file    Comment:  uses e-cig  Other Topics Concern  . Not on file  Social History Narrative   Lives alone   No caffeine   Social Determinants of Health   Financial Resource Strain:   . Difficulty of Paying Living Expenses: Not on file  Food Insecurity:   . Worried About Programme researcher, broadcasting/film/video in the Last Year: Not on file  . Ran Out of Food in the Last Year: Not on file  Transportation Needs:   . Lack of Transportation (Medical): Not on file  . Lack of Transportation (Non-Medical): Not on file  Physical Activity:   . Days of Exercise per Week: Not on file  . Minutes of Exercise per Session: Not on file  Stress:   . Feeling of Stress : Not on file  Social Connections:   . Frequency of Communication with Friends and Family: Not on file  . Frequency of Social Gatherings with Friends and Family: Not on file  . Attends Religious Services: Not on file  . Active Member of Clubs or Organizations: Not on file  . Attends Banker Meetings: Not on file  . Marital Status: Not on file  Intimate Partner Violence:   . Fear of Current or Ex-Partner: Not on file  . Emotionally Abused: Not on file  . Physically Abused: Not on file  . Sexually Abused: Not on file    Past Medical History, Surgical history, Social history, and Family history were reviewed and updated as appropriate.   Please see review of systems for further details on the patient's review from today.   Objective:   Physical Exam:  There were no vitals taken for this visit.  Physical Exam Constitutional:      General: He is not in acute distress. Musculoskeletal:        General: No deformity.  Neurological:     Mental Status: He is alert and oriented to person, place, and time.     Cranial Nerves: No dysarthria.     Coordination: Coordination normal.  Psychiatric:        Attention and Perception: Attention and perception normal.  He does not perceive auditory or visual hallucinations.        Mood and Affect: Mood is anxious and depressed. Affect is not labile, blunt, angry or inappropriate.        Speech: Speech normal.        Behavior: Behavior normal. Behavior is cooperative.        Thought Content: Thought content normal. Thought content is not paranoid or delusional. Thought content does not include homicidal or suicidal ideation. Thought content does not include homicidal or suicidal plan.        Cognition and Memory: Cognition and memory normal.        Judgment: Judgment normal.     Comments: Talkative. Better than last session     Lab Review:     Component Value Date/Time   NA 136 08/02/2018 0458   K 5.0 08/02/2018 0458   CL 102 08/02/2018 0458   CO2 27 08/02/2018 0458   GLUCOSE 138 (H) 08/02/2018 0458   BUN 26 (H) 08/02/2018 0458   CREATININE 1.10 08/02/2018 0458   CALCIUM 9.0 08/02/2018 0458   PROT 7.6 08/12/2017 1445   ALBUMIN 4.3 08/12/2017 1445   AST 29 08/12/2017 1445   ALT 26 08/12/2017 1445   ALKPHOS 60 08/12/2017 1445   BILITOT 1.0 08/12/2017 1445   GFRNONAA >60 08/02/2018 0458   GFRAA >60 08/02/2018 0458  Component Value Date/Time   WBC 14.6 (H) 08/02/2018 0458   RBC 4.06 (L) 08/02/2018 0458   HGB 12.9 (L) 08/02/2018 0458   HCT 37.8 (L) 08/02/2018 0458   PLT 205 08/02/2018 0458   MCV 93.1 08/02/2018 0458   MCH 31.8 08/02/2018 0458   MCHC 34.1 08/02/2018 0458   RDW 12.9 08/02/2018 0458   LYMPHSABS 1.4 07/26/2018 0954   MONOABS 0.6 07/26/2018 0954   EOSABS 0.2 07/26/2018 0954   BASOSABS 0.0 07/26/2018 0954    No results found for: POCLITH, LITHIUM   No results found for: PHENYTOIN, PHENOBARB, VALPROATE, CBMZ   .res Assessment: Plan:    Adam Harvey was seen today for follow-up, anxiety and depression.  Diagnoses and all orders for this visit:  Moderately severe recurrent major depression (HCC)  Panic disorder with agoraphobia -     LORazepam (ATIVAN) 1 MG tablet; 1  tablet prn panic attacks. Limit to 3 per week  GAD (generalized anxiety disorder) -     PARoxetine (PAXIL) 30 MG tablet; Take 2 tablets (60 mg total) by mouth daily. TAKE ONE TABLET BY MOUTH DAILY  PTSD (post-traumatic stress disorder)  Social anxiety disorder  Alcohol dependence, in remission (HCC)  Major depressive disorder, recurrent episode, moderate (HCC) -     PARoxetine (PAXIL) 30 MG tablet; Take 2 tablets (60 mg total) by mouth daily. TAKE ONE TABLET BY MOUTH DAILY   Prior visit Patient stated he has been on antidepressants very long time and wondered what his emotional state would be like off the medication.  This was partly influenced by online patient for him to described problems with withdrawal coming off of SSRIs.  He successfully came off antidepressant without withdrawal symptoms.  But then has had relapse of both depression and anxiety within a few weeks off the antidepressant  Increase paroxetine 60 mg daily.  It helped depression but not anxiety so far. Yes he agrees.  Reevaluate after paroxetine trial.  Disc DDI with paroxetine.  Consider lithium, mirtazapine, Remeron, trazodone, Deplin  Deplin 15 mg daily for depression  With samples.  Ativan 1 mg 3 times weekly or less.  Disc risk triggering alcohol abuse.  He wants it bc feels desperate.  We discussed the short-term risks associated with benzodiazepines including sedation and increased fall risk among others.  Discussed long-term side effect risk including dependence, potential withdrawal symptoms, and the potential eventual dose-related risk of dementia.  But recent studies from 2020 dispute this association between benzodiazepines and dementia risk. Newer studies in 2020 do not support an association with dementia.  Increase hydroxyzine 25 mg TID prn anxiety  And 1-2 hs prn insomnia. Continue gabapentin.    Discussed his chronic thoughts about death. They resolved so far.  He commits to safety and is not having  suicidal thoughts.  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   Disc BZ.   Extensive discussion of how Covid has allowed social anxiety to get worse but is functional.   Option retry lower dose of Buspirone.   Support continued sobriety.  Continue AA.    Follow-up 4 weeks  Meredith Staggersarey Cottle, MD, DFAPA     Please see After Visit Summary for patient specific instructions.  Future Appointments  Date Time Provider Department Center  08/06/2020  1:00 PM Cottle, Steva Readyarey G Jr., MD [facility] None  11/05/2020 11:30 AM Penumalli, Glenford BayleyVikram R, MD [facility] None    No orders of the defined types were placed in this encounter.     -------------------------------

## 2020-07-17 ENCOUNTER — Other Ambulatory Visit: Payer: Self-pay | Admitting: Psychiatry

## 2020-07-17 DIAGNOSIS — F4001 Agoraphobia with panic disorder: Secondary | ICD-10-CM

## 2020-07-17 DIAGNOSIS — F411 Generalized anxiety disorder: Secondary | ICD-10-CM

## 2020-07-17 DIAGNOSIS — F4310 Post-traumatic stress disorder, unspecified: Secondary | ICD-10-CM

## 2020-08-06 ENCOUNTER — Encounter: Payer: Self-pay | Admitting: Psychiatry

## 2020-08-06 ENCOUNTER — Ambulatory Visit (INDEPENDENT_AMBULATORY_CARE_PROVIDER_SITE_OTHER): Payer: 59 | Admitting: Psychiatry

## 2020-08-06 ENCOUNTER — Other Ambulatory Visit: Payer: Self-pay

## 2020-08-06 DIAGNOSIS — F4001 Agoraphobia with panic disorder: Secondary | ICD-10-CM | POA: Diagnosis not present

## 2020-08-06 DIAGNOSIS — F332 Major depressive disorder, recurrent severe without psychotic features: Secondary | ICD-10-CM

## 2020-08-06 DIAGNOSIS — F411 Generalized anxiety disorder: Secondary | ICD-10-CM | POA: Diagnosis not present

## 2020-08-06 DIAGNOSIS — F1021 Alcohol dependence, in remission: Secondary | ICD-10-CM

## 2020-08-06 DIAGNOSIS — F4010 Social phobia, unspecified: Secondary | ICD-10-CM | POA: Diagnosis not present

## 2020-08-06 DIAGNOSIS — F4310 Post-traumatic stress disorder, unspecified: Secondary | ICD-10-CM

## 2020-08-06 MED ORDER — LORAZEPAM 1 MG PO TABS: ORAL_TABLET | ORAL | 0 refills | Status: DC

## 2020-08-06 NOTE — Progress Notes (Signed)
Adam Harvey [id number] 01/06/1961 59 y.o.    Subjective:   Patient ID:  Adam Harvey is a 59 y.o. (DOB 04/04/1961) male.  Chief Complaint:  Chief Complaint  Patient presents with  . Follow-up  . Depression  . Anxiety    Anxiety Symptoms include nervous/anxious behavior. Patient reports no chest pain, confusion, decreased concentration, palpitations, shortness of breath or suicidal ideas.    Medication Refill Pertinent negatives include no arthralgias, chest pain, fatigue or weakness.   Brace Rosal presents to the office today for follow-up of several diagnoses and wanted an earlier appointment because he wanted to restart duloxetine.  visit January 16, 2019.  He had wanted a trial off of antidepressants in part because of reading on the Internet about withdrawal symptoms.  He had been off antidepressants for 2 months.  And felt more anxious and depressed.  We discussed it and decided to return to duloxetine and increase to 60 mg daily.  visit in July after being back on the medication of duloxetine 60 mg daily he reported the following: It's great.  No depression but some anxiety that is managed.  He's 9/10 and satisfied.  Pleased with the dosage..  Tolerated well.  Sleep is good.  Better self care and better eating.  Working really hard but business is good.  In Holiday representative.  Location manager.  Energy better. Good appetite.  For years has had thoughts of if got terminal disease it would be easier and they are worse.  Hopelessness resolved. He had wanted to consider tapering off gabapentin and was given permission to do that if he chose to do so. He was continued on lamotrigine 150 mg daily.  visit October 16 2019.   Don't feel depressed and kind of happy but lingering thoughts of "is this as good as it gets?".  Not consuming him.  Not much excitement about things except work.  Doing design work for Teacher, early years/pre.  Some anxiety about being around people. Not DT Covid unless it made it  worse.  Prefer to be alone.  Did a lot of theater in the past but not excited like he used to be.  Likes yard work.  Not interested in dating.  Wants to talk about Gabapentin for pain.  Previously thought it helped.  Wants to try to stop it and see if he can do without it.  Knees don't hurt bc replaced. Plan:  Cont duloxetine 60 and add Abilify 5 Taper gabapentin off  12/25/19 appt, the following is noted: Anxiety not good.  Weary of dealing with it.  Had trouble dealing with number of people on the beach this weekend DT anxiety.  At times would like a BZ.  Always felt anxious but not managing it well.  Isolating. No SE gabapentin.  Did not stop it bc he forgot it. Plan: Increase gabapentin to 300 BID and 600 HS for 2 days, then incrase to 2 in AM  And HS and 1 in middle of the day for 4 days, then increase to 600 TID.  For a week, if NR then increase to 900  TID.    02/08/2020 appointment with the following noted: Increased gabapentin to 600 mg BID.  Has to nap if takes more.  No less anxious with the increase in dose.  No benefit to it. Asks about BZ.   Doesn't really want to leave the house and anxiety driving.  Can get anxious even in yard bc afraid of having to talk to people.  No  paranoia. Dread of things.  Emotional eating. Plan: Switch to paroxetine 30 mg for better antianxiety effect and hopefully help depression a little more.  Start one half of paroxetine 20 mg tablet and reduce duloxetine to 2 of the 20 mg capsules for 5 days, Then increase paroxetine to 1 tablet daily and reduce duloxetine to 1 capsule daily for 5 days, Then increase to 1-1/2 paroxetine tablets daily and stop duloxetine Continue Abilify 5 mg augmentation for residual anhedonia.   03/08/2020 phone call patient stating he felt Paxil was causing tremors in hands and feet and not helping with anxiety.  He was instructed to reduce paroxetine to 20 mg daily and stop Abilify for 3 days and then restart Abilify 1/2  tablet. 03/20/2020 phone call patient stating the above changes made no difference in tremor.  He was instructed to stop Abilify and increase paroxetine to 40 mg daily. 04/01/2020 patient called stating his PCP wanted him to stop taking paroxetine completely to see if the tremors would resolve. 05/01/2020 patient phone call stating he was diagnosed with Parkinson's disease but because of anxiety needed to be on some medication he was encouraged to restart the paroxetine which had never had an adequate trial for his anxiety. 06/03/2020 phone call from patient complaining of panic attacks.  He had increase paroxetine to 40 mg a day and was given hydroxyzine as needed.  06/07/2020 urgent appointment with the following noted: Not well.  Depression as bad as ever and anxiety through the roof.  Worse beginning of the week bc feels completely overwhelming.   Getting OOB, brushing teeth is hard, being in public hard. Social avoidance affecting work.  Don't feel scared of PD but slap in the face about age.  Single and in middle of 5 year bankruptcy.  Trouble with concentration. Not suicidal but death thoughts. Yesterday a good day and laughed first time in a while. Poor appetite and lost weight. On paroxetine 40 mg for 3 weeks. No SE.   Trouble staying asleep. 4-5  Hours. Plan: Needs more time to respond to paroxetine 40 mg daily Deplin 15 mg daily for depression  With samples. Increase hydroxyzine for ST anxiety.  07/10/2020 appointment with the following noted: Depression is better but anxiety is through the roof.  Can laugh again.  Less hopeless and paralyzed by depression.  Now 5/10 depression.  Anxiety is still 10/10.  Will have nausea going out.  Has managed the anxiety pretty well.  Wonders if PD is causing anxiety.  Over the last 2-3 years more social anxiety.   No SE with Paxil. Hydroxyzine TID and 2 HS.  Lorazepam helps but manages it. Plan: Increase paroxetine 60 mg daily.  It helped depression but  not anxiety so far.  08/06/20 appt with following noted: Cant tell a difference.  Anxiety still up.  BP up with stress.  Takes Ativan prn. Started hydroxyzine 75 HS and sleeping better.  If EMA then hard to get back to sleep.  Not anxious at night typically.  Around people tense and heart races. I don't feel as depressed but no decorations for Xmas for the first time.  Typically after Jan 1 falls into depression and a bit anxious over it.  Doing AA by internet.  Sober since 2006.  No history of BZ dependence. Asked about BZ for severe anxiety.  Past Psychiatric Medication Trials:  Brief duloxetine, sertraline 200, paroxetine 40, buspirone dizzy, gabapentin,  Propranolol 40 BID Abilify helped the depression and sense of dread  but nothing for anxiety.  Review of Systems:   Review of Systems  Constitutional: Positive for unexpected weight change. Negative for fatigue.  Respiratory: Negative for shortness of breath.   Cardiovascular: Negative for chest pain and palpitations.  Gastrointestinal: Negative for abdominal distention.  Musculoskeletal: Negative for arthralgias.  Neurological: Positive for tremors. Negative for weakness.  Psychiatric/Behavioral: Positive for dysphoric mood. Negative for agitation, behavioral problems, confusion, decreased concentration, hallucinations, self-injury, sleep disturbance and suicidal ideas. The patient is nervous/anxious. The patient is not hyperactive.     Medications: I have reviewed the patient's current medications.  Current Outpatient Medications  Medication Sig Dispense Refill  . aspirin EC 81 MG tablet Take 1 tablet (81 mg total) by mouth 2 (two) times daily. (Patient taking differently: Take 81 mg by mouth daily.) 60 tablet 0  . carbidopa-levodopa (SINEMET IR) 25-100 MG tablet Take 1 tablet by mouth 3 (three) times daily before meals. 90 tablet 6  . hydrOXYzine (ATARAX/VISTARIL) 25 MG tablet TAKE 1 TABLET BY MOUTH THREE TIMES DAILY AS NEEDED  FOR ANXIETY AND 1 TO 2 TABLETS AT BEDTIME AS NEEDED FOR INSOMNIA( DISCONTINUE 10MG ) 100 tablet 1  . lamoTRIgine (LAMICTAL) 150 MG tablet TAKE ONE TABLET BY MOUTH AT BEDTIME 90 tablet 0  . lisinopril-hydrochlorothiazide (PRINZIDE,ZESTORETIC) 20-12.5 MG per tablet Take 1 tablet by mouth 2 (two) times daily.    . meloxicam (MOBIC) 15 MG tablet Take 15 mg by mouth daily.    . methocarbamol (ROBAXIN) 500 MG tablet Take 500 mg by mouth 3 (three) times daily.    . Multiple Vitamins-Minerals (MULTIVITAMIN WITH MINERALS) tablet Take 1 tablet by mouth daily.    omeprazole (PRILOSEC) 20 MG capsule Take 20 mg by mouth 2 (two) times daily.    Marland Kitchen PARoxetine (PAXIL) 30 MG tablet Take 2 tablets (60 mg total) by mouth daily. TAKE ONE TABLET BY MOUTH DAILY 60 tablet 1  . propranolol (INDERAL) 40 MG tablet Take 40 mg by mouth 2 (two) times daily.    . simvastatin (ZOCOR) 40 MG tablet Take 40 mg by mouth daily.    Marland Kitchen LORazepam (ATIVAN) 1 MG tablet 1 tablet prn panic attacks. Limit to 3 per week 12 tablet 0   No current facility-administered medications for this visit.    Medication Side Effects: None  Allergies: No Known Allergies  Past Medical History:  Diagnosis Date  . Alcoholism (HCC)    sober since 01-2011   . Anxiety   . Arthritis   . Balance problem   . Complication of anesthesia    unsuccessful spinal with right knee replacement, used general anesthesia  . Depression   . Difficulty concentrating   . Diverticulosis 2012  . GERD (gastroesophageal reflux disease)   . Hemorrhoids   . Hyperlipidemia   . Hypertension   . Internal hemorrhoids 2012  . Obsessive compulsive disorder   . Osteoarthritis   . Rotator cuff tear, left   . Tear of right rotator cuff 08/03/2014  . Wears contact lenses     Family History  Problem Relation Age of Onset  . Stroke Mother   . Diabetes Mother   . Cancer Father   . Parkinson's disease Maternal Grandmother   . Colon cancer Neg Hx     Social History    Socioeconomic History  . Marital status: Divorced    Spouse name: Not on file  . Number of children: 3  . Years of education: Not on file  . Highest education level: Bachelor's degree (  e.g., BA, AB, BS)  Occupational History  . Occupation: Tree surgeon  Tobacco Use  . Smoking status: Former Smoker    Types: E-cigarettes    Quit date: 08/01/2009    Years since quitting: 11.0  . Smokeless tobacco: Never Used  Vaping Use  . Vaping Use: Every day  . Start date: 08/12/2014  Substance and Sexual Activity  . Alcohol use: No    Comment: not since 2006  . Drug use: No    Comment: qut 1996  . Sexual activity: Not on file    Comment: uses e-cig  Other Topics Concern  . Not on file  Social History Narrative   Lives alone   No caffeine   Social Determinants of Health   Financial Resource Strain: Not on file  Food Insecurity: Not on file  Transportation Needs: Not on file  Physical Activity: Not on file  Stress: Not on file  Social Connections: Not on file  Intimate Partner Violence: Not on file    Past Medical History, Surgical history, Social history, and Family history were reviewed and updated as appropriate.   Please see review of systems for further details on the patient's review from today.   Objective:   Physical Exam:  There were no vitals taken for this visit.  Physical Exam Constitutional:      General: He is not in acute distress. Musculoskeletal:        General: No deformity.  Neurological:     Mental Status: He is alert and oriented to person, place, and time.     Cranial Nerves: No dysarthria.     Coordination: Coordination normal.  Psychiatric:        Attention and Perception: Attention and perception normal. He does not perceive auditory or visual hallucinations.        Mood and Affect: Mood is anxious and depressed. Affect is not labile, blunt, angry or inappropriate.        Speech: Speech normal.        Behavior: Behavior normal. Behavior  is cooperative.        Thought Content: Thought content normal. Thought content is not paranoid or delusional. Thought content does not include homicidal or suicidal ideation. Thought content does not include homicidal or suicidal plan.        Cognition and Memory: Cognition and memory normal.        Judgment: Judgment normal.     Comments: Talkative. Better than last session but anxiety greater than depression     Lab Review:     Component Value Date/Time   NA 136 08/02/2018 0458   K 5.0 08/02/2018 0458   CL 102 08/02/2018 0458   CO2 27 08/02/2018 0458   GLUCOSE 138 (H) 08/02/2018 0458   BUN 26 (H) 08/02/2018 0458   CREATININE 1.10 08/02/2018 0458   CALCIUM 9.0 08/02/2018 0458   PROT 7.6 08/12/2017 1445   ALBUMIN 4.3 08/12/2017 1445   AST 29 08/12/2017 1445   ALT 26 08/12/2017 1445   ALKPHOS 60 08/12/2017 1445   BILITOT 1.0 08/12/2017 1445   GFRNONAA >60 08/02/2018 0458   GFRAA >60 08/02/2018 0458       Component Value Date/Time   WBC 14.6 (H) 08/02/2018 0458   RBC 4.06 (L) 08/02/2018 0458   HGB 12.9 (L) 08/02/2018 0458   HCT 37.8 (L) 08/02/2018 0458   PLT 205 08/02/2018 0458   MCV 93.1 08/02/2018 0458   MCH 31.8 08/02/2018 0458   MCHC 34.1 08/02/2018  0458   RDW 12.9 08/02/2018 0458   LYMPHSABS 1.4 07/26/2018 0954   MONOABS 0.6 07/26/2018 0954   EOSABS 0.2 07/26/2018 0954   BASOSABS 0.0 07/26/2018 0954    No results found for: POCLITH, LITHIUM   No results found for: PHENYTOIN, PHENOBARB, VALPROATE, CBMZ   .res Assessment: Plan:    Jailen was seen today for follow-up, depression and anxiety.  Diagnoses and all orders for this visit:  GAD (generalized anxiety disorder)  Social anxiety disorder  Panic disorder with agoraphobia -     LORazepam (ATIVAN) 1 MG tablet; 1 tablet prn panic attacks. Limit to 3 per week  Moderately severe recurrent major depression (HCC)  PTSD (post-traumatic stress disorder)  Alcohol dependence, in remission  [facility])   Prior visit Patient stated he has been on antidepressants very long time and wondered what his emotional state would be like off the medication.  This was partly influenced by online patient for him to described problems with withdrawal coming off of SSRIs.  He successfully came off antidepressant without withdrawal symptoms.  But then has had relapse of both depression and anxiety within a few weeks off the antidepressant  Continue paroxetine 60 mg daily at least another month.  It helped depression but not anxiety so far. Yes he agrees.  Reevaluate after paroxetine trial.  Disc DDI with paroxetine.  Consider lithium, mirtazapine, Remeron, trazodone, Deplin  Deplin 15 mg daily for depression  With samples.  Increase propranolol to 60-80 mg TID for anxiety  Ativan 1 mg 3 times weekly or less.  Disc risk triggering alcohol abuse.  He wants it bc feels desperate.  We discussed the short-term risks associated with benzodiazepines including sedation and increased fall risk among others.  Discussed long-term side effect risk including dependence, potential withdrawal symptoms, and the potential eventual dose-related risk of dementia.  But recent studies from 2020 dispute this association between benzodiazepines and dementia risk. Newer studies in 2020 do not support an association with dementia.  hydroxyzine 25 mg TID prn anxiety  And 1-2 hs prn insomnia. Continue gabapentin.    Discussed his chronic thoughts about death. They resolved so far.  He commits to safety and is not having suicidal thoughts.  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   Disc BZ.   Extensive discussion of how Covid has allowed social anxiety to get worse but is functional.   Option retry lower dose of Buspirone.   Support continued sobriety.  Continue AA.    Follow-up 4 weeks  Meredith Staggers, MD, DFAPA     Please see After Visit Summary for patient specific instructions.  Future Appointments   Date Time Provider Department Center  09/05/2020 10:30 AM Cottle, Steva Ready., MD [facility] None  11/05/2020 11:30 AM Penumalli, Glenford Bayley, MD [facility] None    No orders of the defined types were placed in this encounter.     -------------------------------

## 2020-08-06 NOTE — Patient Instructions (Signed)
Increase propranolol to 1 and 1/2 or 2 of the propranolol 40 mg twice daily.  When you find the right dosage call the office

## 2020-08-20 ENCOUNTER — Telehealth: Payer: Self-pay | Admitting: Psychiatry

## 2020-08-20 ENCOUNTER — Other Ambulatory Visit: Payer: Self-pay | Admitting: Psychiatry

## 2020-08-20 DIAGNOSIS — F4001 Agoraphobia with panic disorder: Secondary | ICD-10-CM

## 2020-08-20 MED ORDER — LORAZEPAM 1 MG PO TABS: ORAL_TABLET | ORAL | 0 refills | Status: DC

## 2020-08-20 NOTE — Telephone Encounter (Signed)
I sent in prescription for lorazepam.  We had agreed at his appointment 2 weeks ago to continue the Paxil to give it a chance to help his anxiety until at least the third week of January.  We need to stick with that plan.  It may still work given more time.

## 2020-08-20 NOTE — Telephone Encounter (Signed)
Pt called and left a message stating that he is having daily panic attacks. He said the medicine you prescribed is not working and that he also needs a refill on his xanax. Please call him at [phone]

## 2020-08-21 ENCOUNTER — Other Ambulatory Visit: Payer: Self-pay | Admitting: Psychiatry

## 2020-08-21 DIAGNOSIS — F4001 Agoraphobia with panic disorder: Secondary | ICD-10-CM

## 2020-08-21 MED ORDER — LORAZEPAM 1 MG PO TABS: ORAL_TABLET | ORAL | 0 refills | Status: DC

## 2020-08-21 NOTE — Telephone Encounter (Signed)
Spoke with patient and advised him on the lorazepam and continuing the Paxil as discussed. He agreed. Has apt on 09/05/20

## 2020-08-21 NOTE — Telephone Encounter (Signed)
sent 

## 2020-08-21 NOTE — Telephone Encounter (Signed)
Pt called back and said that he is completely out of his xanax and that he was taking more than 3 a week and the pharmacy said that in order to fill the script that was sent is either call and approve an early refill or change the directions so that they can fill it.

## 2020-08-23 ENCOUNTER — Other Ambulatory Visit: Payer: Self-pay | Admitting: Psychiatry

## 2020-08-23 DIAGNOSIS — F4001 Agoraphobia with panic disorder: Secondary | ICD-10-CM

## 2020-08-23 DIAGNOSIS — F4310 Post-traumatic stress disorder, unspecified: Secondary | ICD-10-CM

## 2020-08-23 DIAGNOSIS — F411 Generalized anxiety disorder: Secondary | ICD-10-CM

## 2020-09-05 ENCOUNTER — Ambulatory Visit (INDEPENDENT_AMBULATORY_CARE_PROVIDER_SITE_OTHER): Payer: 59 | Admitting: Psychiatry

## 2020-09-05 ENCOUNTER — Other Ambulatory Visit: Payer: Self-pay

## 2020-09-05 ENCOUNTER — Encounter: Payer: Self-pay | Admitting: Psychiatry

## 2020-09-05 DIAGNOSIS — F4310 Post-traumatic stress disorder, unspecified: Secondary | ICD-10-CM

## 2020-09-05 DIAGNOSIS — F4001 Agoraphobia with panic disorder: Secondary | ICD-10-CM | POA: Diagnosis not present

## 2020-09-05 DIAGNOSIS — F331 Major depressive disorder, recurrent, moderate: Secondary | ICD-10-CM

## 2020-09-05 DIAGNOSIS — F411 Generalized anxiety disorder: Secondary | ICD-10-CM | POA: Diagnosis not present

## 2020-09-05 DIAGNOSIS — F1021 Alcohol dependence, in remission: Secondary | ICD-10-CM

## 2020-09-05 DIAGNOSIS — F332 Major depressive disorder, recurrent severe without psychotic features: Secondary | ICD-10-CM | POA: Diagnosis not present

## 2020-09-05 DIAGNOSIS — F4010 Social phobia, unspecified: Secondary | ICD-10-CM

## 2020-09-05 MED ORDER — PAROXETINE HCL 40 MG PO TABS: 80.0000 mg | ORAL_TABLET | Freq: Every day | ORAL | 0 refills | Status: DC

## 2020-09-05 MED ORDER — LORAZEPAM 1 MG PO TABS: ORAL_TABLET | ORAL | 0 refills | Status: DC

## 2020-09-05 MED ORDER — PROPRANOLOL HCL 40 MG PO TABS: 80.0000 mg | ORAL_TABLET | Freq: Two times a day (BID) | ORAL | 0 refills | Status: DC

## 2020-09-05 NOTE — Progress Notes (Signed)
Adam RainwaterHunter Crook [id number] [birth year] 60 y.o.    Subjective:   Patient ID:  Adam Harvey is a 60 y.o. (DOB [birth year]) male.  Chief Complaint:  Chief Complaint  Patient presents with  . Follow-up  . Anxiety  . Depression    Anxiety Symptoms include nervous/anxious behavior. Patient reports no chest pain, confusion, decreased concentration, palpitations, shortness of breath or suicidal ideas.    Medication Refill Pertinent negatives include no arthralgias, chest pain, fatigue or weakness.   Anne Geisen presents to the office today for follow-up of several diagnoses and wanted an earlier appointment because he wanted to restart duloxetine.  visit January 16, 2019.  He had wanted a trial off of antidepressants in part because of reading on the Internet about withdrawal symptoms.  He had been off antidepressants for 2 months.  And felt more anxious and depressed.  We discussed it and decided to return to duloxetine and increase to 60 mg daily.  visit in July after being back on the medication of duloxetine 60 mg daily he reported the following: It's great.  No depression but some anxiety that is managed.  He's 9/10 and satisfied.  Pleased with the dosage..  Tolerated well.  Sleep is good.  Better self care and better eating.  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  Energy better. Good appetite.  For years has had thoughts of if got terminal disease it would be easier and they are worse.  Hopelessness resolved. He had wanted to consider tapering off gabapentin and was given permission to do that if he chose to do so. He was continued on lamotrigine 150 mg daily.  visit October 16 2019.   Don't feel depressed and kind of happy but lingering thoughts of "is this as good as it gets?".  Not consuming him.  Not much excitement about things except work.  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  Some anxiety about being around people. Not DT Covid unless it made it  worse.  Prefer to be alone.  Did a lot of theater in the past but not excited like he used to be.  Likes yard work.  Not interested in dating.  Wants to talk about Gabapentin for pain.  Previously thought it helped.  Wants to try to stop it and see if he can do without it.  Knees don't hurt bc replaced. Plan:  Cont duloxetine 60 and add Abilify 5 Taper gabapentin off  12/25/19 appt, the following is noted: Anxiety not good.  Weary of dealing with it.  Had trouble dealing with number of people on the beach this weekend DT anxiety.  At times would like a BZ.  Always felt anxious but not managing it well.  Isolating. No SE gabapentin.  Did not stop it bc he forgot it. Plan: Increase gabapentin to 300 BID and 600 HS for 2 days, then incrase to 2 in AM  And HS and 1 in middle of the day for 4 days, then increase to 600 TID.  For a week, if NR then increase to 900  TID.    02/08/2020 appointment with the following noted: Increased gabapentin to 600 mg BID.  Has to nap if takes more.  No less anxious with the increase in dose.  No benefit to it. Asks about BZ.   Doesn't really want to leave the house and anxiety driving.  Can get anxious even in yard bc afraid of having to talk to people.  No  paranoia. Dread of things.  Emotional eating. Plan: Switch to paroxetine 30 mg for better antianxiety effect and hopefully help depression a little more.  Start one half of paroxetine 20 mg tablet and reduce duloxetine to 2 of the 20 mg capsules for 5 days, Then increase paroxetine to 1 tablet daily and reduce duloxetine to 1 capsule daily for 5 days, Then increase to 1-1/2 paroxetine tablets daily and stop duloxetine Continue Abilify 5 mg augmentation for residual anhedonia.   03/08/2020 phone call patient stating he felt Paxil was causing tremors in hands and feet and not helping with anxiety.  He was instructed to reduce paroxetine to 20 mg daily and stop Abilify for 3 days and then restart Abilify 1/2  tablet. 03/20/2020 phone call patient stating the above changes made no difference in tremor.  He was instructed to stop Abilify and increase paroxetine to 40 mg daily. 04/01/2020 patient called stating his PCP wanted him to stop taking paroxetine completely to see if the tremors would resolve. 05/01/2020 patient phone call stating he was diagnosed with Parkinson's disease but because of anxiety needed to be on some medication he was encouraged to restart the paroxetine which had never had an adequate trial for his anxiety. 06/03/2020 phone call from patient complaining of panic attacks.  He had increase paroxetine to 40 mg a day and was given hydroxyzine as needed.  06/07/2020 urgent appointment with the following noted: Not well.  Depression as bad as ever and anxiety through the roof.  Worse beginning of the week bc feels completely overwhelming.   Getting OOB, brushing teeth is hard, being in public hard. Social avoidance affecting work.  Don't feel scared of PD but slap in the face about age.  Single and in middle of 5 year bankruptcy.  Trouble with concentration. Not suicidal but death thoughts. Yesterday a good day and laughed first time in a while. Poor appetite and lost weight. On paroxetine 40 mg for 3 weeks. No SE.   Trouble staying asleep. 4-5  Hours. Plan: Needs more time to respond to paroxetine 40 mg daily Deplin 15 mg daily for depression  With samples. Increase hydroxyzine for ST anxiety.  07/10/2020 appointment with the following noted: Depression is better but anxiety is through the roof.  Can laugh again.  Less hopeless and paralyzed by depression.  Now 5/10 depression.  Anxiety is still 10/10.  Will have nausea going out.  Has managed the anxiety pretty well.  Wonders if PD is causing anxiety.  Over the last 2-3 years more social anxiety.   No SE with Paxil. Hydroxyzine TID and 2 HS.  Lorazepam helps but manages it. Plan: Increase paroxetine 60 mg daily.  It helped depression but  not anxiety so far.  08/06/20 appt with following noted: Cant tell a difference.  Anxiety still up.  BP up with stress.  Takes Ativan prn. Started hydroxyzine 75 HS and sleeping better.  If EMA then hard to get back to sleep.  Not anxious at night typically.  Around people tense and heart races. I don't feel as depressed but no decorations for Xmas for the first time.  Typically after Jan 1 falls into depression and a bit anxious over it. Plan: Continue paroxetine 60 mg daily at least another month.  It helped depression but not anxiety so far. Increase propranolol to 60-80 mg TID for anxiety  09/05/2020 appointment with following noted: Still awakens terrified of the world and hard to get OOB. Depression is still a  bit of a problem. Going anywhere to deal with anyone makes him very anxious to the point of nausea.  When takes lorazepam it eases it off rather than eliminating it. Lorazepam 1 mg avg daily.  Really bad Xmas.  Even to the point of scared to go out with kids.  Once out he does ok usually. SE a little tired.  Not severe.   Hydroxyzine during day and 75 mg at night for sleep and it helps. No libido. Dx PD so can't use risperidone   Doing AA by internet.  Sober since 2006.  No history of BZ dependence. Asked about BZ for severe anxiety.  Past Psychiatric Medication Trials:  Brief duloxetine, sertraline 200, paroxetine 60, buspirone dizzy, gabapentin,  Propranolol 40 BID Abilify helped the depression and sense of dread but nothing for anxiety. Dx PD so can't use risperidone  Review of Systems:   Review of Systems  Constitutional: Positive for unexpected weight change. Negative for fatigue.  Respiratory: Negative for shortness of breath.   Cardiovascular: Negative for chest pain and palpitations.  Gastrointestinal: Negative for abdominal distention.  Musculoskeletal: Negative for arthralgias.  Neurological: Positive for tremors. Negative for weakness.   Psychiatric/Behavioral: Positive for dysphoric mood. Negative for agitation, behavioral problems, confusion, decreased concentration, hallucinations, self-injury, sleep disturbance and suicidal ideas. The patient is nervous/anxious. The patient is not hyperactive.     Medications: I have reviewed the patient's current medications.  Current Outpatient Medications  Medication Sig Dispense Refill  . aspirin EC 81 MG tablet Take 1 tablet (81 mg total) by mouth 2 (two) times daily. (Patient taking differently: Take 81 mg by mouth daily.) 60 tablet 0  . carbidopa-levodopa (SINEMET IR) 25-100 MG tablet Take 1 tablet by mouth 3 (three) times daily before meals. 90 tablet 6  . hydrOXYzine (ATARAX/VISTARIL) 25 MG tablet TAKE 1 TABLET BY MOUTH THREE TIMES DAILY AS NEEDED FOR ANXIETY AND 1 TO 2 TABLETS AT BEDTIME AS NEEDED FOR INSOMNIA( DISCONTINUE 10MG ) 100 tablet 1  . lamoTRIgine (LAMICTAL) 150 MG tablet TAKE ONE TABLET BY MOUTH AT BEDTIME 90 tablet 0  . lisinopril-hydrochlorothiazide (PRINZIDE,ZESTORETIC) 20-12.5 MG per tablet Take 1 tablet by mouth 2 (two) times daily.    . meloxicam (MOBIC) 15 MG tablet Take 15 mg by mouth daily.    . methocarbamol (ROBAXIN) 500 MG tablet Take 500 mg by mouth 3 (three) times daily.    . Multiple Vitamins-Minerals (MULTIVITAMIN WITH MINERALS) tablet Take 1 tablet by mouth daily.    omeprazole (PRILOSEC) 20 MG capsule Take 20 mg by mouth 2 (two) times daily.    . simvastatin (ZOCOR) 40 MG tablet Take 40 mg by mouth daily.    Marland Kitchen LORazepam (ATIVAN) 1 MG tablet 1 tablet daily prn panic attacks. 14 tablet 0  . PARoxetine (PAXIL) 40 MG tablet Take 2 tablets (80 mg total) by mouth daily. 180 tablet 0  . propranolol (INDERAL) 40 MG tablet Take 2 tablets (80 mg total) by mouth 2 (two) times daily. 360 tablet 0   No current facility-administered medications for this visit.    Medication Side Effects: None  Allergies: No Known Allergies  Past Medical History:  Diagnosis  Date  . Alcoholism (HCC)    sober since 01-2011   . Anxiety   . Arthritis   . Balance problem   . Complication of anesthesia    unsuccessful spinal with right knee replacement, used general anesthesia  . Depression   . Difficulty concentrating   . Diverticulosis 2012  .  GERD (gastroesophageal reflux disease)   . Hemorrhoids   . Hyperlipidemia   . Hypertension   . Internal hemorrhoids 2012  . Obsessive compulsive disorder   . Osteoarthritis   . Rotator cuff tear, left   . Tear of right rotator cuff 08/03/2014  . Wears contact lenses     Family History  Problem Relation Age of Onset  . Stroke Mother   . Diabetes Mother   . Cancer Father   . Parkinson's disease Maternal Grandmother   . Colon cancer Neg Hx     Social History   Socioeconomic History  . Marital status: Divorced    Spouse name: Not on file  . Number of children: 3  . Years of education: Not on file  . Highest education level: Bachelor's degree (e.g., BA, AB, BS)  Occupational History  . Occupation: Tree surgeon  Tobacco Use  . Smoking status: Former Smoker    Types: E-cigarettes    Quit date: 08/01/2009    Years since quitting: 11.1  . Smokeless tobacco: Never Used  Vaping Use  . Vaping Use: Every day  . Start date: 08/12/2014  Substance and Sexual Activity  . Alcohol use: No    Comment: not since 2006  . Drug use: No    Comment: qut 1996  . Sexual activity: Not on file    Comment: uses e-cig  Other Topics Concern  . Not on file  Social History Narrative   Lives alone   No caffeine   Social Determinants of Health   Financial Resource Strain: Not on file  Food Insecurity: Not on file  Transportation Needs: Not on file  Physical Activity: Not on file  Stress: Not on file  Social Connections: Not on file  Intimate Partner Violence: Not on file    Past Medical History, Surgical history, Social history, and Family history were reviewed and updated as appropriate.   Please see review  of systems for further details on the patient's review from today.   Objective:   Physical Exam:  There were no vitals taken for this visit.  Physical Exam Constitutional:      General: He is not in acute distress. Musculoskeletal:        General: No deformity.  Neurological:     Mental Status: He is alert and oriented to person, place, and time.     Cranial Nerves: No dysarthria.     Coordination: Coordination normal.  Psychiatric:        Attention and Perception: Attention and perception normal. He does not perceive auditory or visual hallucinations.        Mood and Affect: Mood is anxious and depressed. Affect is not labile, blunt, angry or inappropriate.        Speech: Speech normal.        Behavior: Behavior normal. Behavior is cooperative.        Thought Content: Thought content normal. Thought content is not paranoid or delusional. Thought content does not include homicidal or suicidal ideation. Thought content does not include homicidal or suicidal plan.        Cognition and Memory: Cognition and memory normal.        Judgment: Judgment normal.     Comments: Talkative. Better but anxiety greater than depression     Lab Review:     Component Value Date/Time   NA 136 08/02/2018 0458   K 5.0 08/02/2018 0458   CL 102 08/02/2018 0458   CO2 27 08/02/2018 0458  GLUCOSE 138 (H) 08/02/2018 0458   BUN 26 (H) 08/02/2018 0458   CREATININE 1.10 08/02/2018 0458   CALCIUM 9.0 08/02/2018 0458   PROT 7.6 08/12/2017 1445   ALBUMIN 4.3 08/12/2017 1445   AST 29 08/12/2017 1445   ALT 26 08/12/2017 1445   ALKPHOS 60 08/12/2017 1445   BILITOT 1.0 08/12/2017 1445   GFRNONAA >60 08/02/2018 0458   GFRAA >60 08/02/2018 0458       Component Value Date/Time   WBC 14.6 (H) 08/02/2018 0458   RBC 4.06 (L) 08/02/2018 0458   HGB 12.9 (L) 08/02/2018 0458   HCT 37.8 (L) 08/02/2018 0458   PLT 205 08/02/2018 0458   MCV 93.1 08/02/2018 0458   MCH 31.8 08/02/2018 0458   MCHC 34.1  08/02/2018 0458   RDW 12.9 08/02/2018 0458   LYMPHSABS 1.4 07/26/2018 0954   MONOABS 0.6 07/26/2018 0954   EOSABS 0.2 07/26/2018 0954   BASOSABS 0.0 07/26/2018 0954    No results found for: POCLITH, LITHIUM   No results found for: PHENYTOIN, PHENOBARB, VALPROATE, CBMZ   .res Assessment: Plan:    Damiano was seen today for follow-up, anxiety and depression.  Diagnoses and all orders for this visit:  Panic disorder with agoraphobia -     LORazepam (ATIVAN) 1 MG tablet; 1 tablet daily prn panic attacks. -     propranolol (INDERAL) 40 MG tablet; Take 2 tablets (80 mg total) by mouth 2 (two) times daily.  GAD (generalized anxiety disorder) -     PARoxetine (PAXIL) 40 MG tablet; Take 2 tablets (80 mg total) by mouth daily.  Social anxiety disorder -     propranolol (INDERAL) 40 MG tablet; Take 2 tablets (80 mg total) by mouth 2 (two) times daily.  Moderately severe recurrent major depression (HCC)  PTSD (post-traumatic stress disorder) -     propranolol (INDERAL) 40 MG tablet; Take 2 tablets (80 mg total) by mouth 2 (two) times daily.  Alcohol dependence, in remission (HCC)  Major depressive disorder, recurrent episode, moderate (HCC) -     PARoxetine (PAXIL) 40 MG tablet; Take 2 tablets (80 mg total) by mouth daily.   Patient stated he has been on antidepressants very long time and wondered what his emotional state would be like off the medication.  This was partly influenced by online patient for him to described problems with withdrawal coming off of SSRIs.  He successfully came off antidepressant without withdrawal symptoms.  But then has had relapse of both depression and anxiety within a few weeks off the antidepressant  Increase paroxetine 80 mg daily at least another month.  It helped depression but not anxiety so far. Yes he agrees.  Reevaluate after paroxetine trial.  Disc DDI with paroxetine.  Avoid DM.  Disc high dose is still the best option.    Consider lithium,  mirtazapine, Remeron, trazodone, Deplin  Deplin 15 mg daily for depression  With samples.  Continue propranolol to 60-80 mg TID for anxiety  Ativan 1 mg 3 times weekly or less.  Disc risk triggering alcohol abuse.  He wants it bc feels desperate.  We discussed the short-term risks associated with benzodiazepines including sedation and increased fall risk among others.  Discussed long-term side effect risk including dependence, potential withdrawal symptoms, and the potential eventual dose-related risk of dementia.  But recent studies from 2020 dispute this association between benzodiazepines and dementia risk. Newer studies in 2020 do not support an association with dementia.  hydroxyzine 25 mg TID prn  anxiety  And 1-2 hs prn insomnia. Continue gabapentin.    Discussed his chronic thoughts about death. They resolved so far.  He commits to safety and is not having suicidal thoughts.  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   Disc BZ.   Extensive discussion of how Covid has allowed social anxiety to get worse but is functional.   Consider Genesight DT TR sx.   Option retry lower dose of Buspirone. Can't use risperidone or atypicals DT PD dx.  Support continued sobriety.  Continue AA.    Follow-up 4 weeks  Meredith Staggersarey Cottle, MD, DFAPA     Please see After Visit Summary for patient specific instructions.  Future Appointments  Date Time Provider Department Center  10/10/2020  1:45 PM Cottle, Steva Readyarey G Jr., MD [facility] None  11/05/2020 11:30 AM Penumalli, Glenford BayleyVikram R, MD [facility] None    No orders of the defined types were placed in this encounter.     -------------------------------

## 2020-09-30 ENCOUNTER — Other Ambulatory Visit: Payer: Self-pay | Admitting: Psychiatry

## 2020-09-30 DIAGNOSIS — F4001 Agoraphobia with panic disorder: Secondary | ICD-10-CM

## 2020-10-03 ENCOUNTER — Other Ambulatory Visit: Payer: Self-pay | Admitting: Psychiatry

## 2020-10-03 DIAGNOSIS — F4001 Agoraphobia with panic disorder: Secondary | ICD-10-CM

## 2020-10-03 DIAGNOSIS — F411 Generalized anxiety disorder: Secondary | ICD-10-CM

## 2020-10-03 DIAGNOSIS — F4310 Post-traumatic stress disorder, unspecified: Secondary | ICD-10-CM

## 2020-10-10 ENCOUNTER — Other Ambulatory Visit: Payer: Self-pay

## 2020-10-10 ENCOUNTER — Encounter: Payer: Self-pay | Admitting: Psychiatry

## 2020-10-10 ENCOUNTER — Ambulatory Visit (INDEPENDENT_AMBULATORY_CARE_PROVIDER_SITE_OTHER): Payer: 59 | Admitting: Psychiatry

## 2020-10-10 DIAGNOSIS — F4001 Agoraphobia with panic disorder: Secondary | ICD-10-CM

## 2020-10-10 DIAGNOSIS — F411 Generalized anxiety disorder: Secondary | ICD-10-CM

## 2020-10-10 DIAGNOSIS — F4010 Social phobia, unspecified: Secondary | ICD-10-CM

## 2020-10-10 DIAGNOSIS — F332 Major depressive disorder, recurrent severe without psychotic features: Secondary | ICD-10-CM

## 2020-10-10 DIAGNOSIS — F1021 Alcohol dependence, in remission: Secondary | ICD-10-CM

## 2020-10-10 DIAGNOSIS — F4310 Post-traumatic stress disorder, unspecified: Secondary | ICD-10-CM

## 2020-10-10 MED ORDER — CLONAZEPAM 1 MG PO TABS: 1.0000 mg | ORAL_TABLET | Freq: Two times a day (BID) | ORAL | 0 refills | Status: DC

## 2020-10-10 NOTE — Progress Notes (Signed)
Adam Harvey [id number] [birth year] 60 y.o.    Subjective:   Patient ID:  Adam Harvey is a 60 y.o. (DOB 04-Dec-1960) male.  Chief Complaint:  Chief Complaint  Patient presents with  . Follow-up  . Panic disorder with agoraphobia  . Anxiety    Anxiety Symptoms include nervous/anxious behavior. Patient reports no chest pain, confusion, decreased concentration, palpitations, shortness of breath or suicidal ideas.    Medication Refill Pertinent negatives include no arthralgias, chest pain, fatigue or weakness.   Adam Harvey presents to the office today for follow-up of several diagnoses and wanted an earlier appointment because he wanted to restart duloxetine.  visit January 16, 2019.  He had wanted a trial off of antidepressants in part because of reading on the Internet about withdrawal symptoms.  He had been off antidepressants for 2 months.  And felt more anxious and depressed.  We discussed it and decided to return to duloxetine and increase to 60 mg daily.  visit in July after being back on the medication of duloxetine 60 mg daily he reported the following: It's great.  No depression but some anxiety that is managed.  He's 9/10 and satisfied.  Pleased with the dosage..  Tolerated well.  Sleep is good.  Better self care and better eating.  Working really hard but business is good.  In Holiday representative.  Location manager.  Energy better. Good appetite.  For years has had thoughts of if got terminal disease it would be easier and they are worse.  Hopelessness resolved. He had wanted to consider tapering off gabapentin and was given permission to do that if he chose to do so. He was continued on lamotrigine 150 mg daily.  visit October 16 2019.   Don't feel depressed and kind of happy but lingering thoughts of "is this as good as it gets?".  Not consuming him.  Not much excitement about things except work.  Doing design work for Teacher, early years/pre.  Some anxiety about being around people. Not DT Covid  unless it made it worse.  Prefer to be alone.  Did a lot of theater in the past but not excited like he used to be.  Likes yard work.  Not interested in dating.  Wants to talk about Gabapentin for pain.  Previously thought it helped.  Wants to try to stop it and see if he can do without it.  Knees don't hurt bc replaced. Plan:  Cont duloxetine 60 and add Abilify 5 Taper gabapentin off  12/25/19 appt, the following is noted: Anxiety not good.  Weary of dealing with it.  Had trouble dealing with number of people on the beach this weekend DT anxiety.  At times would like a BZ.  Always felt anxious but not managing it well.  Isolating. No SE gabapentin.  Did not stop it bc he forgot it. Plan: Increase gabapentin to 300 BID and 600 HS for 2 days, then incrase to 2 in AM  And HS and 1 in middle of the day for 4 days, then increase to 600 TID.  For a week, if NR then increase to 900  TID.    02/08/2020 appointment with the following noted: Increased gabapentin to 600 mg BID.  Has to nap if takes more.  No less anxious with the increase in dose.  No benefit to it. Asks about BZ.   Doesn't really want to leave the house and anxiety driving.  Can get anxious even in yard bc afraid of having to talk to  people.  No paranoia. Dread of things.  Emotional eating. Plan: Switch to paroxetine 30 mg for better antianxiety effect and hopefully help depression a little more.  Start one half of paroxetine 20 mg tablet and reduce duloxetine to 2 of the 20 mg capsules for 5 days, Then increase paroxetine to 1 tablet daily and reduce duloxetine to 1 capsule daily for 5 days, Then increase to 1-1/2 paroxetine tablets daily and stop duloxetine Continue Abilify 5 mg augmentation for residual anhedonia.   03/08/2020 phone call patient stating he felt Paxil was causing tremors in hands and feet and not helping with anxiety.  He was instructed to reduce paroxetine to 20 mg daily and stop Abilify for 3 days and then restart  Abilify 1/2 tablet. 03/20/2020 phone call patient stating the above changes made no difference in tremor.  He was instructed to stop Abilify and increase paroxetine to 40 mg daily. 04/01/2020 patient called stating his PCP wanted him to stop taking paroxetine completely to see if the tremors would resolve. 05/01/2020 patient phone call stating he was diagnosed with Parkinson's disease but because of anxiety needed to be on some medication he was encouraged to restart the paroxetine which had never had an adequate trial for his anxiety. 06/03/2020 phone call from patient complaining of panic attacks.  He had increase paroxetine to 40 mg a day and was given hydroxyzine as needed.  06/07/2020 urgent appointment with the following noted: Not well.  Depression as bad as ever and anxiety through the roof.  Worse beginning of the week bc feels completely overwhelming.   Getting OOB, brushing teeth is hard, being in public hard. Social avoidance affecting work.  Don't feel scared of PD but slap in the face about age.  Single and in middle of 5 year bankruptcy.  Trouble with concentration. Not suicidal but death thoughts. Yesterday a good day and laughed first time in a while. Poor appetite and lost weight. On paroxetine 40 mg for 3 weeks. No SE.   Trouble staying asleep. 4-5  Hours. Plan: Needs more time to respond to paroxetine 40 mg daily Deplin 15 mg daily for depression  With samples. Increase hydroxyzine for ST anxiety.  07/10/2020 appointment with the following noted: Depression is better but anxiety is through the roof.  Can laugh again.  Less hopeless and paralyzed by depression.  Now 5/10 depression.  Anxiety is still 10/10.  Will have nausea going out.  Has managed the anxiety pretty well.  Wonders if PD is causing anxiety.  Over the last 2-3 years more social anxiety.   No SE with Paxil. Hydroxyzine TID and 2 HS.  Lorazepam helps but manages it. Plan: Increase paroxetine 60 mg daily.  It helped  depression but not anxiety so far.  08/06/20 appt with following noted: Cant tell a difference.  Anxiety still up.  BP up with stress.  Takes Ativan prn. Started hydroxyzine 75 HS and sleeping better.  If EMA then hard to get back to sleep.  Not anxious at night typically.  Around people tense and heart races. I don't feel as depressed but no decorations for Xmas for the first time.  Typically after Jan 1 falls into depression and a bit anxious over it. Plan: Continue paroxetine 60 mg daily at least another month.  It helped depression but not anxiety so far. Increase propranolol to 60-80 mg TID for anxiety  09/05/2020 appointment with following noted: Still awakens terrified of the world and hard to get OOB. Depression  is still a bit of a problem. Going anywhere to deal with anyone makes him very anxious to the point of nausea.  When takes lorazepam it eases it off rather than eliminating it. Lorazepam 1 mg avg daily.  Really bad Xmas.  Even to the point of scared to go out with kids.  Once out he does ok usually. SE a little tired.  Not severe.   Hydroxyzine during day and 75 mg at night for sleep and it helps. No libido. Dx PD so can't use risperidone Plan: Increase paroxetine 80 mg daily at least another month.  It helped depression but not anxiety so far.  10/10/2020 appointment with the following noted: Anxiety no better.  Hopeless and "waiting to die".  No joy or relaxation around the corner. Lorazepam helps otherwise shaking and sweating.  Cancelled appts yesterday.  Likes there's something inside the body that's twisting.   Doing AA by internet.  Sober since 2006.  No history of BZ dependence. Asked about BZ for severe anxiety.  Past Psychiatric Medication Trials:  Brief duloxetine, sertraline 200, paroxetine 80, buspirone dizzy, gabapentin,  Lorazepam  Propranolol 40 BID Abilify helped the depression and sense of dread but nothing for anxiety. Dx PD so can't use  risperidone  Review of Systems:   Review of Systems  Constitutional: Positive for unexpected weight change. Negative for fatigue.  Respiratory: Negative for shortness of breath.   Cardiovascular: Negative for chest pain and palpitations.  Gastrointestinal: Negative for abdominal distention.  Musculoskeletal: Negative for arthralgias.  Neurological: Positive for tremors. Negative for weakness.  Psychiatric/Behavioral: Positive for dysphoric mood. Negative for agitation, behavioral problems, confusion, decreased concentration, hallucinations, self-injury, sleep disturbance and suicidal ideas. The patient is nervous/anxious. The patient is not hyperactive.     Medications: I have reviewed the patient's current medications.  Current Outpatient Medications  Medication Sig Dispense Refill  . aspirin EC 81 MG tablet Take 1 tablet (81 mg total) by mouth 2 (two) times daily. (Patient taking differently: Take 81 mg by mouth daily.) 60 tablet 0  . carbidopa-levodopa (SINEMET IR) 25-100 MG tablet Take 1 tablet by mouth 3 (three) times daily before meals. 90 tablet 6  . clonazePAM (KLONOPIN) 1 MG tablet Take 1 tablet (1 mg total) by mouth 2 (two) times daily. 60 tablet 0  . hydrOXYzine (ATARAX/VISTARIL) 25 MG tablet TAKE 1 TABLET BY MOUTH THREE TIMES DAILY AND 1-2 TABLETS AT BEDTIME AS NEEDED FOR ANXIETY/INSOMNIA 100 tablet 1  . lamoTRIgine (LAMICTAL) 150 MG tablet TAKE ONE TABLET BY MOUTH AT BEDTIME 90 tablet 0  . lisinopril-hydrochlorothiazide (PRINZIDE,ZESTORETIC) 20-12.5 MG per tablet Take 1 tablet by mouth 2 (two) times daily.    . meloxicam (MOBIC) 15 MG tablet Take 15 mg by mouth daily.    . methocarbamol (ROBAXIN) 500 MG tablet Take 500 mg by mouth 3 (three) times daily.    . Multiple Vitamins-Minerals (MULTIVITAMIN WITH MINERALS) tablet Take 1 tablet by mouth daily.    Marland Kitchen omeprazole (PRILOSEC) 20 MG capsule Take 20 mg by mouth 2 (two) times daily.    Marland Kitchen PARoxetine (PAXIL) 40 MG tablet Take 2  tablets (80 mg total) by mouth daily. 180 tablet 0  . propranolol (INDERAL) 40 MG tablet Take 2 tablets (80 mg total) by mouth 2 (two) times daily. 360 tablet 0  . simvastatin (ZOCOR) 40 MG tablet Take 40 mg by mouth daily.     No current facility-administered medications for this visit.    Medication Side Effects: None  Allergies: No Known Allergies  Past Medical History:  Diagnosis Date  . Alcoholism (HCC)    sober since 01-2011   . Anxiety   . Arthritis   . Balance problem   . Complication of anesthesia    unsuccessful spinal with right knee replacement, used general anesthesia  . Depression   . Difficulty concentrating   . Diverticulosis 2012  . GERD (gastroesophageal reflux disease)   . Hemorrhoids   . Hyperlipidemia   . Hypertension   . Internal hemorrhoids 2012  . Obsessive compulsive disorder   . Osteoarthritis   . Rotator cuff tear, left   . Tear of right rotator cuff 08/03/2014  . Wears contact lenses     Family History  Problem Relation Age of Onset  . Stroke Mother   . Diabetes Mother   . Cancer Father   . Parkinson's disease Maternal Grandmother   . Colon cancer Neg Hx     Social History   Socioeconomic History  . Marital status: Divorced    Spouse name: Not on file  . Number of children: 3  . Years of education: Not on file  . Highest education level: Bachelor's degree (e.g., BA, AB, BS)  Occupational History  . Occupation: Tree surgeon  Tobacco Use  . Smoking status: Former Smoker    Types: E-cigarettes    Quit date: 08/01/2009    Years since quitting: 11.2  . Smokeless tobacco: Never Used  Vaping Use  . Vaping Use: Every day  . Start date: 08/12/2014  Substance and Sexual Activity  . Alcohol use: No    Comment: not since 2006  . Drug use: No    Comment: qut 1996  . Sexual activity: Not on file    Comment: uses e-cig  Other Topics Concern  . Not on file  Social History Narrative   Lives alone   No caffeine   Social  Determinants of Health   Financial Resource Strain: Not on file  Food Insecurity: Not on file  Transportation Needs: Not on file  Physical Activity: Not on file  Stress: Not on file  Social Connections: Not on file  Intimate Partner Violence: Not on file    Past Medical History, Surgical history, Social history, and Family history were reviewed and updated as appropriate.   Please see review of systems for further details on the patient's review from today.   Objective:   Physical Exam:  There were no vitals taken for this visit.  Physical Exam Constitutional:      General: He is not in acute distress. Musculoskeletal:        General: No deformity.  Neurological:     Mental Status: He is alert and oriented to person, place, and time.     Cranial Nerves: No dysarthria.     Coordination: Coordination normal.  Psychiatric:        Attention and Perception: Attention and perception normal. He does not perceive auditory or visual hallucinations.        Mood and Affect: Mood is anxious and depressed. Affect is not labile, blunt, angry or inappropriate.        Speech: Speech normal.        Behavior: Behavior normal. Behavior is cooperative.        Thought Content: Thought content normal. Thought content is not paranoid or delusional. Thought content does not include homicidal or suicidal ideation. Thought content does not include homicidal or suicidal plan.  Cognition and Memory: Cognition and memory normal.        Judgment: Judgment normal.     Comments: Talkative. Ongoing severe sx     Lab Review:     Component Value Date/Time   NA 136 08/02/2018 0458   K 5.0 08/02/2018 0458   CL 102 08/02/2018 0458   CO2 27 08/02/2018 0458   GLUCOSE 138 (H) 08/02/2018 0458   BUN 26 (H) 08/02/2018 0458   CREATININE 1.10 08/02/2018 0458   CALCIUM 9.0 08/02/2018 0458   PROT 7.6 08/12/2017 1445   ALBUMIN 4.3 08/12/2017 1445   AST 29 08/12/2017 1445   ALT 26 08/12/2017 1445    ALKPHOS 60 08/12/2017 1445   BILITOT 1.0 08/12/2017 1445   GFRNONAA >60 08/02/2018 0458   GFRAA >60 08/02/2018 0458       Component Value Date/Time   WBC 14.6 (H) 08/02/2018 0458   RBC 4.06 (L) 08/02/2018 0458   HGB 12.9 (L) 08/02/2018 0458   HCT 37.8 (L) 08/02/2018 0458   PLT 205 08/02/2018 0458   MCV 93.1 08/02/2018 0458   MCH 31.8 08/02/2018 0458   MCHC 34.1 08/02/2018 0458   RDW 12.9 08/02/2018 0458   LYMPHSABS 1.4 07/26/2018 0954   MONOABS 0.6 07/26/2018 0954   EOSABS 0.2 07/26/2018 0954   BASOSABS 0.0 07/26/2018 0954    No results found for: POCLITH, LITHIUM   No results found for: PHENYTOIN, PHENOBARB, VALPROATE, CBMZ   .res Assessment: Plan:    Durene CalHunter was seen today for follow-up, panic disorder with agoraphobia and anxiety.  Diagnoses and all orders for this visit:  Panic disorder with agoraphobia -     clonazePAM (KLONOPIN) 1 MG tablet; Take 1 tablet (1 mg total) by mouth 2 (two) times daily.  GAD (generalized anxiety disorder) -     clonazePAM (KLONOPIN) 1 MG tablet; Take 1 tablet (1 mg total) by mouth 2 (two) times daily.  Social anxiety disorder -     clonazePAM (KLONOPIN) 1 MG tablet; Take 1 tablet (1 mg total) by mouth 2 (two) times daily.  Moderately severe recurrent major depression (HCC)  PTSD (post-traumatic stress disorder)  Alcohol dependence, in remission [facility](HCC)   Patient stated he has been on antidepressants very long time and wondered what his emotional state would be like off the medication.  This was partly influenced by online patient for him to described problems with withdrawal coming off of SSRIs.  He successfully came off antidepressant without withdrawal symptoms.  But then has had relapse of both depression and anxiety within a few weeks off the antidepressant.  SX have been severe and persistent.  Continuee paroxetine 80 mg daily at least another month.  It helped depression but not anxiety so far. Yes he agrees.  Reevaluate after  paroxetine trial.  Disc DDI with paroxetine.  Avoid DM.  Disc high dose is still the best option.    Consider lithium, mirtazapine, Remeron, trazodone, Deplin  Deplin 15 mg daily for depression  With samples.  Continue propranolol to 60-80 mg TID for anxiety  DC Ativan. Klonopin 1 mg BID  Disc risk triggering alcohol abuse.  He wants it bc feels desperate.  Disc fall risk.  We discussed the short-term risks associated with benzodiazepines including sedation and increased fall risk among others.  Discussed long-term side effect risk including dependence, potential withdrawal symptoms, and the potential eventual dose-related risk of dementia.  But recent studies from 2020 dispute this association between benzodiazepines and dementia risk. Newer studies  in 2020 do not support an association with dementia.  hydroxyzine 25 mg TID prn anxiety  And 1-2 hs prn insomnia. Continue gabapentin.    Discussed his chronic thoughts about death. They resolved so far.  He commits to safety and is not having suicidal thoughts.  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   Disc BZ.   Extensive discussion of how Covid has allowed social anxiety to get worse but is functional.   Genesight DT TR sx.   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  Continue AA.    Follow-up 4 weeks  Meredith Staggers, MD, DFAPA     Please see After Visit Summary for patient specific instructions.  Future Appointments  Date Time Provider Department Center  11/05/2020 11:30 AM Penumalli, Glenford Bayley, MD [facility] None  11/06/2020  1:30 PM Cottle, Steva Ready., MD [facility] None    No orders of the defined types were placed in this encounter.     -------------------------------

## 2020-10-22 ENCOUNTER — Telehealth: Payer: Self-pay | Admitting: Psychiatry

## 2020-10-22 ENCOUNTER — Other Ambulatory Visit: Payer: Self-pay | Admitting: Psychiatry

## 2020-10-22 MED ORDER — LAMOTRIGINE 150 MG PO TABS
150.0000 mg | ORAL_TABLET | Freq: Every day | ORAL | 0 refills | Status: DC
Start: 2020-10-22 — End: 2021-01-20

## 2020-10-22 NOTE — Telephone Encounter (Signed)
Pt would like a new rx for Lamictal to be called in at his new pharmacy. Please send to [facility] on the corner of [address].

## 2020-11-05 ENCOUNTER — Ambulatory Visit: Payer: 59 | Admitting: Diagnostic Neuroimaging

## 2020-11-05 ENCOUNTER — Encounter: Payer: Self-pay | Admitting: Diagnostic Neuroimaging

## 2020-11-05 VITALS — BP 135/79 | HR 67 | Ht 72.0 in | Wt 257.0 lb

## 2020-11-05 DIAGNOSIS — G20 Parkinson's disease: Secondary | ICD-10-CM | POA: Diagnosis not present

## 2020-11-05 MED ORDER — CARBIDOPA-LEVODOPA 25-100 MG PO TABS
1.0000 | ORAL_TABLET | Freq: Three times a day (TID) | ORAL | 4 refills | Status: DC
Start: 2020-11-05 — End: 2021-07-21

## 2020-11-05 NOTE — Progress Notes (Signed)
[facility]  PATIENT: Adam Harvey DOB: 1961/02/04  REFERRING CLINICIAN: Geoffry Paradise, MD HISTORY FROM: patient  REASON FOR VISIT: follow up  HISTORICAL  CHIEF COMPLAINT:  Chief Complaint  Patient presents with  . Parkinson's disease    Rm 6, 6 month FU, "doing well on carb/levo"     HISTORY OF PRESENT ILLNESS:   UPDATE (11/06/2018, VRP): Since last visit, doing better on carbidopa levodopa, with great improvement tremor control.  Continues to have some issues with anxiety and panic attacks and agoraphobia.  He is working with psychiatry.  He is walking at least 5-6 times per week.  PRIOR HPI (05/01/20): 60 year old right-handed male with hypertension, hypercholesterolemia, depression, anxiety, here for evaluation of tremors.  Patient is noticed 3 to 4 months of intermittent right hand resting tremor.  Also has noted stiffness in muscles, apathy, anxiety, balance difficulty, ringing in ears, restless sleep.  Patient has family history of Parkinson's in his grandmother.  No change in smell or taste.  Had MRI of the brain which was unremarkable.    REVIEW OF SYSTEMS: Full 14 system review of systems performed and negative with exception of: As per HPI.  ALLERGIES: No Known Allergies  HOME MEDICATIONS: Outpatient Medications Prior to Visit  Medication Sig Dispense Refill  . aspirin EC 81 MG tablet Take 1 tablet (81 mg total) by mouth 2 (two) times daily. (Patient taking differently: Take 81 mg by mouth daily.) 60 tablet 0  . clonazePAM (KLONOPIN) 1 MG tablet Take 1 tablet (1 mg total) by mouth 2 (two) times daily. 60 tablet 0  . hydrOXYzine (ATARAX/VISTARIL) 25 MG tablet TAKE 1 TABLET BY MOUTH THREE TIMES DAILY AND 1-2 TABLETS AT BEDTIME AS NEEDED FOR ANXIETY/INSOMNIA 100 tablet 1  . lamoTRIgine (LAMICTAL) 150 MG tablet Take 1 tablet (150 mg total) by mouth at bedtime. 90 tablet 0  . lisinopril-hydrochlorothiazide (PRINZIDE,ZESTORETIC) 20-12.5 MG per  tablet Take 1 tablet by mouth 2 (two) times daily.    . meloxicam (MOBIC) 15 MG tablet Take 15 mg by mouth daily.    . methocarbamol (ROBAXIN) 500 MG tablet Take 500 mg by mouth 3 (three) times daily.    . Multiple Vitamins-Minerals (MULTIVITAMIN WITH MINERALS) tablet Take 1 tablet by mouth daily.    Marland Kitchen omeprazole (PRILOSEC) 20 MG capsule Take 20 mg by mouth 2 (two) times daily.    Marland Kitchen PARoxetine (PAXIL) 40 MG tablet Take 2 tablets (80 mg total) by mouth daily. 180 tablet 0  . propranolol (INDERAL) 40 MG tablet Take 2 tablets (80 mg total) by mouth 2 (two) times daily. 360 tablet 0  . simvastatin (ZOCOR) 40 MG tablet Take 40 mg by mouth daily.    . carbidopa-levodopa (SINEMET IR) 25-100 MG tablet Take 1 tablet by mouth 3 (three) times daily before meals. 90 tablet 6   No facility-administered medications prior to visit.    PAST MEDICAL HISTORY: Past Medical History:  Diagnosis Date  . Alcoholism (HCC)    sober since 01-2011   . Anxiety   . Arthritis   . Balance problem   . Complication of anesthesia    unsuccessful spinal with right knee replacement, used general anesthesia  . Depression   . Difficulty concentrating   . Diverticulosis 2012  . GERD (gastroesophageal reflux disease)   . Hemorrhoids   . Hyperlipidemia   . Hypertension   . Internal hemorrhoids 2012  . Obsessive compulsive disorder   . Osteoarthritis   . Rotator cuff tear, left   .  Tear of right rotator cuff 08/03/2014  . Wears contact lenses     PAST SURGICAL HISTORY: Past Surgical History:  Procedure Laterality Date  . ABCESS DRAINAGE  2012   nasal cyst  . ACHILLES TENDON REPAIR  2008   right  . ARTHOSCOPIC ROTAOR CUFF REPAIR Right 08/03/2014   Procedure: ARTHROSCOPIC ROTATOR CUFF REPAIR;  Surgeon: Eulas Post, MD;  Location: [facility];  Service: Orthopedics;  Laterality: Right;  . COLONOSCOPY  2012  . INGUINAL HERNIA REPAIR     right as infant  . KNEE ARTHROSCOPY Left    x4  . KNEE  SURGERY     x3-right  . ROTATOR CUFF REPAIR Left 2011    x 2  . TONSILLECTOMY    . TOTAL KNEE ARTHROPLASTY Right 12/2017   debride scar tissue  . TOTAL KNEE ARTHROPLASTY Right 08/20/2017   Procedure: TOTAL KNEE ARTHROPLASTY;  Surgeon: Frederico Hamman, MD;  Location: [facility];  Service: Orthopedics;  Laterality: Right;  . TOTAL KNEE ARTHROPLASTY Left 08/01/2018   Procedure: TOTAL KNEE ARTHROPLASTY;  Surgeon: Gean Birchwood, MD;  Location: [facility];  Service: Orthopedics;  Laterality: Left;  Adductor Block  . TYMPANOSTOMY TUBE PLACEMENT     as child, several times    FAMILY HISTORY: Family History  Problem Relation Age of Onset  . Stroke Mother   . Diabetes Mother   . Cancer Father   . Parkinson's disease Maternal Grandmother   . Colon cancer Neg Hx     SOCIAL HISTORY: Social History   Socioeconomic History  . Marital status: Divorced    Spouse name: Not on file  . Number of children: 3  . Years of education: Not on file  . Highest education level: Bachelor's degree (e.g., BA, AB, BS)  Occupational History  . Occupation: Tree surgeon  Tobacco Use  . Smoking status: Former Smoker    Types: E-cigarettes    Quit date: 08/01/2009    Years since quitting: 11.2  . Smokeless tobacco: Never Used  Vaping Use  . Vaping Use: Every day  . Start date: 08/12/2014  Substance and Sexual Activity  . Alcohol use: No    Comment: not since 2006  . Drug use: No    Comment: qut 1996  . Sexual activity: Not on file    Comment: uses e-cig  Other Topics Concern  . Not on file  Social History Narrative   Lives alone   No caffeine   Social Determinants of Health   Financial Resource Strain: Not on file  Food Insecurity: Not on file  Transportation Needs: Not on file  Physical Activity: Not on file  Stress: Not on file  Social Connections: Not on file  Intimate Partner Violence: Not on file     PHYSICAL EXAM  GENERAL EXAM/CONSTITUTIONAL: Vitals:  Vitals:   11/05/20 1129  BP:  135/79  Pulse: 67  Weight: 257 lb (116.6 kg)  Height: 6' (1.829 m)   Body mass index is 34.86 kg/m. Wt Readings from Last 3 Encounters:  11/05/20 257 lb (116.6 kg)  05/01/20 267 lb 9.6 oz (121.4 kg)  08/01/18 250 lb (113.4 kg)    Patient is in no distress; well developed, nourished and groomed; neck is supple  CARDIOVASCULAR:  Examination of carotid arteries is normal; no carotid bruits  Regular rate and rhythm, no murmurs  Examination of peripheral vascular system by observation and palpation is normal  EYES:  Ophthalmoscopic exam of optic discs and posterior segments is  normal; no papilledema or hemorrhages No exam data present  MUSCULOSKELETAL:  Gait, strength, tone, movements noted in Neurologic exam below  NEUROLOGIC: MENTAL STATUS:  No flowsheet data found.  awake, alert, oriented to person, place and time  recent and remote memory intact  normal attention and concentration  language fluent, comprehension intact, naming intact  fund of knowledge appropriate  CRANIAL NERVE:   2nd - no papilledema on fundoscopic exam  2nd, 3rd, 4th, 6th - pupils equal and reactive to light, visual fields full to confrontation, extraocular muscles intact, no nystagmus  5th - facial sensation symmetric  7th - facial strength symmetric  8th - hearing intact  9th - palate elevates symmetrically, uvula midline  11th - shoulder shrug symmetric  12th - tongue protrusion midline  MOTOR:   NO RESTING TREMOR IN RUE  NO COGWHEELING IN RUE  NO BRADYKINESIA  normal bulk and tone, full strength in the BUE, BLE  SENSORY:   normal and symmetric to light touch, temperature, vibration  COORDINATION:   finger-nose-finger, fine finger movements normal  REFLEXES:   deep tendon reflexes TRACE and symmetric  GAIT/STATION:   narrow based gait; SLIGHT DECR RIGHT ARM SWING     DIAGNOSTIC DATA (LABS, IMAGING, TESTING) - I reviewed patient records, labs, notes,  testing and imaging myself where available.  Lab Results  Component Value Date   WBC 14.6 (H) 08/02/2018   HGB 12.9 (L) 08/02/2018   HCT 37.8 (L) 08/02/2018   MCV 93.1 08/02/2018   PLT 205 08/02/2018      Component Value Date/Time   NA 136 08/02/2018 0458   K 5.0 08/02/2018 0458   CL 102 08/02/2018 0458   CO2 27 08/02/2018 0458   GLUCOSE 138 (H) 08/02/2018 0458   BUN 26 (H) 08/02/2018 0458   CREATININE 1.10 08/02/2018 0458   CALCIUM 9.0 08/02/2018 0458   PROT 7.6 08/12/2017 1445   ALBUMIN 4.3 08/12/2017 1445   AST 29 08/12/2017 1445   ALT 26 08/12/2017 1445   ALKPHOS 60 08/12/2017 1445   BILITOT 1.0 08/12/2017 1445   GFRNONAA >60 08/02/2018 0458   GFRAA >60 08/02/2018 0458   No results found for: CHOL, HDL, LDLCALC, LDLDIRECT, TRIG, CHOLHDL No results found for: QIWL7L No results found for: VITAMINB12 No results found for: TSH   04/11/20 MRI brain [I reviewed images myself and agree with interpretation. -VRP]  - No acute intracranial abnormality. No findings to explain patient's symptoms.    ASSESSMENT AND PLAN  60 y.o. year old male here with new onset of resting tremor, cogwheel rigidity, decreased arm swing with walking, unremarkable MRI of the brain.  Clinical picture suggests idiopathic Parkinson's disease.    Dx:  1. Parkinson's disease (HCC)      PLAN:  RESTING TREMOR/ RIGIDITY / PARKINSONISM  - continue carb/levo 1 tab three times a day  - encouraged patient to optimize nutrition and exercise  Meds ordered this encounter  Medications  . carbidopa-levodopa (SINEMET IR) 25-100 MG tablet    Sig: Take 1 tablet by mouth 3 (three) times daily before meals.    Dispense:  270 tablet    Refill:  4   Return in about 1 year (around 11/05/2021).    Suanne Marker, MD 11/05/2020, 11:58 AM Certified in Neurology, Neurophysiology and Neuroimaging  [facility] [address] [place], [place] [address] [phone]

## 2020-11-06 ENCOUNTER — Ambulatory Visit (INDEPENDENT_AMBULATORY_CARE_PROVIDER_SITE_OTHER): Payer: 59 | Admitting: Psychiatry

## 2020-11-06 ENCOUNTER — Other Ambulatory Visit: Payer: Self-pay

## 2020-11-06 ENCOUNTER — Other Ambulatory Visit: Payer: Self-pay | Admitting: Psychiatry

## 2020-11-06 ENCOUNTER — Encounter: Payer: Self-pay | Admitting: Psychiatry

## 2020-11-06 DIAGNOSIS — F4010 Social phobia, unspecified: Secondary | ICD-10-CM

## 2020-11-06 DIAGNOSIS — F1021 Alcohol dependence, in remission: Secondary | ICD-10-CM

## 2020-11-06 DIAGNOSIS — F332 Major depressive disorder, recurrent severe without psychotic features: Secondary | ICD-10-CM | POA: Diagnosis not present

## 2020-11-06 DIAGNOSIS — F4310 Post-traumatic stress disorder, unspecified: Secondary | ICD-10-CM

## 2020-11-06 DIAGNOSIS — F411 Generalized anxiety disorder: Secondary | ICD-10-CM

## 2020-11-06 DIAGNOSIS — F4001 Agoraphobia with panic disorder: Secondary | ICD-10-CM | POA: Diagnosis not present

## 2020-11-06 NOTE — Telephone Encounter (Signed)
Controlled substance 

## 2020-11-06 NOTE — Patient Instructions (Signed)
Reduce paroxetine by 50% every 2 weeks down to 20 mg and stay at 20 mg.  Start Viibryd 10 mg daily with 350 calories for 1 week then increase to 20 mg daily

## 2020-11-06 NOTE — Progress Notes (Signed)
Adam Harvey [id number] 1960/10/06 60 y.o.    Subjective:   Patient ID:  Adam Harvey is a 60 y.o. (DOB November 25, 1960) male.  Chief Complaint:  Chief Complaint  Patient presents with  . Follow-up  . Panic disorder with agoraphobia  . Anxiety  . Depression    Anxiety Symptoms include nervous/anxious behavior. Patient reports no chest pain, confusion, decreased concentration, palpitations, shortness of breath or suicidal ideas.    Medication Refill Pertinent negatives include no arthralgias, chest pain, fatigue or weakness.   Adam Harvey presents to the office today for follow-up of several diagnoses and wanted an earlier appointment because he wanted to restart duloxetine.  visit January 16, 2019.  He had wanted a trial off of antidepressants in part because of reading on the Internet about withdrawal symptoms.  He had been off antidepressants for 2 months.  And felt more anxious and depressed.  We discussed it and decided to return to duloxetine and increase to 60 mg daily.  visit in July after being back on the medication of duloxetine 60 mg daily he reported the following: It's great.  No depression but some anxiety that is managed.  He's 9/10 and satisfied.  Pleased with the dosage..  Tolerated well.  Sleep is good.  Better self care and better eating.  Working really hard but business is good.  In Holiday representative.  Location manager.  Energy better. Good appetite.  For years has had thoughts of if got terminal disease it would be easier and they are worse.  Hopelessness resolved. He had wanted to consider tapering off gabapentin and was given permission to do that if he chose to do so. He was continued on lamotrigine 150 mg daily.  visit October 16 2019.   Don't feel depressed and kind of happy but lingering thoughts of "is this as good as it gets?".  Not consuming him.  Not much excitement about things except work.  Doing design work for Teacher, early years/pre.  Some anxiety about being around  people. Not DT Covid unless it made it worse.  Prefer to be alone.  Did a lot of theater in the past but not excited like he used to be.  Likes yard work.  Not interested in dating.  Wants to talk about Gabapentin for pain.  Previously thought it helped.  Wants to try to stop it and see if he can do without it.  Knees don't hurt bc replaced. Plan:  Cont duloxetine 60 and add Abilify 5 Taper gabapentin off  12/25/19 appt, the following is noted: Anxiety not good.  Weary of dealing with it.  Had trouble dealing with number of people on the beach this weekend DT anxiety.  At times would like a BZ.  Always felt anxious but not managing it well.  Isolating. No SE gabapentin.  Did not stop it bc he forgot it. Plan: Increase gabapentin to 300 BID and 600 HS for 2 days, then incrase to 2 in AM  And HS and 1 in middle of the day for 4 days, then increase to 600 TID.  For a week, if NR then increase to 900  TID.    02/08/2020 appointment with the following noted: Increased gabapentin to 600 mg BID.  Has to nap if takes more.  No less anxious with the increase in dose.  No benefit to it. Asks about BZ.   Doesn't really want to leave the house and anxiety driving.  Can get anxious even in yard bc afraid of having  to talk to people.  No paranoia. Dread of things.  Emotional eating. Plan: Switch to paroxetine 30 mg for better antianxiety effect and hopefully help depression a little more.  Start one half of paroxetine 20 mg tablet and reduce duloxetine to 2 of the 20 mg capsules for 5 days, Then increase paroxetine to 1 tablet daily and reduce duloxetine to 1 capsule daily for 5 days, Then increase to 1-1/2 paroxetine tablets daily and stop duloxetine Continue Abilify 5 mg augmentation for residual anhedonia.   03/08/2020 phone call patient stating he felt Paxil was causing tremors in hands and feet and not helping with anxiety.  He was instructed to reduce paroxetine to 20 mg daily and stop Abilify for 3 days  and then restart Abilify 1/2 tablet. 03/20/2020 phone call patient stating the above changes made no difference in tremor.  He was instructed to stop Abilify and increase paroxetine to 40 mg daily. 04/01/2020 patient called stating his PCP wanted him to stop taking paroxetine completely to see if the tremors would resolve. 05/01/2020 patient phone call stating he was diagnosed with Parkinson's disease but because of anxiety needed to be on some medication he was encouraged to restart the paroxetine which had never had an adequate trial for his anxiety. 06/03/2020 phone call from patient complaining of panic attacks.  He had increase paroxetine to 40 mg a day and was given hydroxyzine as needed.  06/07/2020 urgent appointment with the following noted: Not well.  Depression as bad as ever and anxiety through the roof.  Worse beginning of the week bc feels completely overwhelming.   Getting OOB, brushing teeth is hard, being in public hard. Social avoidance affecting work.  Don't feel scared of PD but slap in the face about age.  Single and in middle of 5 year bankruptcy.  Trouble with concentration. Not suicidal but death thoughts. Yesterday a good day and laughed first time in a while. Poor appetite and lost weight. On paroxetine 40 mg for 3 weeks. No SE.   Trouble staying asleep. 4-5  Hours. Plan: Needs more time to respond to paroxetine 40 mg daily Deplin 15 mg daily for depression  With samples. Increase hydroxyzine for ST anxiety.  07/10/2020 appointment with the following noted: Depression is better but anxiety is through the roof.  Can laugh again.  Less hopeless and paralyzed by depression.  Now 5/10 depression.  Anxiety is still 10/10.  Will have nausea going out.  Has managed the anxiety pretty well.  Wonders if PD is causing anxiety.  Over the last 2-3 years more social anxiety.   No SE with Paxil. Hydroxyzine TID and 2 HS.  Lorazepam helps but manages it. Plan: Increase paroxetine 60 mg  daily.  It helped depression but not anxiety so far.  08/06/20 appt with following noted: Cant tell a difference.  Anxiety still up.  BP up with stress.  Takes Ativan prn. Started hydroxyzine 75 HS and sleeping better.  If EMA then hard to get back to sleep.  Not anxious at night typically.  Around people tense and heart races. I don't feel as depressed but no decorations for Xmas for the first time.  Typically after Jan 1 falls into depression and a bit anxious over it. Plan: Continue paroxetine 60 mg daily at least another month.  It helped depression but not anxiety so far. Increase propranolol to 60-80 mg TID for anxiety  09/05/2020 appointment with following noted: Still awakens terrified of the world and hard to  get OOB. Depression is still a bit of a problem. Going anywhere to deal with anyone makes him very anxious to the point of nausea.  When takes lorazepam it eases it off rather than eliminating it. Lorazepam 1 mg avg daily.  Really bad Xmas.  Even to the point of scared to go out with kids.  Once out he does ok usually. SE a little tired.  Not severe.   Hydroxyzine during day and 75 mg at night for sleep and it helps. No libido. Dx PD so can't use risperidone Plan: Increase paroxetine 80 mg daily at least another month.  It helped depression but not anxiety so far.  10/10/2020 appointment with the following noted: Anxiety no better.  Hopeless and "waiting to die".  No joy or relaxation around the corner. Lorazepam helps otherwise shaking and sweating.  Cancelled appts yesterday.  Likes there's something inside the body that's twisting. Plan no med changes.  We will give paroxetine 80 mg another month or so to help.  11/06/2020 appointment with the following noted: I still have pit in stomach but anxiety is better with less shaking sweating, SOB.  Still don't want to go out much.  Still a lot of worry.  Once home in PM then can let go easier.  Goes to bed at 9 and sleeps good.   Normal function. No change in hopelessness.  No joy or looking forward.  I feel sad and lonely a good part of the time.  Enjoyed football game. Neuro yesterday doing well with early stage PD.   Doing AA by internet.  Sober since 2006.  No history of BZ dependence. Asked about BZ for severe anxiety.  Past Psychiatric Medication Trials:  Brief duloxetine, sertraline 200, paroxetine 80, buspirone dizzy, gabapentin,  Lorazepam , clonazepam is better Hydroxyzine  Propranolol 40 BID Abilify helped the depression and sense of dread but nothing for anxiety. Dx PD so can't use risperidone  Review of Systems:   Review of Systems  Constitutional: Positive for unexpected weight change. Negative for fatigue.  Respiratory: Negative for chest tightness and shortness of breath.   Cardiovascular: Negative for chest pain and palpitations.  Gastrointestinal: Negative for abdominal distention.  Musculoskeletal: Negative for arthralgias.  Neurological: Positive for tremors. Negative for weakness.  Psychiatric/Behavioral: Positive for dysphoric mood. Negative for agitation, behavioral problems, confusion, decreased concentration, hallucinations, self-injury, sleep disturbance and suicidal ideas. The patient is nervous/anxious. The patient is not hyperactive.     Medications: I have reviewed the patient's current medications.  Current Outpatient Medications  Medication Sig Dispense Refill  . aspirin EC 81 MG tablet Take 1 tablet (81 mg total) by mouth 2 (two) times daily. (Patient taking differently: Take 81 mg by mouth daily.) 60 tablet 0  . carbidopa-levodopa (SINEMET IR) 25-100 MG tablet Take 1 tablet by mouth 3 (three) times daily before meals. 270 tablet 4  . clonazePAM (KLONOPIN) 1 MG tablet Take 1 tablet (1 mg total) by mouth 2 (two) times daily. 60 tablet 0  . hydrOXYzine (ATARAX/VISTARIL) 25 MG tablet TAKE 1 TABLET BY MOUTH THREE TIMES DAILY AND 1-2 TABLETS AT BEDTIME AS NEEDED FOR  ANXIETY/INSOMNIA 100 tablet 1  . lamoTRIgine (LAMICTAL) 150 MG tablet Take 1 tablet (150 mg total) by mouth at bedtime. 90 tablet 0  . lisinopril-hydrochlorothiazide (PRINZIDE,ZESTORETIC) 20-12.5 MG per tablet Take 1 tablet by mouth 2 (two) times daily.    . meloxicam (MOBIC) 15 MG tablet Take 15 mg by mouth daily.    Marland Kitchen  methocarbamol (ROBAXIN) 500 MG tablet Take 500 mg by mouth 3 (three) times daily.    . Multiple Vitamins-Minerals (MULTIVITAMIN WITH MINERALS) tablet Take 1 tablet by mouth daily.    Marland Kitchen omeprazole (PRILOSEC) 20 MG capsule Take 20 mg by mouth 2 (two) times daily.    Marland Kitchen PARoxetine (PAXIL) 40 MG tablet Take 2 tablets (80 mg total) by mouth daily. 180 tablet 0  . propranolol (INDERAL) 40 MG tablet Take 2 tablets (80 mg total) by mouth 2 (two) times daily. 360 tablet 0  . simvastatin (ZOCOR) 40 MG tablet Take 40 mg by mouth daily.     No current facility-administered medications for this visit.    Medication Side Effects: None  Allergies: No Known Allergies  Past Medical History:  Diagnosis Date  . Alcoholism (HCC)    sober since 01-2011   . Anxiety   . Arthritis   . Balance problem   . Complication of anesthesia    unsuccessful spinal with right knee replacement, used general anesthesia  . Depression   . Difficulty concentrating   . Diverticulosis 2012  . GERD (gastroesophageal reflux disease)   . Hemorrhoids   . Hyperlipidemia   . Hypertension   . Internal hemorrhoids 2012  . Obsessive compulsive disorder   . Osteoarthritis   . Rotator cuff tear, left   . Tear of right rotator cuff 08/03/2014  . Wears contact lenses     Family History  Problem Relation Age of Onset  . Stroke Mother   . Diabetes Mother   . Cancer Father   . Parkinson's disease Maternal Grandmother   . Colon cancer Neg Hx     Social History   Socioeconomic History  . Marital status: Divorced    Spouse name: Not on file  . Number of children: 3  . Years of education: Not on file  .  Highest education level: Bachelor's degree (e.g., BA, AB, BS)  Occupational History  . Occupation: Tree surgeon  Tobacco Use  . Smoking status: Former Smoker    Types: E-cigarettes    Quit date: 08/01/2009    Years since quitting: 11.2  . Smokeless tobacco: Never Used  Vaping Use  . Vaping Use: Every day  . Start date: 08/12/2014  Substance and Sexual Activity  . Alcohol use: No    Comment: not since 2006  . Drug use: No    Comment: qut 1996  . Sexual activity: Not on file    Comment: uses e-cig  Other Topics Concern  . Not on file  Social History Narrative   Lives alone   No caffeine   Social Determinants of Health   Financial Resource Strain: Not on file  Food Insecurity: Not on file  Transportation Needs: Not on file  Physical Activity: Not on file  Stress: Not on file  Social Connections: Not on file  Intimate Partner Violence: Not on file    Past Medical History, Surgical history, Social history, and Family history were reviewed and updated as appropriate.   Please see review of systems for further details on the patient's review from today.   Objective:   Physical Exam:  There were no vitals taken for this visit.  Physical Exam Constitutional:      General: He is not in acute distress. Musculoskeletal:        General: No deformity.  Neurological:     Mental Status: He is alert and oriented to person, place, and time.     Cranial Nerves:  No dysarthria.     Coordination: Coordination normal.  Psychiatric:        Attention and Perception: Attention and perception normal. He does not perceive auditory or visual hallucinations.        Mood and Affect: Mood is anxious and depressed. Affect is not labile, blunt, angry or inappropriate.        Speech: Speech normal.        Behavior: Behavior normal. Behavior is cooperative.        Thought Content: Thought content normal. Thought content is not paranoid or delusional. Thought content does not include  homicidal or suicidal ideation. Thought content does not include homicidal or suicidal plan.        Cognition and Memory: Cognition and memory normal.        Judgment: Judgment normal.     Comments: Talkative. Ongoing severe sx are partly better     Lab Review:     Component Value Date/Time   NA 136 08/02/2018 0458   K 5.0 08/02/2018 0458   CL 102 08/02/2018 0458   CO2 27 08/02/2018 0458   GLUCOSE 138 (H) 08/02/2018 0458   BUN 26 (H) 08/02/2018 0458   CREATININE 1.10 08/02/2018 0458   CALCIUM 9.0 08/02/2018 0458   PROT 7.6 08/12/2017 1445   ALBUMIN 4.3 08/12/2017 1445   AST 29 08/12/2017 1445   ALT 26 08/12/2017 1445   ALKPHOS 60 08/12/2017 1445   BILITOT 1.0 08/12/2017 1445   GFRNONAA >60 08/02/2018 0458   GFRAA >60 08/02/2018 0458       Component Value Date/Time   WBC 14.6 (H) 08/02/2018 0458   RBC 4.06 (L) 08/02/2018 0458   HGB 12.9 (L) 08/02/2018 0458   HCT 37.8 (L) 08/02/2018 0458   PLT 205 08/02/2018 0458   MCV 93.1 08/02/2018 0458   MCH 31.8 08/02/2018 0458   MCHC 34.1 08/02/2018 0458   RDW 12.9 08/02/2018 0458   LYMPHSABS 1.4 07/26/2018 0954   MONOABS 0.6 07/26/2018 0954   EOSABS 0.2 07/26/2018 0954   BASOSABS 0.0 07/26/2018 0954    No results found for: POCLITH, LITHIUM   No results found for: PHENYTOIN, PHENOBARB, VALPROATE, CBMZ   .res Assessment: Plan:    Rohil was seen today for follow-up, panic disorder with agoraphobia, anxiety and depression.  Diagnoses and all orders for this visit:  Moderately severe recurrent major depression (HCC)  Panic disorder with agoraphobia  GAD (generalized anxiety disorder)  Social anxiety disorder  PTSD (post-traumatic stress disorder)  Alcohol dependence, in remission [facility])   Patient stated he has been on antidepressants very long time and wondered what his emotional state would be like off the medication.  This was partly influenced by online patient for him to described problems with withdrawal  coming off of SSRIs.  He successfully came off antidepressant without withdrawal symptoms.  But then has had relapse of both depression and anxiety within a few weeks off the antidepressant.  SX have been severe and persistent.  Reduce paroxetine by 50% every 2 weeks down to 20 mg and stay at 20 mg.  Start Viibryd 10 mg daily with 350 calories for 1 week then increase to 20 mg daily  Consider lithium, mirtazapine, Remeron, trazodone, Deplin  DC Deplin bc NR.  Continue propranolol to 60-80 mg TID for anxiety  DC Ativan. Klonopin 1 mg BID  Disc risk triggering alcohol abuse.  He wants it bc feels desperate.  Disc fall risk.  We discussed the short-term risks associated  with benzodiazepines including sedation and increased fall risk among others.  Discussed long-term side effect risk including dependence, potential withdrawal symptoms, and the potential eventual dose-related risk of dementia.  But recent studies from 2020 dispute this association between benzodiazepines and dementia risk. Newer studies in 2020 do not support an association with dementia.  hydroxyzine 25 mg TID prn anxiety  And 1-2 hs prn insomnia. Continue gabapentin.    Discussed his chronic thoughts about death. They resolved so far.  He commits to safety and is not having suicidal thoughts.  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   Disc BZ.   Extensive discussion of how Covid has allowed social anxiety to get worse but is functional.   Genesight DT TR sx.  Reviewed at length.   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  Continue AA.    Follow-up 4 weeks  Meredith Staggersarey Cottle, MD, DFAPA     Please see After Visit Summary for patient specific instructions.  Future Appointments  Date Time Provider Department Center  12/10/2020  1:30 PM Cottle, Steva Readyarey G Jr., MD [facility] None  11/11/2021 11:30 AM Penumalli, Glenford BayleyVikram R, MD [facility] None    No orders of the defined types were placed in this  encounter.     -------------------------------

## 2020-11-13 ENCOUNTER — Other Ambulatory Visit: Payer: Self-pay | Admitting: Psychiatry

## 2020-11-13 DIAGNOSIS — F4310 Post-traumatic stress disorder, unspecified: Secondary | ICD-10-CM

## 2020-11-13 DIAGNOSIS — F4001 Agoraphobia with panic disorder: Secondary | ICD-10-CM

## 2020-11-13 DIAGNOSIS — F411 Generalized anxiety disorder: Secondary | ICD-10-CM

## 2020-11-15 ENCOUNTER — Telehealth: Payer: Self-pay | Admitting: Psychiatry

## 2020-11-15 NOTE — Telephone Encounter (Signed)
RF too early

## 2020-11-15 NOTE — Telephone Encounter (Signed)
Pt picked up refill on 10/27/20.should I still send?

## 2020-11-15 NOTE — Telephone Encounter (Signed)
Pt called and needs a refill on his hydroxzine 25 mg to the [facility] on [address]

## 2020-11-18 ENCOUNTER — Other Ambulatory Visit: Payer: Self-pay | Admitting: Psychiatry

## 2020-11-18 DIAGNOSIS — F411 Generalized anxiety disorder: Secondary | ICD-10-CM

## 2020-11-18 DIAGNOSIS — F4310 Post-traumatic stress disorder, unspecified: Secondary | ICD-10-CM

## 2020-11-18 DIAGNOSIS — F4001 Agoraphobia with panic disorder: Secondary | ICD-10-CM

## 2020-11-18 MED ORDER — HYDROXYZINE HCL 25 MG PO TABS: ORAL_TABLET | ORAL | 2 refills | Status: DC

## 2020-11-18 NOTE — Telephone Encounter (Signed)
Pt called back checking status on Hydroxyzine refill. Advised too soon. Pt stated he picked up 3/13 and taking 5 daily total, would only be 20 days. He is completely out. Today would be 23 days.  Pt ask for update  @ [phone]. Apt 4/26

## 2020-11-18 NOTE — Telephone Encounter (Signed)
Still too early?Or okay to send

## 2020-11-18 NOTE — Telephone Encounter (Signed)
Reviewed

## 2020-11-18 NOTE — Telephone Encounter (Signed)
I changed quantity to 30 days #150 and sent in RX

## 2020-12-02 ENCOUNTER — Telehealth: Payer: Self-pay | Admitting: Psychiatry

## 2020-12-02 ENCOUNTER — Other Ambulatory Visit: Payer: Self-pay | Admitting: Psychiatry

## 2020-12-02 DIAGNOSIS — F331 Major depressive disorder, recurrent, moderate: Secondary | ICD-10-CM

## 2020-12-02 DIAGNOSIS — F411 Generalized anxiety disorder: Secondary | ICD-10-CM

## 2020-12-02 MED ORDER — PAROXETINE HCL 40 MG PO TABS: 20.0000 mg | ORAL_TABLET | Freq: Every day | ORAL | 0 refills | Status: DC

## 2020-12-02 MED ORDER — VIIBRYD 40 MG PO TABS
ORAL_TABLET | ORAL | 0 refills | Status: DC
Start: 2020-12-02 — End: 2021-01-06

## 2020-12-02 NOTE — Telephone Encounter (Signed)
Prescription sent for Viibryd 40 mg tablets 1/2 tablet for 1 week and then 1 tablet daily.  This is tablet size change from his current 20 mg size tablets.  Pharmacy has been asked to inform the patient of this change.

## 2020-12-02 NOTE — Telephone Encounter (Signed)
Please review

## 2020-12-02 NOTE — Telephone Encounter (Signed)
Pt called to advise he has been taking Viibryd 20 mg 1/d for 3 weeks and has 2 days left. Requesting Rx to [facility]. Apt 4/26

## 2020-12-02 NOTE — Telephone Encounter (Signed)
Adam Harvey called back regarding prescription for Viibryd. States that it is to expensive and insurance will not cover. He said that he would discuss options at appt with CC 4/26. He also inquired about samples to get him through. Is that okay to pull for him. PLS RTC [phone]

## 2020-12-02 NOTE — Telephone Encounter (Signed)
Yes, ok to give samples viibryd to last until appt taking 20 mg daily.  Don't increase to 40 mg daily.

## 2020-12-03 NOTE — Telephone Encounter (Signed)
LVM with info

## 2020-12-10 ENCOUNTER — Encounter: Payer: Self-pay | Admitting: Psychiatry

## 2020-12-10 ENCOUNTER — Ambulatory Visit (INDEPENDENT_AMBULATORY_CARE_PROVIDER_SITE_OTHER): Payer: 59 | Admitting: Psychiatry

## 2020-12-10 ENCOUNTER — Other Ambulatory Visit: Payer: Self-pay

## 2020-12-10 DIAGNOSIS — F1021 Alcohol dependence, in remission: Secondary | ICD-10-CM

## 2020-12-10 DIAGNOSIS — F4001 Agoraphobia with panic disorder: Secondary | ICD-10-CM

## 2020-12-10 DIAGNOSIS — F331 Major depressive disorder, recurrent, moderate: Secondary | ICD-10-CM

## 2020-12-10 DIAGNOSIS — F411 Generalized anxiety disorder: Secondary | ICD-10-CM

## 2020-12-10 DIAGNOSIS — F4010 Social phobia, unspecified: Secondary | ICD-10-CM

## 2020-12-10 DIAGNOSIS — F4310 Post-traumatic stress disorder, unspecified: Secondary | ICD-10-CM

## 2020-12-10 NOTE — Progress Notes (Signed)
Adam Harvey [id number] 25-Aug-1960 60 y.o. 60 y.o.    Subjective:   Patient ID:  Adam Harvey is a 60 y.o. (DOB 07/25/1961) male.  Chief Complaint:  Chief Complaint  Patient presents with  . Follow-up  . Moderately severe recurrent major depression (HCC)  . Anxiety  . Depression    Anxiety Symptoms include nervous/anxious behavior. Patient reports no chest pain, confusion, decreased concentration, palpitations, shortness of breath or suicidal ideas.    Medication Refill Pertinent negatives include no arthralgias, chest pain, fatigue or weakness.   Adam Harvey presents to the office today for follow-up of several diagnoses and wanted an earlier appointment because he wanted to restart duloxetine.  visit January 16, 2019.  He had wanted a trial off of antidepressants in part because of reading on the Internet about withdrawal symptoms.  He had been off antidepressants for 2 months.  And felt more anxious and depressed.  We discussed it and decided to return to duloxetine and increase to 60 mg daily.  visit in July after being back on the medication of duloxetine 60 mg daily he reported the following: It's great.  No depression but some anxiety that is managed.  He's 9/10 and satisfied.  Pleased with the dosage..  Tolerated well.  Sleep is good.  Better self care and better eating.  Working really hard but business is good.  In Holiday representative.  Location manager.  Energy better. Good appetite.  For years has had thoughts of if got terminal disease it would be easier and they are worse.  Hopelessness resolved. He had wanted to consider tapering off gabapentin and was given permission to do that if he chose to do so. He was continued on lamotrigine 150 mg daily.  visit October 16 2019.   Don't feel depressed and kind of happy but lingering thoughts of "is this as good as it gets?".  Not consuming him.  Not much excitement about things except work.  Doing design work for Teacher, early years/pre.  Some anxiety about  being around people. Not DT Covid unless it made it worse.  Prefer to be alone.  Did a lot of theater in the past but not excited like he used to be.  Likes yard work.  Not interested in dating.  Wants to talk about Gabapentin for pain.  Previously thought it helped.  Wants to try to stop it and see if he can do without it.  Knees don't hurt bc replaced. Plan:  Cont duloxetine 60 and add Abilify 5 Taper gabapentin off  12/25/19 appt, the following is noted: Anxiety not good.  Weary of dealing with it.  Had trouble dealing with number of people on the beach this weekend DT anxiety.  At times would like a BZ.  Always felt anxious but not managing it well.  Isolating. No SE gabapentin.  Did not stop it bc he forgot it. Plan: Increase gabapentin to 300 BID and 600 HS for 2 days, then incrase to 2 in AM  And HS and 1 in middle of the day for 4 days, then increase to 600 TID.  For a week, if NR then increase to 900  TID.    02/08/2020 appointment with the following noted: Increased gabapentin to 600 mg BID.  Has to nap if takes more.  No less anxious with the increase in dose.  No benefit to it. Asks about BZ.   Doesn't really want to leave the house and anxiety driving.  Can get anxious even in yard bc afraid  of having to talk to people.  No paranoia. Dread of things.  Emotional eating. Plan: Switch to paroxetine 30 mg for better antianxiety effect and hopefully help depression a little more.  Start one half of paroxetine 20 mg tablet and reduce duloxetine to 2 of the 20 mg capsules for 5 days, Then increase paroxetine to 1 tablet daily and reduce duloxetine to 1 capsule daily for 5 days, Then increase to 1-1/2 paroxetine tablets daily and stop duloxetine Continue Abilify 5 mg augmentation for residual anhedonia.   03/08/2020 phone call patient stating he felt Paxil was causing tremors in hands and feet and not helping with anxiety.  He was instructed to reduce paroxetine to 20 mg daily and stop  Abilify for 3 days and then restart Abilify 1/2 tablet. 03/20/2020 phone call patient stating the above changes made no difference in tremor.  He was instructed to stop Abilify and increase paroxetine to 40 mg daily. 04/01/2020 patient called stating his PCP wanted him to stop taking paroxetine completely to see if the tremors would resolve. 05/01/2020 patient phone call stating he was diagnosed with Parkinson's disease but because of anxiety needed to be on some medication he was encouraged to restart the paroxetine which had never had an adequate trial for his anxiety. 06/03/2020 phone call from patient complaining of panic attacks.  He had increase paroxetine to 40 mg a day and was given hydroxyzine as needed.  06/07/2020 urgent appointment with the following noted: Not well.  Depression as bad as ever and anxiety through the roof.  Worse beginning of the week bc feels completely overwhelming.   Getting OOB, brushing teeth is hard, being in public hard. Social avoidance affecting work.  Don't feel scared of PD but slap in the face about age.  Single and in middle of 5 year bankruptcy.  Trouble with concentration. Not suicidal but death thoughts. Yesterday a good day and laughed first time in a while. Poor appetite and lost weight. On paroxetine 40 mg for 3 weeks. No SE.   Trouble staying asleep. 4-5  Hours. Plan: Needs more time to respond to paroxetine 40 mg daily Deplin 15 mg daily for depression  With samples. Increase hydroxyzine for ST anxiety.  07/10/2020 appointment with the following noted: Depression is better but anxiety is through the roof.  Can laugh again.  Less hopeless and paralyzed by depression.  Now 5/10 depression.  Anxiety is still 10/10.  Will have nausea going out.  Has managed the anxiety pretty well.  Wonders if PD is causing anxiety.  Over the last 2-3 years more social anxiety.   No SE with Paxil. Hydroxyzine TID and 2 HS.  Lorazepam helps but manages it. Plan: Increase  paroxetine 60 mg daily.  It helped depression but not anxiety so far.  08/06/20 appt with following noted: Cant tell a difference.  Anxiety still up.  BP up with stress.  Takes Ativan prn. Started hydroxyzine 75 HS and sleeping better.  If EMA then hard to get back to sleep.  Not anxious at night typically.  Around people tense and heart races. I don't feel as depressed but no decorations for Xmas for the first time.  Typically after Jan 1 falls into depression and a bit anxious over it. Plan: Continue paroxetine 60 mg daily at least another month.  It helped depression but not anxiety so far. Increase propranolol to 60-80 mg TID for anxiety  09/05/2020 appointment with following noted: Still awakens terrified of the world and  hard to get OOB. Depression is still a bit of a problem. Going anywhere to deal with anyone makes him very anxious to the point of nausea.  When takes lorazepam it eases it off rather than eliminating it. Lorazepam 1 mg avg daily.  Really bad Xmas.  Even to the point of scared to go out with kids.  Once out he does ok usually. SE a little tired.  Not severe.   Hydroxyzine during day and 75 mg at night for sleep and it helps. No libido. Dx PD so can't use risperidone Plan: Increase paroxetine 80 mg daily at least another month.  It helped depression but not anxiety so far.  10/10/2020 appointment with the following noted: Anxiety no better.  Hopeless and "waiting to die".  No joy or relaxation around the corner. Lorazepam helps otherwise shaking and sweating.  Cancelled appts yesterday.  Likes there's something inside the body that's twisting. Plan no med changes.  We will give paroxetine 80 mg another month or so to help.  11/06/2020 appointment with the following noted: I still have pit in stomach but anxiety is better with less shaking sweating, SOB.  Still don't want to go out much.  Still a lot of worry.  Once home in PM then can let go easier.  Goes to bed at 9 and  sleeps good.  Normal function. No change in hopelessness.  No joy or looking forward.  I feel sad and lonely a good part of the time.  Enjoyed football game. Neuro yesterday doing well with early stage PD. Plan: Reduce paroxetine by 50% every 2 weeks down to 20 mg and stay at 20 mg. Start Viibryd 10 mg daily with 350 calories for 1 week then increase to 20 mg daily  12/10/20 appt noted:  Problem with cost Viibryd. Continued with Viibryd 20 mg daily and down to paroxetine 20 mg daily without withdrawal.  The terror of the outside world continues.  Still anxious around most other people and going out. For the first time in a long while,  over the last week, he felt joy and connection and he even made other people laugh.  Had less anxiety around them than usual.  Less hopeless.  Managing work better. Found copay card and last RX $45.   No SE and no withdrawal.    Doing AA by internet.  Sober since 2006.  No history of BZ dependence. Asked about BZ for severe anxiety.  Past Psychiatric Medication Trials:  Brief duloxetine, sertraline 200, paroxetine 80, buspirone dizzy, gabapentin,  Lorazepam , clonazepam is better Hydroxyzine  Propranolol 40 BID Abilify helped the depression and sense of dread but nothing for anxiety. Dx PD so can't use risperidone  Review of Systems:   Review of Systems  Constitutional: Positive for unexpected weight change. Negative for fatigue.  Respiratory: Negative for chest tightness and shortness of breath.   Cardiovascular: Negative for chest pain and palpitations.  Gastrointestinal: Negative for abdominal distention.  Musculoskeletal: Negative for arthralgias.  Neurological: Positive for tremors. Negative for weakness.  Psychiatric/Behavioral: Positive for dysphoric mood. Negative for agitation, behavioral problems, confusion, decreased concentration, hallucinations, self-injury, sleep disturbance and suicidal ideas. The patient is nervous/anxious. The patient  is not hyperactive.     Medications: I have reviewed the patient's current medications.  Current Outpatient Medications  Medication Sig Dispense Refill  . aspirin EC 81 MG tablet Take 1 tablet (81 mg total) by mouth 2 (two) times daily. (Patient taking differently: Take 81 mg  by mouth daily.) 60 tablet 0  . carbidopa-levodopa (SINEMET IR) 25-100 MG tablet Take 1 tablet by mouth 3 (three) times daily before meals. 270 tablet 4  . clonazePAM (KLONOPIN) 1 MG tablet TAKE 1 TABLET(1 MG) BY MOUTH TWICE DAILY 60 tablet 2  . hydrOXYzine (ATARAX/VISTARIL) 25 MG tablet TAKE 1 TABLET BY MOUTH THREE TIMES DAILY AND 1-2 TABLETS AT BEDTIME AS NEEDED FOR ANXIETY/INSOMNIA 150 tablet 2  . lamoTRIgine (LAMICTAL) 150 MG tablet Take 1 tablet (150 mg total) by mouth at bedtime. 90 tablet 0  . lisinopril-hydrochlorothiazide (PRINZIDE,ZESTORETIC) 20-12.5 MG per tablet Take 1 tablet by mouth 2 (two) times daily.    . meloxicam (MOBIC) 15 MG tablet Take 15 mg by mouth daily.    . methocarbamol (ROBAXIN) 500 MG tablet Take 500 mg by mouth 3 (three) times daily.    . Multiple Vitamins-Minerals (MULTIVITAMIN WITH MINERALS) tablet Take 1 tablet by mouth daily.    Marland Kitchen omeprazole (PRILOSEC) 20 MG capsule Take 20 mg by mouth 2 (two) times daily.    Marland Kitchen PARoxetine (PAXIL) 40 MG tablet Take 0.5 tablets (20 mg total) by mouth daily. 45 tablet 0  . propranolol (INDERAL) 40 MG tablet Take 2 tablets (80 mg total) by mouth 2 (two) times daily. (Patient taking differently: Take 80 mg by mouth daily.) 360 tablet 0  . simvastatin (ZOCOR) 40 MG tablet Take 40 mg by mouth daily.    . Vilazodone HCl (VIIBRYD) 40 MG TABS 1/2 tablet daily for 7 days then 1 tablet daily (Patient taking differently: Take 20 mg by mouth daily.) 30 tablet 0   No current facility-administered medications for this visit.    Medication Side Effects: None  Allergies: No Known Allergies  Past Medical History:  Diagnosis Date  . Alcoholism (HCC)    sober since  01-2011   . Anxiety   . Arthritis   . Balance problem   . Complication of anesthesia    unsuccessful spinal with right knee replacement, used general anesthesia  . Depression   . Difficulty concentrating   . Diverticulosis 2012  . GERD (gastroesophageal reflux disease)   . Hemorrhoids   . Hyperlipidemia   . Hypertension   . Internal hemorrhoids 2012  . Obsessive compulsive disorder   . Osteoarthritis   . Rotator cuff tear, left   . Tear of right rotator cuff 08/03/2014  . Wears contact lenses     Family History  Problem Relation Age of Onset  . Stroke Mother   . Diabetes Mother   . Cancer Father   . Parkinson's disease Maternal Grandmother   . Colon cancer Neg Hx     Social History   Socioeconomic History  . Marital status: Divorced    Spouse name: Not on file  . Number of children: 3  . Years of education: Not on file  . Highest education level: Bachelor's degree (e.g., BA, AB, BS)  Occupational History  . Occupation: Tree surgeon  Tobacco Use  . Smoking status: Former Smoker    Types: E-cigarettes    Quit date: 08/01/2009    Years since quitting: 11.3  . Smokeless tobacco: Never Used  Vaping Use  . Vaping Use: Every day  . Start date: 08/12/2014  Substance and Sexual Activity  . Alcohol use: No    Comment: not since 2006  . Drug use: No    Comment: qut 1996  . Sexual activity: Not on file    Comment: uses e-cig  Other Topics Concern  .  Not on file  Social History Narrative   Lives alone   No caffeine   Social Determinants of Health   Financial Resource Strain: Not on file  Food Insecurity: Not on file  Transportation Needs: Not on file  Physical Activity: Not on file  Stress: Not on file  Social Connections: Not on file  Intimate Partner Violence: Not on file    Past Medical History, Surgical history, Social history, and Family history were reviewed and updated as appropriate.   Please see review of systems for further details on the  patient's review from today.   Objective:   Physical Exam:  There were no vitals taken for this visit.  Physical Exam Constitutional:      General: He is not in acute distress. Musculoskeletal:        General: No deformity.  Neurological:     Mental Status: He is alert and oriented to person, place, and time.     Cranial Nerves: No dysarthria.     Coordination: Coordination normal.  Psychiatric:        Attention and Perception: Attention and perception normal. He does not perceive auditory or visual hallucinations.        Mood and Affect: Mood is anxious and depressed. Affect is not labile, blunt, angry or inappropriate.        Speech: Speech normal.        Behavior: Behavior normal. Behavior is cooperative.        Thought Content: Thought content normal. Thought content is not paranoid or delusional. Thought content does not include homicidal or suicidal ideation. Thought content does not include homicidal or suicidal plan.        Cognition and Memory: Cognition and memory normal.        Judgment: Judgment normal.     Comments: Talkative. Ongoing severe sx are partly better     Lab Review:     Component Value Date/Time   NA 136 08/02/2018 0458   K 5.0 08/02/2018 0458   CL 102 08/02/2018 0458   CO2 27 08/02/2018 0458   GLUCOSE 138 (H) 08/02/2018 0458   BUN 26 (H) 08/02/2018 0458   CREATININE 1.10 08/02/2018 0458   CALCIUM 9.0 08/02/2018 0458   PROT 7.6 08/12/2017 1445   ALBUMIN 4.3 08/12/2017 1445   AST 29 08/12/2017 1445   ALT 26 08/12/2017 1445   ALKPHOS 60 08/12/2017 1445   BILITOT 1.0 08/12/2017 1445   GFRNONAA >60 08/02/2018 0458   GFRAA >60 08/02/2018 0458       Component Value Date/Time   WBC 14.6 (H) 08/02/2018 0458   RBC 4.06 (L) 08/02/2018 0458   HGB 12.9 (L) 08/02/2018 0458   HCT 37.8 (L) 08/02/2018 0458   PLT 205 08/02/2018 0458   MCV 93.1 08/02/2018 0458   MCH 31.8 08/02/2018 0458   MCHC 34.1 08/02/2018 0458   RDW 12.9 08/02/2018 0458    LYMPHSABS 1.4 07/26/2018 0954   MONOABS 0.6 07/26/2018 0954   EOSABS 0.2 07/26/2018 0954   BASOSABS 0.0 07/26/2018 0954    No results found for: POCLITH, LITHIUM   No results found for: PHENYTOIN, PHENOBARB, VALPROATE, CBMZ   .res Assessment: Plan:    Adam Harvey was seen today for follow-up, moderately severe recurrent major depression (hcc), anxiety and depression.  Diagnoses and all orders for this visit:  Major depressive disorder, recurrent episode, moderate (HCC)  GAD (generalized anxiety disorder)  Panic disorder with agoraphobia  PTSD (post-traumatic stress disorder)  Social anxiety disorder  Alcohol dependence, in remission [facility](HCC)   Patient stated he has been on antidepressants very long time and wondered what his emotional state would be like off the medication.  This was partly influenced by online patient for him to described problems with withdrawal coming off of SSRIs.  He successfully came off antidepressant without withdrawal symptoms.  But then has had relapse of both depression and anxiety within a few weeks off the antidepressant.  SX have been severe and persistent.   paroxetine  stay at 20 mg until the increase in Viibryd can have benefit  Increase Viibryd with 350 calories to 40 mg daily  Consider lithium, mirtazapine, Remeron, trazodone, Deplin  DC Deplin bc NR.  Continue propranolol to 60-80 mg TID for anxiety  DC Ativan. Klonopin 1 mg BID  Disc risk triggering alcohol abuse.  He wants it bc feels desperate.  Disc fall risk.  We discussed the short-term risks associated with benzodiazepines including sedation and increased fall risk among others.  Discussed long-term side effect risk including dependence, potential withdrawal symptoms, and the potential eventual dose-related risk of dementia.  But recent studies from 2020 dispute this association between benzodiazepines and dementia risk. Newer studies in 2020 do not support an association with  dementia.  hydroxyzine 25 mg TID prn anxiety  And 1-2 hs prn insomnia. Continue gabapentin.    Discussed his chronic thoughts about death. They resolved so far.  He commits to safety and is not having suicidal thoughts.  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   Disc BZ.   Extensive discussion of how Covid has allowed social anxiety to get worse but is functional.   Genesight DT TR sx.  Reviewed at length.   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  Continue AA.    Follow-up 4 weeks  Adam Staggersarey Cottle, MD, DFAPA     Please see After Visit Summary for patient specific instructions.  Future Appointments  Date Time Provider Department Center  01/07/2021 11:30 AM Harvey, Steva Readyarey G Jr., MD [facility] None  11/11/2021 11:30 AM Penumalli, Glenford BayleyVikram R, MD [facility] None    No orders of the defined types were placed in this encounter.     -------------------------------

## 2021-01-05 ENCOUNTER — Other Ambulatory Visit: Payer: Self-pay | Admitting: Psychiatry

## 2021-01-07 ENCOUNTER — Ambulatory Visit (INDEPENDENT_AMBULATORY_CARE_PROVIDER_SITE_OTHER): Payer: 59 | Admitting: Psychiatry

## 2021-01-07 ENCOUNTER — Encounter: Payer: Self-pay | Admitting: Psychiatry

## 2021-01-07 ENCOUNTER — Other Ambulatory Visit: Payer: Self-pay

## 2021-01-07 DIAGNOSIS — F331 Major depressive disorder, recurrent, moderate: Secondary | ICD-10-CM | POA: Diagnosis not present

## 2021-01-07 DIAGNOSIS — F1021 Alcohol dependence, in remission: Secondary | ICD-10-CM

## 2021-01-07 DIAGNOSIS — F4310 Post-traumatic stress disorder, unspecified: Secondary | ICD-10-CM | POA: Diagnosis not present

## 2021-01-07 DIAGNOSIS — F411 Generalized anxiety disorder: Secondary | ICD-10-CM | POA: Diagnosis not present

## 2021-01-07 DIAGNOSIS — F4010 Social phobia, unspecified: Secondary | ICD-10-CM

## 2021-01-07 DIAGNOSIS — F4001 Agoraphobia with panic disorder: Secondary | ICD-10-CM | POA: Diagnosis not present

## 2021-01-07 MED ORDER — LITHIUM CARBONATE 300 MG PO TABS: 300.0000 mg | ORAL_TABLET | Freq: Every evening | ORAL | 1 refills | Status: DC

## 2021-01-07 NOTE — Progress Notes (Signed)
Adam Harvey [id number] July 10, 1961 60 y.o.    Subjective:   Patient ID:  Adam Harvey is a 60 y.o. (DOB Jun 15, 1961) male.  Chief Complaint:  Chief Complaint  Patient presents with  . Follow-up  . Major depressive disorder, recurrent episode, moderate (HCC)  . Anxiety    Anxiety Symptoms include nervous/anxious behavior. Patient reports no chest pain, confusion, decreased concentration, nausea, palpitations, shortness of breath or suicidal ideas.    Medication Refill Pertinent negatives include no arthralgias, chest pain, fatigue, nausea or weakness.   Adam Harvey presents to the office today for follow-up of several diagnoses and wanted an earlier appointment because he wanted to restart duloxetine.  visit January 16, 2019.  He had wanted a trial off of antidepressants in part because of reading on the Internet about withdrawal symptoms.  He had been off antidepressants for 2 months.  And felt more anxious and depressed.  We discussed it and decided to return to duloxetine and increase to 60 mg daily.  visit in July after being back on the medication of duloxetine 60 mg daily he reported the following: It's great.  No depression but some anxiety that is managed.  He's 9/10 and satisfied.  Pleased with the dosage..  Tolerated well.  Sleep is good.  Better self care and better eating.  Working really hard but business is good.  In Holiday representative.  Location manager.  Energy better. Good appetite.  For years has had thoughts of if got terminal disease it would be easier and they are worse.  Hopelessness resolved. He had wanted to consider tapering off gabapentin and was given permission to do that if he chose to do so. He was continued on lamotrigine 150 mg daily.  visit October 16 2019.   Don't feel depressed and kind of happy but lingering thoughts of "is this as good as it gets?".  Not consuming him.  Not much excitement about things except work.  Doing design work for Teacher, early years/pre.  Some  anxiety about being around people. Not DT Covid unless it made it worse.  Prefer to be alone.  Did a lot of theater in the past but not excited like he used to be.  Likes yard work.  Not interested in dating.  Wants to talk about Gabapentin for pain.  Previously thought it helped.  Wants to try to stop it and see if he can do without it.  Knees don't hurt bc replaced. Plan:  Cont duloxetine 60 and add Abilify 5 Taper gabapentin off  12/25/19 appt, the following is noted: Anxiety not good.  Weary of dealing with it.  Had trouble dealing with number of people on the beach this weekend DT anxiety.  At times would like a BZ.  Always felt anxious but not managing it well.  Isolating. No SE gabapentin.  Did not stop it bc he forgot it. Plan: Increase gabapentin to 300 BID and 600 HS for 2 days, then incrase to 2 in AM  And HS and 1 in middle of the day for 4 days, then increase to 600 TID.  For a week, if NR then increase to 900  TID.    02/08/2020 appointment with the following noted: Increased gabapentin to 600 mg BID.  Has to nap if takes more.  No less anxious with the increase in dose.  No benefit to it. Asks about BZ.   Doesn't really want to leave the house and anxiety driving.  Can get anxious even in yard bc afraid  of having to talk to people.  No paranoia. Dread of things.  Emotional eating. Plan: Switch to paroxetine 30 mg for better antianxiety effect and hopefully help depression a little more.  Start one half of paroxetine 20 mg tablet and reduce duloxetine to 2 of the 20 mg capsules for 5 days, Then increase paroxetine to 1 tablet daily and reduce duloxetine to 1 capsule daily for 5 days, Then increase to 1-1/2 paroxetine tablets daily and stop duloxetine Continue Abilify 5 mg augmentation for residual anhedonia.   03/08/2020 phone call patient stating he felt Paxil was causing tremors in hands and feet and not helping with anxiety.  He was instructed to reduce paroxetine to 20 mg daily  and stop Abilify for 3 days and then restart Abilify 1/2 tablet. 03/20/2020 phone call patient stating the above changes made no difference in tremor.  He was instructed to stop Abilify and increase paroxetine to 40 mg daily. 04/01/2020 patient called stating his PCP wanted him to stop taking paroxetine completely to see if the tremors would resolve. 05/01/2020 patient phone call stating he was diagnosed with Parkinson's disease but because of anxiety needed to be on some medication he was encouraged to restart the paroxetine which had never had an adequate trial for his anxiety. 06/03/2020 phone call from patient complaining of panic attacks.  He had increase paroxetine to 40 mg a day and was given hydroxyzine as needed.  06/07/2020 urgent appointment with the following noted: Not well.  Depression as bad as ever and anxiety through the roof.  Worse beginning of the week bc feels completely overwhelming.   Getting OOB, brushing teeth is hard, being in public hard. Social avoidance affecting work.  Don't feel scared of PD but slap in the face about age.  Single and in middle of 5 year bankruptcy.  Trouble with concentration. Not suicidal but death thoughts. Yesterday a good day and laughed first time in a while. Poor appetite and lost weight. On paroxetine 40 mg for 3 weeks. No SE.   Trouble staying asleep. 4-5  Hours. Plan: Needs more time to respond to paroxetine 40 mg daily Deplin 15 mg daily for depression  With samples. Increase hydroxyzine for ST anxiety.  07/10/2020 appointment with the following noted: Depression is better but anxiety is through the roof.  Can laugh again.  Less hopeless and paralyzed by depression.  Now 5/10 depression.  Anxiety is still 10/10.  Will have nausea going out.  Has managed the anxiety pretty well.  Wonders if PD is causing anxiety.  Over the last 2-3 years more social anxiety.   No SE with Paxil. Hydroxyzine TID and 2 HS.  Lorazepam helps but manages it. Plan:  Increase paroxetine 60 mg daily.  It helped depression but not anxiety so far.  08/06/20 appt with following noted: Cant tell a difference.  Anxiety still up.  BP up with stress.  Takes Ativan prn. Started hydroxyzine 75 HS and sleeping better.  If EMA then hard to get back to sleep.  Not anxious at night typically.  Around people tense and heart races. I don't feel as depressed but no decorations for Xmas for the first time.  Typically after Jan 1 falls into depression and a bit anxious over it. Plan: Continue paroxetine 60 mg daily at least another month.  It helped depression but not anxiety so far. Increase propranolol to 60-80 mg TID for anxiety  09/05/2020 appointment with following noted: Still awakens terrified of the world and  hard to get OOB. Depression is still a bit of a problem. Going anywhere to deal with anyone makes him very anxious to the point of nausea.  When takes lorazepam it eases it off rather than eliminating it. Lorazepam 1 mg avg daily.  Really bad Xmas.  Even to the point of scared to go out with kids.  Once out he does ok usually. SE a little tired.  Not severe.   Hydroxyzine during day and 75 mg at night for sleep and it helps. No libido. Dx PD so can't use risperidone Plan: Increase paroxetine 80 mg daily at least another month.  It helped depression but not anxiety so far.  10/10/2020 appointment with the following noted: Anxiety no better.  Hopeless and "waiting to die".  No joy or relaxation around the corner. Lorazepam helps otherwise shaking and sweating.  Cancelled appts yesterday.  Likes there's something inside the body that's twisting. Plan no med changes.  We will give paroxetine 80 mg another month or so to help.  11/06/2020 appointment with the following noted: I still have pit in stomach but anxiety is better with less shaking sweating, SOB.  Still don't want to go out much.  Still a lot of worry.  Once home in PM then can let go easier.  Goes to bed  at 9 and sleeps good.  Normal function. No change in hopelessness.  No joy or looking forward.  I feel sad and lonely a good part of the time.  Enjoyed football game. Neuro yesterday doing well with early stage PD. Plan: Reduce paroxetine by 50% every 2 weeks down to 20 mg and stay at 20 mg. Start Viibryd 10 mg daily with 350 calories for 1 week then increase to 20 mg daily  12/10/20 appt noted:  Problem with cost Viibryd. Continued with Viibryd 20 mg daily and down to paroxetine 20 mg daily without withdrawal.  The terror of the outside world continues.  Still anxious around most other people and going out. For the first time in a long while,  over the last week, he felt joy and connection and he even made other people laugh.  Had less anxiety around them than usual.  Less hopeless.  Managing work better. Found copay card and last RX $45.   No SE and no withdrawal.  Plan:  paroxetine  stay at 20 mg until the increase in Viibryd can have benefit Increase Viibryd with 350 calories to 40 mg daily DC Deplin due to no response   01/07/2021 appointment with the following noted: The fear of the world remains but significantly less hopeless.  Very anxious over bankruptcy hearing today.  18 mos left on dealing with that. Son graduated from law-school recently.  He got 2 awards.  Left after he walked across the stage.  Couldn't stay in the crowd later.  Ongoing panic with agoraphobia.   Less death thoughts but still some. Works from house and stays there.   Tolerated Viibryd well.   NO SE Needs Klonopin and hydroxyzine.  Doing AA by internet.  Sober since 2006.  No history of BZ dependence. Asked about BZ for severe anxiety.  Past Psychiatric Medication Trials:  Brief duloxetine, sertraline 200, paroxetine 80, Viibryd 40 buspirone dizzy, gabapentin,  Lorazepam , clonazepam is better Hydroxyzine  Propranolol 40 BID Abilify helped the depression and sense of dread but nothing for anxiety. Dx  PD so can't use risperidone  Review of Systems:   Review of Systems  Constitutional:  Positive for unexpected weight change. Negative for fatigue.  Respiratory: Negative for chest tightness and shortness of breath.   Cardiovascular: Negative for chest pain and palpitations.  Gastrointestinal: Negative for abdominal distention and nausea.  Musculoskeletal: Negative for arthralgias.  Neurological: Positive for tremors. Negative for weakness.  Psychiatric/Behavioral: Positive for dysphoric mood. Negative for agitation, behavioral problems, confusion, decreased concentration, hallucinations, self-injury, sleep disturbance and suicidal ideas. The patient is nervous/anxious. The patient is not hyperactive.     Medications: I have reviewed the patient's current medications.  Current Outpatient Medications  Medication Sig Dispense Refill  . aspirin EC 81 MG tablet Take 1 tablet (81 mg total) by mouth 2 (two) times daily. (Patient taking differently: Take 81 mg by mouth daily.) 60 tablet 0  . carbidopa-levodopa (SINEMET IR) 25-100 MG tablet Take 1 tablet by mouth 3 (three) times daily before meals. 270 tablet 4  . clonazePAM (KLONOPIN) 1 MG tablet TAKE 1 TABLET(1 MG) BY MOUTH TWICE DAILY 60 tablet 2  . hydrOXYzine (ATARAX/VISTARIL) 25 MG tablet TAKE 1 TABLET BY MOUTH THREE TIMES DAILY AND 1-2 TABLETS AT BEDTIME AS NEEDED FOR ANXIETY/INSOMNIA 150 tablet 2  . lamoTRIgine (LAMICTAL) 150 MG tablet Take 1 tablet (150 mg total) by mouth at bedtime. 90 tablet 0  . lisinopril-hydrochlorothiazide (PRINZIDE,ZESTORETIC) 20-12.5 MG per tablet Take 1 tablet by mouth 2 (two) times daily.    . meloxicam (MOBIC) 15 MG tablet Take 15 mg by mouth daily.    . methocarbamol (ROBAXIN) 500 MG tablet Take 500 mg by mouth 3 (three) times daily.    . Multiple Vitamins-Minerals (MULTIVITAMIN WITH MINERALS) tablet Take 1 tablet by mouth daily.    Marland Kitchen omeprazole (PRILOSEC) 20 MG capsule Take 20 mg by mouth 2 (two) times daily.     Marland Kitchen PARoxetine (PAXIL) 40 MG tablet Take 0.5 tablets (20 mg total) by mouth daily. 45 tablet 0  . propranolol (INDERAL) 40 MG tablet Take 2 tablets (80 mg total) by mouth 2 (two) times daily. (Patient taking differently: Take 80 mg by mouth daily.) 360 tablet 0  . simvastatin (ZOCOR) 40 MG tablet Take 40 mg by mouth daily.    . Vilazodone HCl (VIIBRYD) 40 MG TABS Take 1 tablet (40 mg total) by mouth daily. TAKE 1/2 TABLET BY MOUTH DAILY FOR 7 DAYS, THEN TAKE 1 TABLET BY MOUTH DAILY 30 tablet 1   No current facility-administered medications for this visit.    Medication Side Effects: None  Allergies: No Known Allergies  Past Medical History:  Diagnosis Date  . Alcoholism (HCC)    sober since 01-2011   . Anxiety   . Arthritis   . Balance problem   . Complication of anesthesia    unsuccessful spinal with right knee replacement, used general anesthesia  . Depression   . Difficulty concentrating   . Diverticulosis 2012  . GERD (gastroesophageal reflux disease)   . Hemorrhoids   . Hyperlipidemia   . Hypertension   . Internal hemorrhoids 2012  . Obsessive compulsive disorder   . Osteoarthritis   . Rotator cuff tear, left   . Tear of right rotator cuff 08/03/2014  . Wears contact lenses     Family History  Problem Relation Age of Onset  . Stroke Mother   . Diabetes Mother   . Cancer Father   . Parkinson's disease Maternal Grandmother   . Colon cancer Neg Hx     Social History   Socioeconomic History  . Marital status: Divorced    Spouse  name: Not on file  . Number of children: 3  . Years of education: Not on file  . Highest education level: Bachelor's degree (e.g., BA, AB, BS)  Occupational History  . Occupation: Tree surgeon  Tobacco Use  . Smoking status: Former Smoker    Types: E-cigarettes    Quit date: 08/01/2009    Years since quitting: 11.4  . Smokeless tobacco: Never Used  Vaping Use  . Vaping Use: Every day  . Start date: 08/12/2014  Substance and  Sexual Activity  . Alcohol use: No    Comment: not since 2006  . Drug use: No    Comment: qut 1996  . Sexual activity: Not on file    Comment: uses e-cig  Other Topics Concern  . Not on file  Social History Narrative   Lives alone   No caffeine   Social Determinants of Health   Financial Resource Strain: Not on file  Food Insecurity: Not on file  Transportation Needs: Not on file  Physical Activity: Not on file  Stress: Not on file  Social Connections: Not on file  Intimate Partner Violence: Not on file    Past Medical History, Surgical history, Social history, and Family history were reviewed and updated as appropriate.   Please see review of systems for further details on the patient's review from today.   Objective:   Physical Exam:  There were no vitals taken for this visit.  Physical Exam Constitutional:      General: He is not in acute distress. Musculoskeletal:        General: No deformity.  Neurological:     Mental Status: He is alert and oriented to person, place, and time.     Cranial Nerves: No dysarthria.     Coordination: Coordination normal.  Psychiatric:        Attention and Perception: Attention and perception normal. He does not perceive auditory or visual hallucinations.        Mood and Affect: Mood is anxious and depressed. Affect is not labile, blunt, angry or inappropriate.        Speech: Speech normal.        Behavior: Behavior normal. Behavior is cooperative.        Thought Content: Thought content normal. Thought content is not paranoid or delusional. Thought content does not include homicidal or suicidal ideation. Thought content does not include homicidal or suicidal plan.        Cognition and Memory: Cognition and memory normal.        Judgment: Judgment normal.     Comments: Talkative. Ongoing severe sx are partly better Less hopeless     Lab Review:     Component Value Date/Time   NA 136 08/02/2018 0458   K 5.0 08/02/2018 0458    CL 102 08/02/2018 0458   CO2 27 08/02/2018 0458   GLUCOSE 138 (H) 08/02/2018 0458   BUN 26 (H) 08/02/2018 0458   CREATININE 1.10 08/02/2018 0458   CALCIUM 9.0 08/02/2018 0458   PROT 7.6 08/12/2017 1445   ALBUMIN 4.3 08/12/2017 1445   AST 29 08/12/2017 1445   ALT 26 08/12/2017 1445   ALKPHOS 60 08/12/2017 1445   BILITOT 1.0 08/12/2017 1445   GFRNONAA >60 08/02/2018 0458   GFRAA >60 08/02/2018 0458       Component Value Date/Time   WBC 14.6 (H) 08/02/2018 0458   RBC 4.06 (L) 08/02/2018 0458   HGB 12.9 (L) 08/02/2018 0458   HCT 37.8 (L)  08/02/2018 0458   PLT 205 08/02/2018 0458   MCV 93.1 08/02/2018 0458   MCH 31.8 08/02/2018 0458   MCHC 34.1 08/02/2018 0458   RDW 12.9 08/02/2018 0458   LYMPHSABS 1.4 07/26/2018 0954   MONOABS 0.6 07/26/2018 0954   EOSABS 0.2 07/26/2018 0954   BASOSABS 0.0 07/26/2018 0954    No results found for: POCLITH, LITHIUM   No results found for: PHENYTOIN, PHENOBARB, VALPROATE, CBMZ   .res Assessment: Plan:    Kayne was seen today for follow-up, major depressive disorder, recurrent episode, moderate (hcc) and anxiety.  Diagnoses and all orders for this visit:  Major depressive disorder, recurrent episode, moderate (HCC)  GAD (generalized anxiety disorder)  Panic disorder with agoraphobia  PTSD (post-traumatic stress disorder)  Social anxiety disorder  Alcohol dependence, in remission [facility])   Patient stated he has been on antidepressants very long time and wondered what his emotional state would be like off the medication.  This was partly influenced by online patient for him to described problems with withdrawal coming off of SSRIs.  He successfully came off antidepressant without withdrawal symptoms.  But then has had relapse of both depression and anxiety within a few weeks off the antidepressant.  SX have been severe and persistent.  paroxetine  stay at 20 mg until the increase in Viibryd can have benefit  continue Viibryd with  350 calories to 40 mg daily  Consider lithium, mirtazapine, Remeron, trazodone, Deplin  Lithium augmentation 300  HS .  Low DT low dose HCTZ and disc DDI  Continue propranolol to 60-80 mg TID for anxiety  Klonopin 1 mg BID  Disc risk triggering alcohol abuse.  He wants it bc feels desperate.  Disc fall risk.  We discussed the short-term risks associated with benzodiazepines including sedation and increased fall risk among others.  Discussed long-term side effect risk including dependence, potential withdrawal symptoms, and the potential eventual dose-related risk of dementia.  But recent studies from 2020 dispute this association between benzodiazepines and dementia risk. Newer studies in 2020 do not support an association with dementia. He doesn't notice SE  hydroxyzine 25 mg TID prn anxiety  And 1-2 hs prn insomnia. Continue gabapentin.    Discussed his chronic thoughts about death. They resolved so far.  He commits to safety and is not having suicidal thoughts.  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   Disc BZ.   Extensive discussion of how Covid has allowed social anxiety to get worse but is functional.   Genesight DT TR sx.  Reviewed at length.   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  Continue AA.    Follow-up 4 weeks  Meredith Staggers, MD, DFAPA     Please see After Visit Summary for patient specific instructions.  Future Appointments  Date Time Provider Department Center  02/06/2021  1:30 PM Cottle, Steva Ready., MD [facility] None  11/11/2021 11:30 AM Penumalli, Glenford Bayley, MD [facility] None    No orders of the defined types were placed in this encounter.     -------------------------------

## 2021-01-19 ENCOUNTER — Other Ambulatory Visit: Payer: Self-pay | Admitting: Psychiatry

## 2021-01-31 ENCOUNTER — Other Ambulatory Visit: Payer: Self-pay | Admitting: Psychiatry

## 2021-01-31 DIAGNOSIS — F4010 Social phobia, unspecified: Secondary | ICD-10-CM

## 2021-01-31 DIAGNOSIS — F4001 Agoraphobia with panic disorder: Secondary | ICD-10-CM

## 2021-01-31 DIAGNOSIS — F411 Generalized anxiety disorder: Secondary | ICD-10-CM

## 2021-02-06 ENCOUNTER — Ambulatory Visit (INDEPENDENT_AMBULATORY_CARE_PROVIDER_SITE_OTHER): Payer: 59 | Admitting: Psychiatry

## 2021-02-06 ENCOUNTER — Encounter: Payer: Self-pay | Admitting: Psychiatry

## 2021-02-06 ENCOUNTER — Other Ambulatory Visit: Payer: Self-pay

## 2021-02-06 DIAGNOSIS — F4310 Post-traumatic stress disorder, unspecified: Secondary | ICD-10-CM

## 2021-02-06 DIAGNOSIS — F4010 Social phobia, unspecified: Secondary | ICD-10-CM

## 2021-02-06 DIAGNOSIS — F4001 Agoraphobia with panic disorder: Secondary | ICD-10-CM

## 2021-02-06 DIAGNOSIS — F332 Major depressive disorder, recurrent severe without psychotic features: Secondary | ICD-10-CM | POA: Diagnosis not present

## 2021-02-06 DIAGNOSIS — F411 Generalized anxiety disorder: Secondary | ICD-10-CM | POA: Diagnosis not present

## 2021-02-06 DIAGNOSIS — F1021 Alcohol dependence, in remission: Secondary | ICD-10-CM

## 2021-02-06 MED ORDER — VILAZODONE HCL 40 MG PO TABS
60.0000 mg | ORAL_TABLET | Freq: Every day | ORAL | 1 refills | Status: DC
Start: 2021-02-06 — End: 2021-02-18

## 2021-02-06 NOTE — Patient Instructions (Signed)
Reduce Paxil to 10 mg for 2 weeks and stop it Increase Viibryd to 60 mg daily for better anxiety effects

## 2021-02-06 NOTE — Progress Notes (Signed)
Adam Harvey Harvey [id number] 04-Jun-1961 60 y.o.    Subjective:   Patient ID:  Adam Harvey State is a 60 y.o. (DOB 04-Jun-1961) male.  Chief Complaint:  Chief Complaint  Patient presents with   Follow-up   Depression   Anxiety    Anxiety Symptoms include nervous/anxious behavior. Patient reports no chest pain, confusion, decreased concentration, nausea, palpitations, shortness of breath or suicidal ideas.    Medication Refill Pertinent negatives include no arthralgias, chest pain, fatigue, nausea or weakness.  Adam Harvey presents to the office today for follow-up of several diagnoses and wanted an earlier appointment because he wanted to restart duloxetine.  visit January 16, 2019.  He had wanted a trial off of antidepressants in part because of reading on the Internet about withdrawal symptoms.  He had been off antidepressants for 2 months.  And felt more anxious and depressed.  We discussed it and decided to return to duloxetine and increase to 60 mg daily.  visit in July after being back on the medication of duloxetine 60 mg daily he reported the following: It's great.  No depression but some anxiety that is managed.  He's 9/10 and satisfied.  Pleased with the dosage..  Tolerated well.  Sleep is good.  Better self care and better eating.  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  Energy better. Good appetite.  For years has had thoughts of if got terminal disease it would be easier and they are worse.  Hopelessness resolved. He had wanted to consider tapering off gabapentin and was given permission to do that if he chose to do so. He was continued on lamotrigine 150 mg daily.  visit October 16 2019.   Don't feel depressed and kind of happy but lingering thoughts of "is this as good as it gets?".  Not consuming him.  Not much excitement about things except work.  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  Some anxiety about being around people. Not DT Covid unless it  made it worse.  Prefer to be alone.  Did a lot of theater in the past but not excited like he used to be.  Likes yard work.  Not interested in dating.  Wants to talk about Gabapentin for pain.  Previously thought it helped.  Wants to try to stop it and see if he can do without it.  Knees don't hurt bc replaced. Plan:  Cont duloxetine 60 and add Abilify 5 Taper gabapentin off  12/25/19 appt, the following is noted: Anxiety not good.  Weary of dealing with it.  Had trouble dealing with number of people on the beach this weekend DT anxiety.  At times would like a BZ.  Always felt anxious but not managing it well.  Isolating. No SE gabapentin.  Did not stop it bc he forgot it. Plan: Increase gabapentin to 300 BID and 600 HS for 2 days, then incrase to 2 in AM  And HS and 1 in middle of the day for 4 days, then increase to 600 TID.  For a week, if NR then increase to 900  TID.    02/08/2020 appointment with the following noted: Increased gabapentin to 600 mg BID.  Has to nap if takes more.  No less anxious with the increase in dose.  No benefit to it. Asks about BZ.   Doesn't really want to leave the house and anxiety driving.  Can get anxious even in yard bc afraid of having to talk to people.  No paranoia. Dread of things.  Emotional eating. Plan: Switch to paroxetine 30 mg for better antianxiety effect and hopefully help depression a little more.  Start one half of paroxetine 20 mg tablet and reduce duloxetine to 2 of the 20 mg capsules for 5 days, Then increase paroxetine to 1 tablet daily and reduce duloxetine to 1 capsule daily for 5 days, Then increase to 1-1/2 paroxetine tablets daily and stop duloxetine Continue Abilify 5 mg augmentation for residual anhedonia.   03/08/2020 phone call patient stating he felt Paxil was causing tremors in hands and feet and not helping with anxiety.  He was instructed to reduce paroxetine to 20 mg daily and stop Abilify for 3 days and then restart Abilify 1/2  tablet. 03/20/2020 phone call patient stating the above changes made no difference in tremor.  He was instructed to stop Abilify and increase paroxetine to 40 mg daily. 04/01/2020 patient called stating his PCP wanted him to stop taking paroxetine completely to see if the tremors would resolve. 05/01/2020 patient phone call stating he was diagnosed with Parkinson's disease but because of anxiety needed to be on some medication he was encouraged to restart the paroxetine which had never had an adequate trial for his anxiety. 06/03/2020 phone call from patient complaining of panic attacks.  He had increase paroxetine to 40 mg a day and was given hydroxyzine as needed.  06/07/2020 urgent appointment with the following noted: Not well.  Depression as bad as ever and anxiety through the roof.  Worse beginning of the week bc feels completely overwhelming.   Getting OOB, brushing teeth is hard, being in public hard. Social avoidance affecting work.  Don't feel scared of PD but slap in the face about age.  Single and in middle of 5 year bankruptcy.  Trouble with concentration. Not suicidal but death thoughts. Yesterday a good day and laughed first time in a while. Poor appetite and lost weight. On paroxetine 40 mg for 3 weeks. No SE.   Trouble staying asleep. 4-5  Hours. Plan: Needs more time to respond to paroxetine 40 mg daily Deplin 15 mg daily for depression  With samples. Increase hydroxyzine for ST anxiety.  07/10/2020 appointment with the following noted: Depression is better but anxiety is through the roof.  Can laugh again.  Less hopeless and paralyzed by depression.  Now 5/10 depression.  Anxiety is still 10/10.  Will have nausea going out.  Has managed the anxiety pretty well.  Wonders if PD is causing anxiety.  Over the last 2-3 years more social anxiety.   No SE with Paxil. Hydroxyzine TID and 2 HS.  Lorazepam helps but manages it. Plan: Increase paroxetine 60 mg daily.  It helped depression but  not anxiety so far.  08/06/20 appt with following noted: Cant tell a difference.  Anxiety still up.  BP up with stress.  Takes Ativan prn. Started hydroxyzine 75 HS and sleeping better.  If EMA then hard to get back to sleep.  Not anxious at night typically.  Around people tense and heart races. I don't feel as depressed but no decorations for Xmas for the first time.  Typically after Jan 1 falls into depression and a bit anxious over it. Plan: Continue paroxetine 60 mg daily at least another month.  It helped depression but not anxiety so far. Increase propranolol to 60-80 mg TID for anxiety  09/05/2020 appointment with following noted: Still awakens terrified of the world and hard to get OOB. Depression is still  a bit of a problem. Going anywhere to deal with anyone makes him very anxious to the point of nausea.  When takes lorazepam it eases it off rather than eliminating it. Lorazepam 1 mg avg daily.  Really bad Xmas.  Even to the point of scared to go out with kids.  Once out he does ok usually. SE a little tired.  Not severe.   Hydroxyzine during day and 75 mg at night for sleep and it helps. No libido. Dx PD so can't use risperidone Plan: Increase paroxetine 80 mg daily at least another month.  It helped depression but not anxiety so far.  10/10/2020 appointment with the following noted: Anxiety no better.  Hopeless and "waiting to die".  No joy or relaxation around the corner. Lorazepam helps otherwise shaking and sweating.  Cancelled appts yesterday.  Likes there's something inside the body that's twisting. Plan no med changes.  We will give paroxetine 80 mg another month or so to help.  11/06/2020 appointment with the following noted: I still have pit in stomach but anxiety is better with less shaking sweating, SOB.  Still don't want to go out much.  Still a lot of worry.  Once home in PM then can let go easier.  Goes to bed at 9 and sleeps good.  Normal function. No change in  hopelessness.  No joy or looking forward.  I feel sad and lonely a good part of the time.  Enjoyed football game. Neuro yesterday doing well with early stage PD. Plan: Reduce paroxetine by 50% every 2 weeks down to 20 mg and stay at 20 mg. Start Viibryd 10 mg daily with 350 calories for 1 week then increase to 20 mg daily  12/10/20 appt noted:  Problem with cost Viibryd. Continued with Viibryd 20 mg daily and down to paroxetine 20 mg daily without withdrawal.  The terror of the outside world continues.  Still anxious around most other people and going out. For the first time in a long while,  over the last week, he felt joy and connection and he even made other people laugh.  Had less anxiety around them than usual.  Less hopeless.  Managing work better. Found copay card and last RX $45.   No SE and no withdrawal.  Plan:  paroxetine  stay at 20 mg until the increase in Viibryd can have benefit Increase Viibryd with 350 calories to 40 mg daily DC Deplin due to no response   01/07/2021 appointment with the following noted: The fear of the world remains but significantly less hopeless.  Very anxious over bankruptcy hearing today.  18 mos left on dealing with that. Son graduated from law-school recently.  He got 2 awards.  Left after he walked across the stage.  Couldn't stay in the crowd later.  Ongoing panic with agoraphobia.   Less death thoughts but still some. Works from house and stays there.   Tolerated Viibryd well.   NO SE Needs Klonopin and hydroxyzine. Plan: paroxetine  stay at 20 mg until the increase in Viibryd can have benefit continue Viibryd with 350 calories to 40 mg daily  02/06/21 appt with following noted: Viibryd 40 and paxil 20. Stopped lithium after 3 weeks DT diarrhea and NR. Managing but not great.  Not dramatic improvement but thinks overall it is better than with Paxil.  Less depressed than 6 mos ago.  But anxiety over normal activities even in grocery store or  waiting room. Working hard.  Managing  meetings.    Doing AA by internet.  Sober since 2006.  No history of BZ dependence. Asked about BZ for severe anxiety.  Past Psychiatric Medication Trials:  Brief duloxetine, sertraline 200, paroxetine 80, Viibryd 40 buspirone dizzy, gabapentin,  Lorazepam , clonazepam is better Hydroxyzine  Propranolol 40 BID Abilify helped the depression and sense of dread but nothing for anxiety. Dx PD so can't use risperidone  Review of Systems:   Review of Systems  Constitutional:  Positive for unexpected weight change. Negative for fatigue.  Respiratory:  Negative for chest tightness and shortness of breath.   Cardiovascular:  Negative for chest pain and palpitations.  Gastrointestinal:  Negative for abdominal distention, diarrhea and nausea.  Musculoskeletal:  Negative for arthralgias.  Neurological:  Positive for tremors. Negative for weakness.  Psychiatric/Behavioral:  Positive for dysphoric mood. Negative for agitation, behavioral problems, confusion, decreased concentration, hallucinations, self-injury, sleep disturbance and suicidal ideas. The patient is nervous/anxious. The patient is not hyperactive.    Medications: I have reviewed the patient's current medications.  Current Outpatient Medications  Medication Sig Dispense Refill   aspirin EC 81 MG tablet Take 1 tablet (81 mg total) by mouth 2 (two) times daily. (Patient taking differently: Take 81 mg by mouth daily.) 60 tablet 0   carbidopa-levodopa (SINEMET IR) 25-100 MG tablet Take 1 tablet by mouth 3 (three) times daily before meals. 270 tablet 4   clonazePAM (KLONOPIN) 1 MG tablet TAKE 1 TABLET(1 MG) BY MOUTH TWICE DAILY 60 tablet 0   hydrOXYzine (ATARAX/VISTARIL) 25 MG tablet TAKE 1 TABLET BY MOUTH THREE TIMES DAILY AND 1-2 TABLETS AT BEDTIME AS NEEDED FOR ANXIETY/INSOMNIA 150 tablet 2   lamoTRIgine (LAMICTAL) 150 MG tablet TAKE 1 TABLET(150 MG) BY MOUTH AT BEDTIME 90 tablet 0    lisinopril-hydrochlorothiazide (PRINZIDE,ZESTORETIC) 20-12.5 MG per tablet Take 1 tablet by mouth 2 (two) times daily.     meloxicam (MOBIC) 15 MG tablet Take 15 mg by mouth daily.     methocarbamol (ROBAXIN) 500 MG tablet Take 500 mg by mouth 3 (three) times daily.     Multiple Vitamins-Minerals (MULTIVITAMIN WITH MINERALS) tablet Take 1 tablet by mouth daily.     omeprazole (PRILOSEC) 20 MG capsule Take 20 mg by mouth 2 (two) times daily.     PARoxetine (PAXIL) 40 MG tablet Take 0.5 tablets (20 mg total) by mouth daily. 45 tablet 0   propranolol (INDERAL) 40 MG tablet Take 2 tablets (80 mg total) by mouth 2 (two) times daily. (Patient taking differently: Take 80 mg by mouth daily.) 360 tablet 0   simvastatin (ZOCOR) 40 MG tablet Take 40 mg by mouth daily.     lithium 300 MG tablet Take 1 tablet (300 mg total) by mouth at bedtime. (Patient not taking: Reported on 02/06/2021) 30 tablet 1   Vilazodone HCl (VIIBRYD) 40 MG TABS Take 1.5 tablets (60 mg total) by mouth daily. 30 tablet 1   No current facility-administered medications for this visit.    Medication Side Effects: None  Allergies: No Known Allergies  Past Medical History:  Diagnosis Date   Alcoholism (HCC)    sober since 01-2011    Anxiety    Arthritis    Balance problem    Complication of anesthesia    unsuccessful spinal with right knee replacement, used general anesthesia   Depression    Difficulty concentrating    Diverticulosis 2012   GERD (gastroesophageal reflux disease)    Hemorrhoids    Hyperlipidemia    Hypertension  Internal hemorrhoids 2012   Obsessive compulsive disorder    Osteoarthritis    Rotator cuff tear, left    Tear of right rotator cuff 08/03/2014   Wears contact lenses     Family History  Problem Relation Age of Onset   Stroke Mother    Diabetes Mother    Cancer Father    Parkinson's disease Maternal Grandmother    Colon cancer Neg Hx     Social History   Socioeconomic History    Marital status: Divorced    Spouse name: Not on file   Number of children: 3   Years of education: Not on file   Highest education level: Bachelor's degree (e.g., BA, AB, BS)  Occupational History   Occupation: Tree surgeon  Tobacco Use   Smoking status: Former    Pack years: 0.00    Types: E-cigarettes    Quit date: 08/01/2009    Years since quitting: 11.5   Smokeless tobacco: Never  Vaping Use   Vaping Use: Every day   Start date: 08/12/2014  Substance and Sexual Activity   Alcohol use: No    Comment: not since 2006   Drug use: No    Comment: qut 1996   Sexual activity: Not on file    Comment: uses e-cig  Other Topics Concern   Not on file  Social History Narrative   Lives alone   No caffeine   Social Determinants of Health   Financial Resource Strain: Not on file  Food Insecurity: Not on file  Transportation Needs: Not on file  Physical Activity: Not on file  Stress: Not on file  Social Connections: Not on file  Intimate Partner Violence: Not on file    Past Medical History, Surgical history, Social history, and Family history were reviewed and updated as appropriate.   Please see review of systems for further details on the patient's review from today.   Objective:   Physical Exam:  There were no vitals taken for this visit.  Physical Exam Constitutional:      General: He is not in acute distress. Musculoskeletal:        General: No deformity.  Neurological:     Mental Status: He is alert and oriented to person, place, and time.     Cranial Nerves: No dysarthria.     Coordination: Coordination normal.  Psychiatric:        Attention and Perception: Attention and perception normal. He does not perceive auditory or visual hallucinations.        Mood and Affect: Mood is anxious and depressed. Affect is not labile, blunt, angry or inappropriate.        Speech: Speech normal.        Behavior: Behavior normal. Behavior is cooperative.        Thought  Content: Thought content normal. Thought content is not paranoid or delusional. Thought content does not include homicidal or suicidal ideation. Thought content does not include homicidal or suicidal plan.        Cognition and Memory: Cognition and memory normal.        Judgment: Judgment normal.     Comments: Talkative. Ongoing severe sx are partly better. Anxiety worse than depression Less hopeless    Lab Review:     Component Value Date/Time   NA 136 08/02/2018 0458   K 5.0 08/02/2018 0458   CL 102 08/02/2018 0458   CO2 27 08/02/2018 0458   GLUCOSE 138 (H) 08/02/2018 0458   BUN  26 (H) 08/02/2018 0458   CREATININE 1.10 08/02/2018 0458   CALCIUM 9.0 08/02/2018 0458   PROT 7.6 08/12/2017 1445   ALBUMIN 4.3 08/12/2017 1445   AST 29 08/12/2017 1445   ALT 26 08/12/2017 1445   ALKPHOS 60 08/12/2017 1445   BILITOT 1.0 08/12/2017 1445   GFRNONAA >60 08/02/2018 0458   GFRAA >60 08/02/2018 0458       Component Value Date/Time   WBC 14.6 (H) 08/02/2018 0458   RBC 4.06 (L) 08/02/2018 0458   HGB 12.9 (L) 08/02/2018 0458   HCT 37.8 (L) 08/02/2018 0458   PLT 205 08/02/2018 0458   MCV 93.1 08/02/2018 0458   MCH 31.8 08/02/2018 0458   MCHC 34.1 08/02/2018 0458   RDW 12.9 08/02/2018 0458   LYMPHSABS 1.4 07/26/2018 0954   MONOABS 0.6 07/26/2018 0954   EOSABS 0.2 07/26/2018 0954   BASOSABS 0.0 07/26/2018 0954    No results found for: POCLITH, LITHIUM   No results found for: PHENYTOIN, PHENOBARB, VALPROATE, CBMZ   .res Assessment: Plan:    Adam Harvey was seen today for follow-up, depression and anxiety.  Diagnoses and all orders for this visit:  Moderately severe recurrent major depression (HCC)  GAD (generalized anxiety disorder)  Panic disorder with agoraphobia  PTSD (post-traumatic stress disorder)  Social anxiety disorder  Alcohol dependence, in remission (HCC)  Other orders -     Vilazodone HCl (VIIBRYD) 40 MG TABS; Take 1.5 tablets (60 mg total) by mouth  daily.  Patient stated he has been on antidepressants very long time and wondered what his emotional state would be like off the medication.  This was partly influenced by online patient for him to described problems with withdrawal coming off of SSRIs.  He successfully came off antidepressant without withdrawal symptoms.  But then has had relapse of both depression and anxiety within a few weeks off the antidepressant.  SX have been severe and persistent.  Reduce Paxil to 10 mg for 2 weeks and stop it Increase Viibryd to 60 mg daily for better anxiety effects  Consider lithium, mirtazapine, Remeron, trazodone, Deplin .  If viibryd fails trial TCA Lithium augmentation 300  HS .  Failed to help after 3 weeks.  Continue propranolol to 60-80 mg TID for anxiety  Klonopin 1 mg BID  Disc risk triggering alcohol abuse.  He wants it bc feels desperate.  Disc fall risk.  We discussed the short-term risks associated with benzodiazepines including sedation and increased fall risk among others.  Discussed long-term side effect risk including dependence, potential withdrawal symptoms, and the potential eventual dose-related risk of dementia.  But recent studies from 2020 dispute this association between benzodiazepines and dementia risk. Newer studies in 2020 do not support an association with dementia. He doesn't notice SE  hydroxyzine 25 mg TID prn anxiety  And 1-2 hs prn insomnia. Continue gabapentin.    Discussed his chronic thoughts about death. They resolved so far.  He commits to safety and is not having suicidal thoughts.  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   Disc BZ.   Extensive discussion of how Covid has allowed social anxiety to get worse but is functional.   Genesight DT TR sx.  Reviewed results at length.  Will need to do so next appt also.   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  Continue AA.    Follow-up 8 weeks  Meredith Staggers, MD,  DFAPA     Please see After Visit Summary  for patient specific instructions.  Future Appointments  Date Time Provider Department Center  04/03/2021  1:30 PM Cottle, Steva Ready., MD [facility] None  11/11/2021 11:30 AM Penumalli, Glenford Bayley, MD [facility] None    No orders of the defined types were placed in this encounter.     -------------------------------

## 2021-02-07 ENCOUNTER — Encounter: Payer: Self-pay | Admitting: Psychiatry

## 2021-02-08 ENCOUNTER — Other Ambulatory Visit: Payer: Self-pay | Admitting: Psychiatry

## 2021-02-08 DIAGNOSIS — F411 Generalized anxiety disorder: Secondary | ICD-10-CM

## 2021-02-08 DIAGNOSIS — F4310 Post-traumatic stress disorder, unspecified: Secondary | ICD-10-CM

## 2021-02-08 DIAGNOSIS — F4001 Agoraphobia with panic disorder: Secondary | ICD-10-CM

## 2021-02-12 NOTE — Telephone Encounter (Signed)
Pt called to check on the status of this medicine. Please send it in

## 2021-02-18 ENCOUNTER — Other Ambulatory Visit: Payer: Self-pay | Admitting: Psychiatry

## 2021-02-18 ENCOUNTER — Telehealth: Payer: Self-pay | Admitting: Psychiatry

## 2021-02-18 DIAGNOSIS — F411 Generalized anxiety disorder: Secondary | ICD-10-CM

## 2021-02-18 DIAGNOSIS — F4001 Agoraphobia with panic disorder: Secondary | ICD-10-CM

## 2021-02-18 DIAGNOSIS — F4010 Social phobia, unspecified: Secondary | ICD-10-CM

## 2021-02-18 MED ORDER — VILAZODONE HCL 40 MG PO TABS
60.0000 mg | ORAL_TABLET | Freq: Every day | ORAL | 1 refills | Status: DC
Start: 2021-02-18 — End: 2021-02-25

## 2021-02-18 MED ORDER — CLONAZEPAM 1 MG PO TABS: 1.0000 mg | ORAL_TABLET | Freq: Two times a day (BID) | ORAL | 1 refills | Status: DC

## 2021-02-18 NOTE — Telephone Encounter (Signed)
Both prescriptions sent for Viibryd and clonazepam

## 2021-02-18 NOTE — Telephone Encounter (Signed)
Can you please send viibryd and he asked me the same question last week I let him know that I'm sure you will make sure he has his klonopin before you leave

## 2021-02-18 NOTE — Telephone Encounter (Signed)
Pt called and would like to have his klonopin sent in before dr. Dorena Bodo on vacation and that way it is there when he needs to get it. Also, His viibryd script went to print instead of being escribed. Please resend

## 2021-02-25 ENCOUNTER — Other Ambulatory Visit: Payer: Self-pay | Admitting: Psychiatry

## 2021-02-25 ENCOUNTER — Telehealth: Payer: Self-pay

## 2021-02-25 MED ORDER — VILAZODONE HCL 20 MG PO TABS: 20.0000 mg | ORAL_TABLET | Freq: Every day | ORAL | 1 refills | Status: DC

## 2021-02-25 MED ORDER — VILAZODONE HCL 40 MG PO TABS
40.0000 mg | ORAL_TABLET | Freq: Every day | ORAL | 1 refills | Status: DC
Start: 2021-02-25 — End: 2021-05-19

## 2021-02-25 NOTE — Telephone Encounter (Signed)
Prior Authorization submitted for Vilazodone 40 mg 1.5 tablets daily with [facility], received a denial on 07/06 due to the medication not being a covered benefit and excluded from coverage. It was administratively DENIED.  Contacted [facility] to see if anything could be done. Pt previously has been taking VIIBRYD with an approval. Representative suggested submitted the Brand again. The dose has been increased to 40 mg VIIBRYD take 1.5 tablets daily. PA submitted over the phone.  Received fax with DENIAL again with requiring the pt try and fail 4 of the following medications: Citalopram Escitalopram Fluoxetine Venlafaxine ER Vilazodone.   The medication can not be approved for the quantity of #45 until criteria is addressed per [facility].

## 2021-02-25 NOTE — Telephone Encounter (Signed)
His insurance was previously covering Viibryd.  May be the primary problem now is because it is written for 1-1/2 tablets.  I sent in a new prescription for the generic 40 mg plus the generic 20 mg.  Hopefully that will go through

## 2021-02-26 NOTE — Telephone Encounter (Signed)
Noted. Will follow up.

## 2021-04-03 ENCOUNTER — Ambulatory Visit (INDEPENDENT_AMBULATORY_CARE_PROVIDER_SITE_OTHER): Payer: 59 | Admitting: Psychiatry

## 2021-04-03 ENCOUNTER — Other Ambulatory Visit: Payer: Self-pay

## 2021-04-03 ENCOUNTER — Encounter: Payer: Self-pay | Admitting: Psychiatry

## 2021-04-03 DIAGNOSIS — F4001 Agoraphobia with panic disorder: Secondary | ICD-10-CM | POA: Diagnosis not present

## 2021-04-03 DIAGNOSIS — F411 Generalized anxiety disorder: Secondary | ICD-10-CM | POA: Diagnosis not present

## 2021-04-03 DIAGNOSIS — F4010 Social phobia, unspecified: Secondary | ICD-10-CM | POA: Diagnosis not present

## 2021-04-03 DIAGNOSIS — F4310 Post-traumatic stress disorder, unspecified: Secondary | ICD-10-CM

## 2021-04-03 DIAGNOSIS — F332 Major depressive disorder, recurrent severe without psychotic features: Secondary | ICD-10-CM | POA: Diagnosis not present

## 2021-04-03 DIAGNOSIS — F1021 Alcohol dependence, in remission: Secondary | ICD-10-CM

## 2021-04-03 MED ORDER — NORTRIPTYLINE HCL 25 MG PO CAPS: ORAL_CAPSULE | ORAL | 1 refills | Status: DC

## 2021-04-03 NOTE — Progress Notes (Signed)
[name] [id number] July 02, 1961 60 y.o.    Subjective:   Patient ID:  Adam Harvey is a 60 y.o. (DOB February 27, 1961) male.  Chief Complaint:  Chief Complaint  Patient presents with  . Follow-up  . Anxiety  . Depression    Anxiety Symptoms include nervous/anxious behavior. Patient reports no chest pain, confusion, decreased concentration, nausea, palpitations or suicidal ideas.    Medication Refill Pertinent negatives include no arthralgias, chest pain, fatigue, nausea or weakness.  [name] presents to the office today for follow-up of several diagnoses and wanted an earlier appointment because he wanted to restart duloxetine.  visit January 16, 2019.  He had wanted a trial off of antidepressants in part because of reading on the Internet about withdrawal symptoms.  He had been off antidepressants for 2 months.  And felt more anxious and depressed.  We discussed it and decided to return to duloxetine and increase to 60 mg daily.  visit in July after being back on the medication of duloxetine 60 mg daily he reported the following: It's great.  No depression but some anxiety that is managed.  He's 9/10 and satisfied.  Pleased with the dosage..  Tolerated well.  Sleep is good.  Better self care and better eating.  Working really hard but business is good.  In Holiday representative.  Location manager.  Energy better. Good appetite.  For years has had thoughts of if got terminal disease it would be easier and they are worse.  Hopelessness resolved. He had wanted to consider tapering off gabapentin and was given permission to do that if he chose to do so. He was continued on lamotrigine 150 mg daily.  visit October 16 2019.   Don't feel depressed and kind of happy but lingering thoughts of "is this as good as it gets?".  Not consuming him.  Not much excitement about things except work.  Doing design work for Teacher, early years/pre.  Some anxiety about being around people. Not DT Covid unless it made it worse.   Prefer to be alone.  Did a lot of theater in the past but not excited like he used to be.  Likes yard work.  Not interested in dating.  Wants to talk about Gabapentin for pain.  Previously thought it helped.  Wants to try to stop it and see if he can do without it.  Knees don't hurt bc replaced. Plan:  Cont duloxetine 60 and add Abilify 5 Taper gabapentin off  12/25/19 appt, the following is noted: Anxiety not good.  Weary of dealing with it.  Had trouble dealing with number of people on the beach this weekend DT anxiety.  At times would like a BZ.  Always felt anxious but not managing it well.  Isolating. No SE gabapentin.  Did not stop it bc he forgot it. Plan: Increase gabapentin to 300 BID and 600 HS for 2 days, then incrase to 2 in AM  And HS and 1 in middle of the day for 4 days, then increase to 600 TID.  For a week, if NR then increase to 900  TID.    02/08/2020 appointment with the following noted: Increased gabapentin to 600 mg BID.  Has to nap if takes more.  No less anxious with the increase in dose.  No benefit to it. Asks about BZ.   Doesn't really want to leave the house and anxiety driving.  Can get anxious even in yard bc afraid of having to talk to people.  No paranoia. Dread  of things.  Emotional eating. Plan: Switch to paroxetine 30 mg for better antianxiety effect and hopefully help depression a little more.  Start one half of paroxetine 20 mg tablet and reduce duloxetine to 2 of the 20 mg capsules for 5 days, Then increase paroxetine to 1 tablet daily and reduce duloxetine to 1 capsule daily for 5 days, Then increase to 1-1/2 paroxetine tablets daily and stop duloxetine Continue Abilify 5 mg augmentation for residual anhedonia.   03/08/2020 phone call patient stating he felt Paxil was causing tremors in hands and feet and not helping with anxiety.  He was instructed to reduce paroxetine to 20 mg daily and stop Abilify for 3 days and then restart Abilify 1/2 tablet. 03/20/2020  phone call patient stating the above changes made no difference in tremor.  He was instructed to stop Abilify and increase paroxetine to 40 mg daily. 04/01/2020 patient called stating his PCP wanted him to stop taking paroxetine completely to see if the tremors would resolve. 05/01/2020 patient phone call stating he was diagnosed with Parkinson's disease but because of anxiety needed to be on some medication he was encouraged to restart the paroxetine which had never had an adequate trial for his anxiety. 06/03/2020 phone call from patient complaining of panic attacks.  He had increase paroxetine to 40 mg a day and was given hydroxyzine as needed.  06/07/2020 urgent appointment with the following noted: Not well.  Depression as bad as ever and anxiety through the roof.  Worse beginning of the week bc feels completely overwhelming.   Getting OOB, brushing teeth is hard, being in public hard. Social avoidance affecting work.  Don't feel scared of PD but slap in the face about age.  Single and in middle of 5 year bankruptcy.  Trouble with concentration. Not suicidal but death thoughts. Yesterday a good day and laughed first time in a while. Poor appetite and lost weight. On paroxetine 40 mg for 3 weeks. No SE.   Trouble staying asleep. 4-5  Hours. Plan: Needs more time to respond to paroxetine 40 mg daily Deplin 15 mg daily for depression  With samples. Increase hydroxyzine for ST anxiety.  07/10/2020 appointment with the following noted: Depression is better but anxiety is through the roof.  Can laugh again.  Less hopeless and paralyzed by depression.  Now 5/10 depression.  Anxiety is still 10/10.  Will have nausea going out.  Has managed the anxiety pretty well.  Wonders if PD is causing anxiety.  Over the last 2-3 years more social anxiety.   No SE with Paxil. Hydroxyzine TID and 2 HS.  Lorazepam helps but manages it. Plan: Increase paroxetine 60 mg daily.  It helped depression but not anxiety so  far.  08/06/20 appt with following noted: Cant tell a difference.  Anxiety still up.  BP up with stress.  Takes Ativan prn. Started hydroxyzine 75 HS and sleeping better.  If EMA then hard to get back to sleep.  Not anxious at night typically.  Around people tense and heart races. I don't feel as depressed but no decorations for Xmas for the first time.  Typically after Jan 1 falls into depression and a bit anxious over it. Plan: Continue paroxetine 60 mg daily at least another month.  It helped depression but not anxiety so far. Increase propranolol to 60-80 mg TID for anxiety  09/05/2020 appointment with following noted: Still awakens terrified of the world and hard to get OOB. Depression is still a bit of  a problem. Going anywhere to deal with anyone makes him very anxious to the point of nausea.  When takes lorazepam it eases it off rather than eliminating it. Lorazepam 1 mg avg daily.  Really bad Xmas.  Even to the point of scared to go out with kids.  Once out he does ok usually. SE a little tired.  Not severe.   Hydroxyzine during day and 75 mg at night for sleep and it helps. No libido. Dx PD so can't use risperidone Plan: Increase paroxetine 80 mg daily at least another month.  It helped depression but not anxiety so far.  10/10/2020 appointment with the following noted: Anxiety no better.  Hopeless and "waiting to die".  No joy or relaxation around the corner. Lorazepam helps otherwise shaking and sweating.  Cancelled appts yesterday.  Likes there's something inside the body that's twisting. Plan no med changes.  We will give paroxetine 80 mg another month or so to help.  11/06/2020 appointment with the following noted: I still have pit in stomach but anxiety is better with less shaking sweating, SOB.  Still don't want to go out much.  Still a lot of worry.  Once home in PM then can let go easier.  Goes to bed at 9 and sleeps good.  Normal function. No change in hopelessness.  No  joy or looking forward.  I feel sad and lonely a good part of the time.  Enjoyed football game. Neuro yesterday doing well with early stage PD. Plan: Reduce paroxetine by 50% every 2 weeks down to 20 mg and stay at 20 mg. Start Viibryd 10 mg daily with 350 calories for 1 week then increase to 20 mg daily  12/10/20 appt noted:  Problem with cost Viibryd. Continued with Viibryd 20 mg daily and down to paroxetine 20 mg daily without withdrawal.  The terror of the outside world continues.  Still anxious around most other people and going out. For the first time in a long while,  over the last week, he felt joy and connection and he even made other people laugh.  Had less anxiety around them than usual.  Less hopeless.  Managing work better. Found copay card and last RX $45.   No SE and no withdrawal.  Plan:  paroxetine  stay at 20 mg until the increase in Viibryd can have benefit Increase Viibryd with 350 calories to 40 mg daily DC Deplin due to no response   01/07/2021 appointment with the following noted: The fear of the world remains but significantly less hopeless.  Very anxious over bankruptcy hearing today.  18 mos left on dealing with that. Son graduated from law-school recently.  He got 2 awards.  Left after he walked across the stage.  Couldn't stay in the crowd later.  Ongoing panic with agoraphobia.   Less death thoughts but still some. Works from house and stays there.   Tolerated Viibryd well.   NO SE Needs Klonopin and hydroxyzine. Plan: paroxetine  stay at 20 mg until the increase in Viibryd can have benefit continue Viibryd with 350 calories to 40 mg daily  02/06/21 appt with following noted: Viibryd 40 and paxil 20. Stopped lithium after 3 weeks DT diarrhea and NR. Managing but not great.  Not dramatic improvement but thinks overall it is better than with Paxil.  Less depressed than 6 mos ago.  But anxiety over normal activities even in grocery store or waiting  room. Working hard.  Managing meetings.  Plan: Reduce Paxil to 10 mg for 2 weeks and stop it Increase Viibryd to 60 mg daily for better anxiety effects  04/03/2021 appointment with following noted: Couldn't afford Viibryd at $200/mo so dropped down to 40 mg daily. More crying on Viibryd even sobbing if not at work.  Feels very alone and hopeless.  Recurring dream disturbing with homesick feelings. Death thoughts without sui intent or plan.  Sleep good. Can do ok around people.  Able to respond to respond to positive stimuli.  Doing AA by internet.  Sober since 2006.  No history of BZ dependence. Asked about BZ for severe anxiety.  Past Psychiatric Medication Trials:  Brief duloxetine, sertraline 200, paroxetine 80, Viibryd 40 buspirone dizzy, gabapentin,  Lorazepam , clonazepam is better Hydroxyzine  Propranolol 40 BID Abilify helped the depression and sense of dread but nothing for anxiety. Dx PD so can't use risperidone  Review of Systems:   Review of Systems  Constitutional:  Positive for unexpected weight change. Negative for fatigue.  Respiratory:  Negative for chest tightness.   Cardiovascular:  Negative for chest pain and palpitations.  Gastrointestinal:  Negative for abdominal distention, diarrhea and nausea.  Musculoskeletal:  Negative for arthralgias.  Neurological:  Positive for tremors. Negative for weakness.  Psychiatric/Behavioral:  Positive for dysphoric mood. Negative for agitation, behavioral problems, confusion, decreased concentration, hallucinations, self-injury, sleep disturbance and suicidal ideas. The patient is nervous/anxious. The patient is not hyperactive.    Medications: I have reviewed the patient's current medications.  Current Outpatient Medications  Medication Sig Dispense Refill  . aspirin EC 81 MG tablet Take 1 tablet (81 mg total) by mouth 2 (two) times daily. (Patient taking differently: Take 81 mg by mouth daily.) 60 tablet 0  .  carbidopa-levodopa (SINEMET IR) 25-100 MG tablet Take 1 tablet by mouth 3 (three) times daily before meals. 270 tablet 4  . clonazePAM (KLONOPIN) 1 MG tablet Take 1 tablet (1 mg total) by mouth 2 (two) times daily. 60 tablet 1  . hydrOXYzine (ATARAX/VISTARIL) 25 MG tablet TAKE 1 TABLET BY MOUTH THREE TIMES DAILY AND 1-2 TABLETS AT BEDTIME AS NEEDED FOR ANXIETY OR INSOMNIA 150 tablet 2  . lamoTRIgine (LAMICTAL) 150 MG tablet TAKE 1 TABLET(150 MG) BY MOUTH AT BEDTIME 90 tablet 0  . lisinopril-hydrochlorothiazide (PRINZIDE,ZESTORETIC) 20-12.5 MG per tablet Take 1 tablet by mouth 2 (two) times daily.    . meloxicam (MOBIC) 15 MG tablet Take 15 mg by mouth daily.    . methocarbamol (ROBAXIN) 500 MG tablet Take 500 mg by mouth 3 (three) times daily.    . Multiple Vitamins-Minerals (MULTIVITAMIN WITH MINERALS) tablet Take 1 tablet by mouth daily.    . propranolol (INDERAL) 40 MG tablet Take 2 tablets (80 mg total) by mouth 2 (two) times daily. (Patient taking differently: Take 80 mg by mouth daily.) 360 tablet 0  . simvastatin (ZOCOR) 40 MG tablet Take 40 mg by mouth daily.    . Vilazodone HCl (VIIBRYD) 40 MG TABS Take 1 tablet (40 mg total) by mouth daily. 30 tablet 1  . lithium 300 MG tablet Take 1 tablet (300 mg total) by mouth at bedtime. (Patient not taking: No sig reported) 30 tablet 1  . omeprazole (PRILOSEC) 20 MG capsule Take 20 mg by mouth 2 (two) times daily. (Patient not taking: Reported on 04/03/2021)     No current facility-administered medications for this visit.    Medication Side Effects: None  Allergies: No Known Allergies  Past Medical History:  Diagnosis  Date  . Alcoholism [facility])    sober since 01-2011   . Anxiety   . Arthritis   . Balance problem   . Complication of anesthesia    unsuccessful spinal with right knee replacement, used general anesthesia  . Depression   . Difficulty concentrating   . Diverticulosis 2012  . GERD (gastroesophageal reflux disease)   .  Hemorrhoids   . Hyperlipidemia   . Hypertension   . Internal hemorrhoids 2012  . Obsessive compulsive disorder   . Osteoarthritis   . Rotator cuff tear, left   . Tear of right rotator cuff 08/03/2014  . Wears contact lenses     Family History  Problem Relation Age of Onset  . Stroke Mother   . Diabetes Mother   . Cancer Father   . Parkinson's disease Maternal Grandmother   . Colon cancer Neg Hx     Social History   Socioeconomic History  . Marital status: Divorced    Spouse name: Not on file  . Number of children: 3  . Years of education: Not on file  . Highest education level: Bachelor's degree (e.g., BA, AB, BS)  Occupational History  . Occupation: Tree surgeon  Tobacco Use  . Smoking status: Former    Types: E-cigarettes    Quit date: 08/01/2009    Years since quitting: 11.6  . Smokeless tobacco: Never  Vaping Use  . Vaping Use: Every day  . Start date: 08/12/2014  Substance and Sexual Activity  . Alcohol use: No    Comment: not since 2006  . Drug use: No    Comment: qut 1996  . Sexual activity: Not on file    Comment: uses e-cig  Other Topics Concern  . Not on file  Social History Narrative   Lives alone   No caffeine   Social Determinants of Health   Financial Resource Strain: Not on file  Food Insecurity: Not on file  Transportation Needs: Not on file  Physical Activity: Not on file  Stress: Not on file  Social Connections: Not on file  Intimate Partner Violence: Not on file    Past Medical History, Surgical history, Social history, and Family history were reviewed and updated as appropriate.   Please see review of systems for further details on the patient's review from today.   Objective:   Physical Exam:  There were no vitals taken for this visit.  Physical Exam Constitutional:      General: He is not in acute distress. Musculoskeletal:        General: No deformity.  Neurological:     Mental Status: He is alert and oriented to  person, place, and time.     Cranial Nerves: No dysarthria.     Coordination: Coordination normal.  Psychiatric:        Attention and Perception: Attention and perception normal. He does not perceive auditory or visual hallucinations.        Mood and Affect: Mood is anxious and depressed. Affect is not labile, blunt, angry or inappropriate.        Speech: Speech normal.        Behavior: Behavior normal. Behavior is cooperative.        Thought Content: Thought content normal. Thought content is not paranoid or delusional. Thought content does not include homicidal or suicidal ideation. Thought content does not include homicidal or suicidal plan.        Cognition and Memory: Cognition and memory normal.  Judgment: Judgment normal.     Comments: Talkative. Ongoing severe sx are partly worse again depression    Lab Review:     Component Value Date/Time   NA 136 08/02/2018 0458   K 5.0 08/02/2018 0458   CL 102 08/02/2018 0458   CO2 27 08/02/2018 0458   GLUCOSE 138 (H) 08/02/2018 0458   BUN 26 (H) 08/02/2018 0458   CREATININE 1.10 08/02/2018 0458   CALCIUM 9.0 08/02/2018 0458   PROT 7.6 08/12/2017 1445   ALBUMIN 4.3 08/12/2017 1445   AST 29 08/12/2017 1445   ALT 26 08/12/2017 1445   ALKPHOS 60 08/12/2017 1445   BILITOT 1.0 08/12/2017 1445   GFRNONAA >60 08/02/2018 0458   GFRAA >60 08/02/2018 0458       Component Value Date/Time   WBC 14.6 (H) 08/02/2018 0458   RBC 4.06 (L) 08/02/2018 0458   HGB 12.9 (L) 08/02/2018 0458   HCT 37.8 (L) 08/02/2018 0458   PLT 205 08/02/2018 0458   MCV 93.1 08/02/2018 0458   MCH 31.8 08/02/2018 0458   MCHC 34.1 08/02/2018 0458   RDW 12.9 08/02/2018 0458   LYMPHSABS 1.4 07/26/2018 0954   MONOABS 0.6 07/26/2018 0954   EOSABS 0.2 07/26/2018 0954   BASOSABS 0.0 07/26/2018 0954    No results found for: POCLITH, LITHIUM   No results found for: PHENYTOIN, PHENOBARB, VALPROATE, CBMZ   .res Assessment: Plan:    Grae was seen today for  follow-up, anxiety and depression.  Diagnoses and all orders for this visit:  Moderately severe recurrent major depression (HCC)  GAD (generalized anxiety disorder)  Social anxiety disorder  Panic disorder with agoraphobia  PTSD (post-traumatic stress disorder)  Alcohol dependence, in remission (HCC)  Greater than 50% of 50 min face to face time with patient was spent on counseling and coordination of care. We discussed Patient stated he has been on antidepressants very long time and wondered what his emotional state would be like off the medication.  This was partly influenced by online patient for him to described problems with withdrawal coming off of SSRIs.  He successfully came off antidepressant without withdrawal symptoms.  But then has had relapse of both depression and anxiety within a few weeks off the antidepressant.  SX have been severe and persistent.  Failed Viibryd. Wean, and start nortriptyline  Consider TMS, Spravato, ECT, TCA, MAOI, etc.45 min discussion details of each.  Consider lithium, mirtazapine, Remeron, trazodone, Deplin .  If viibryd fails trial TCA Lithium augmentation 300  HS .  Failed to help after 3 weeks.  Transcranial Magnetic Stimulation TMS- [facility] [place] Other Option is Spravato ( nasal spray ketamine)  Reduce Viibryd to 1/2 tablet 20 mg and start nortriptyline 25 mg 1 at night for 5 nights then, Continue Viibryd 20 mg daily and increase nortriptyline 2 of the 25 mg capsules for 5 nights, then Stop Viibryd and increase nortriptyline to 3 of the 25 mg capsules nightly. Wait 1 week and get the blood test in the morning. Disc SE  Continue propranolol to 60-80 mg TID for anxiety  Klonopin 1 mg BID  Disc risk triggering alcohol abuse.  He wants it bc feels desperate.  Disc fall risk.  We discussed the short-term risks associated with benzodiazepines including sedation and increased fall risk among others.  Discussed long-term side effect risk  including dependence, potential withdrawal symptoms, and the potential eventual dose-related risk of dementia.  But recent studies from 2020 dispute this association between benzodiazepines and dementia  risk. Newer studies in 2020 do not support an association with dementia. He doesn't notice SE  hydroxyzine 25 mg TID prn anxiety  And 1-2 hs prn insomnia. Continue gabapentin.    Discussed his chronic thoughts about death. They resolved so far.  He commits to safety and is not having suicidal thoughts.  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   Disc BZ.   Extensive discussion of how Covid has allowed social anxiety to get worse but is functional.   Genesight DT TR sx.  Reviewed results at length.  Will need to do so next appt also.   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  Continue AA.    Follow-up 8 weeks  Meredith Staggers, MD, DFAPA     Please see After Visit Summary for patient specific instructions.  Future Appointments  Date Time Provider Department Center  11/11/2021 11:30 AM Penumalli, Glenford Bayley, MD [facility] None    No orders of the defined types were placed in this encounter.     -------------------------------

## 2021-04-03 NOTE — Patient Instructions (Addendum)
Transcranial Magnetic Stimulation TMS- [facility] [place] Other Option is Spravato ( nasal spray ketamine) Electroconvulsive therapy  Reduce Viibryd to 1/2 tablet 20 mg and start nortriptyline 25 mg 1 at night for 5 nights then, Continue Viibryd 20 mg daily and increase nortriptyline 2 of the 25 mg capsules for 5 nights, then Stop Viibryd and increase nortriptyline to 3 of the 25 mg capsules nightly.  Wait 1 week and get the blood test in the morning.

## 2021-04-07 ENCOUNTER — Telehealth: Payer: Self-pay | Admitting: Psychiatry

## 2021-04-07 NOTE — Telephone Encounter (Signed)
Note stated "Wait 1 week and get the blood test in the morning." Did you mean one week form his appt or 1 week after he is at the full dose of nortriptyline?

## 2021-04-07 NOTE — Telephone Encounter (Signed)
Adam Harvey called because he is confused on when he is to get his labs done.  He not sure if he is to get them 1 week from his last appt. Or to wait until he is at full dose of the nortriptline. Please call to clearify

## 2021-04-07 NOTE — Telephone Encounter (Signed)
LVM with info

## 2021-04-07 NOTE — Telephone Encounter (Signed)
Yes, the instructions were meant to be read sequentially.  After he reaches the full dose, wait 1 week from that point and then get the blood test.

## 2021-04-17 ENCOUNTER — Other Ambulatory Visit: Payer: Self-pay | Admitting: Psychiatry

## 2021-04-27 LAB — NORTRIPTYLINE LEVEL: Nortriptyline Lvl: 113 mcg/L (ref 50–150)

## 2021-04-28 NOTE — Progress Notes (Signed)
Serum nortriptyline level was 113.  The ideal ranges between 50 and 150 mg.  So this blood level is about perfect to optimize the chance of response for depression and anxiety. Therefore please let them know that his nortriptyline level is close to ideal and we want to wait several weeks to give it a chance to work.  No med changes indicated.

## 2021-04-29 ENCOUNTER — Other Ambulatory Visit: Payer: Self-pay

## 2021-04-29 DIAGNOSIS — F4010 Social phobia, unspecified: Secondary | ICD-10-CM

## 2021-04-29 DIAGNOSIS — F411 Generalized anxiety disorder: Secondary | ICD-10-CM

## 2021-04-29 DIAGNOSIS — F4001 Agoraphobia with panic disorder: Secondary | ICD-10-CM

## 2021-04-29 MED ORDER — CLONAZEPAM 1 MG PO TABS: 1.0000 mg | ORAL_TABLET | Freq: Two times a day (BID) | ORAL | 0 refills | Status: DC

## 2021-04-29 NOTE — Progress Notes (Signed)
Pt informed

## 2021-05-02 ENCOUNTER — Other Ambulatory Visit: Payer: Self-pay | Admitting: Psychiatry

## 2021-05-02 DIAGNOSIS — F4310 Post-traumatic stress disorder, unspecified: Secondary | ICD-10-CM

## 2021-05-02 DIAGNOSIS — F411 Generalized anxiety disorder: Secondary | ICD-10-CM

## 2021-05-02 DIAGNOSIS — F4001 Agoraphobia with panic disorder: Secondary | ICD-10-CM

## 2021-05-13 ENCOUNTER — Encounter: Payer: Self-pay | Admitting: Internal Medicine

## 2021-05-19 ENCOUNTER — Encounter: Payer: Self-pay | Admitting: Psychiatry

## 2021-05-19 ENCOUNTER — Other Ambulatory Visit: Payer: Self-pay

## 2021-05-19 ENCOUNTER — Ambulatory Visit (INDEPENDENT_AMBULATORY_CARE_PROVIDER_SITE_OTHER): Payer: 59 | Admitting: Psychiatry

## 2021-05-19 DIAGNOSIS — F332 Major depressive disorder, recurrent severe without psychotic features: Secondary | ICD-10-CM

## 2021-05-19 DIAGNOSIS — G8929 Other chronic pain: Secondary | ICD-10-CM

## 2021-05-19 DIAGNOSIS — F1021 Alcohol dependence, in remission: Secondary | ICD-10-CM

## 2021-05-19 DIAGNOSIS — F411 Generalized anxiety disorder: Secondary | ICD-10-CM

## 2021-05-19 DIAGNOSIS — F4010 Social phobia, unspecified: Secondary | ICD-10-CM | POA: Diagnosis not present

## 2021-05-19 DIAGNOSIS — F4310 Post-traumatic stress disorder, unspecified: Secondary | ICD-10-CM

## 2021-05-19 DIAGNOSIS — F4001 Agoraphobia with panic disorder: Secondary | ICD-10-CM | POA: Diagnosis not present

## 2021-05-19 MED ORDER — NORTRIPTYLINE HCL 25 MG PO CAPS: 75.0000 mg | ORAL_CAPSULE | Freq: Every day | ORAL | 1 refills | Status: DC

## 2021-05-19 MED ORDER — LITHIUM CARBONATE 150 MG PO CAPS: 150.0000 mg | ORAL_CAPSULE | Freq: Every day | ORAL | 0 refills | Status: DC

## 2021-05-19 NOTE — Progress Notes (Signed)
Adam Harvey [id number] 15-Aug-1961 60 y.o.    Subjective:   Patient ID:  Adam Harvey is a 60 y.o. (DOB 11-13-1960) male.  Chief Complaint:  Chief Complaint  Patient presents with   Follow-up   Depression   Anxiety    Anxiety Symptoms include nervous/anxious behavior. Patient reports no chest pain, confusion, decreased concentration, nausea, palpitations or suicidal ideas.    Medication Refill Pertinent negatives include no arthralgias, chest pain, fatigue, nausea or weakness.  Adam Harvey presents to the office today for follow-up of several diagnoses and wanted an earlier appointment because he wanted to restart duloxetine.  visit January 16, 2019.  He had wanted a trial off of antidepressants in part because of reading on the Internet about withdrawal symptoms.  He had been off antidepressants for 2 months.  And felt more anxious and depressed.  We discussed it and decided to return to duloxetine and increase to 60 mg daily.  visit in July after being back on the medication of duloxetine 60 mg daily he reported the following: It's great.  No depression but some anxiety that is managed.  He's 9/10 and satisfied.  Pleased with the dosage..  Tolerated well.  Sleep is good.  Better self care and better eating.  Working really hard but business is good.  In Holiday representative.  Location manager.  Energy better. Good appetite.  For years has had thoughts of if got terminal disease it would be easier and they are worse.  Hopelessness resolved. He had wanted to consider tapering off gabapentin and was given permission to do that if he chose to do so. He was continued on lamotrigine 150 mg daily.  visit October 16 2019.   Don't feel depressed and kind of happy but lingering thoughts of "is this as good as it gets?".  Not consuming him.  Not much excitement about things except work.  Doing design work for Teacher, early years/pre.  Some anxiety about being around people. Not DT Covid unless it made it worse.   Prefer to be alone.  Did a lot of theater in the past but not excited like he used to be.  Likes yard work.  Not interested in dating.  Wants to talk about Gabapentin for pain.  Previously thought it helped.  Wants to try to stop it and see if he can do without it.  Knees don't hurt bc replaced. Plan:  Cont duloxetine 60 and add Abilify 5 Taper gabapentin off  12/25/19 appt, the following is noted: Anxiety not good.  Weary of dealing with it.  Had trouble dealing with number of people on the beach this weekend DT anxiety.  At times would like a BZ.  Always felt anxious but not managing it well.  Isolating. No SE gabapentin.  Did not stop it bc he forgot it. Plan: Increase gabapentin to 300 BID and 600 HS for 2 days, then incrase to 2 in AM  And HS and 1 in middle of the day for 4 days, then increase to 600 TID.  For a week, if NR then increase to 900  TID.    02/08/2020 appointment with the following noted: Increased gabapentin to 600 mg BID.  Has to nap if takes more.  No less anxious with the increase in dose.  No benefit to it. Asks about BZ.   Doesn't really want to leave the house and anxiety driving.  Can get anxious even in yard bc afraid of having to talk to people.  No paranoia. Dread  of things.  Emotional eating. Plan: Switch to paroxetine 30 mg for better antianxiety effect and hopefully help depression a little more.  Start one half of paroxetine 20 mg tablet and reduce duloxetine to 2 of the 20 mg capsules for 5 days, Then increase paroxetine to 1 tablet daily and reduce duloxetine to 1 capsule daily for 5 days, Then increase to 1-1/2 paroxetine tablets daily and stop duloxetine Continue Abilify 5 mg augmentation for residual anhedonia.   03/08/2020 phone call patient stating he felt Paxil was causing tremors in hands and feet and not helping with anxiety.  He was instructed to reduce paroxetine to 20 mg daily and stop Abilify for 3 days and then restart Abilify 1/2 tablet. 03/20/2020  phone call patient stating the above changes made no difference in tremor.  He was instructed to stop Abilify and increase paroxetine to 40 mg daily. 04/01/2020 patient called stating his PCP wanted him to stop taking paroxetine completely to see if the tremors would resolve. 05/01/2020 patient phone call stating he was diagnosed with Parkinson's disease but because of anxiety needed to be on some medication he was encouraged to restart the paroxetine which had never had an adequate trial for his anxiety. 06/03/2020 phone call from patient complaining of panic attacks.  He had increase paroxetine to 40 mg a day and was given hydroxyzine as needed.  06/07/2020 urgent appointment with the following noted: Not well.  Depression as bad as ever and anxiety through the roof.  Worse beginning of the week bc feels completely overwhelming.   Getting OOB, brushing teeth is hard, being in public hard. Social avoidance affecting work.  Don't feel scared of PD but slap in the face about age.  Single and in middle of 5 year bankruptcy.  Trouble with concentration. Not suicidal but death thoughts. Yesterday a good day and laughed first time in a while. Poor appetite and lost weight. On paroxetine 40 mg for 3 weeks. No SE.   Trouble staying asleep. 4-5  Hours. Plan: Needs more time to respond to paroxetine 40 mg daily Deplin 15 mg daily for depression  With samples. Increase hydroxyzine for ST anxiety.  07/10/2020 appointment with the following noted: Depression is better but anxiety is through the roof.  Can laugh again.  Less hopeless and paralyzed by depression.  Now 5/10 depression.  Anxiety is still 10/10.  Will have nausea going out.  Has managed the anxiety pretty well.  Wonders if PD is causing anxiety.  Over the last 2-3 years more social anxiety.   No SE with Paxil. Hydroxyzine TID and 2 HS.  Lorazepam helps but manages it. Plan: Increase paroxetine 60 mg daily.  It helped depression but not anxiety so  far.  08/06/20 appt with following noted: Cant tell a difference.  Anxiety still up.  BP up with stress.  Takes Ativan prn. Started hydroxyzine 75 HS and sleeping better.  If EMA then hard to get back to sleep.  Not anxious at night typically.  Around people tense and heart races. I don't feel as depressed but no decorations for Xmas for the first time.  Typically after Jan 1 falls into depression and a bit anxious over it. Plan: Continue paroxetine 60 mg daily at least another month.  It helped depression but not anxiety so far. Increase propranolol to 60-80 mg TID for anxiety  09/05/2020 appointment with following noted: Still awakens terrified of the world and hard to get OOB. Depression is still a bit of  a problem. Going anywhere to deal with anyone makes him very anxious to the point of nausea.  When takes lorazepam it eases it off rather than eliminating it. Lorazepam 1 mg avg daily.  Really bad Xmas.  Even to the point of scared to go out with kids.  Once out he does ok usually. SE a little tired.  Not severe.   Hydroxyzine during day and 75 mg at night for sleep and it helps. No libido. Dx PD so can't use risperidone Plan: Increase paroxetine 80 mg daily at least another month.  It helped depression but not anxiety so far.  10/10/2020 appointment with the following noted: Anxiety no better.  Hopeless and "waiting to die".  No joy or relaxation around the corner. Lorazepam helps otherwise shaking and sweating.  Cancelled appts yesterday.  Likes there's something inside the body that's twisting. Plan no med changes.  We will give paroxetine 80 mg another month or so to help.  11/06/2020 appointment with the following noted: I still have pit in stomach but anxiety is better with less shaking sweating, SOB.  Still don't want to go out much.  Still a lot of worry.  Once home in PM then can let go easier.  Goes to bed at 9 and sleeps good.  Normal function. No change in hopelessness.  No  joy or looking forward.  I feel sad and lonely a good part of the time.  Enjoyed football game. Neuro yesterday doing well with early stage PD. Plan: Reduce paroxetine by 50% every 2 weeks down to 20 mg and stay at 20 mg. Start Viibryd 10 mg daily with 350 calories for 1 week then increase to 20 mg daily  12/10/20 appt noted:  Problem with cost Viibryd. Continued with Viibryd 20 mg daily and down to paroxetine 20 mg daily without withdrawal.  The terror of the outside world continues.  Still anxious around most other people and going out. For the first time in a long while,  over the last week, he felt joy and connection and he even made other people laugh.  Had less anxiety around them than usual.  Less hopeless.  Managing work better. Found copay card and last RX $45.   No SE and no withdrawal.  Plan:  paroxetine  stay at 20 mg until the increase in Viibryd can have benefit Increase Viibryd with 350 calories to 40 mg daily DC Deplin due to no response   01/07/2021 appointment with the following noted: The fear of the world remains but significantly less hopeless.  Very anxious over bankruptcy hearing today.  18 mos left on dealing with that. Son graduated from law-school recently.  He got 2 awards.  Left after he walked across the stage.  Couldn't stay in the crowd later.  Ongoing panic with agoraphobia.   Less death thoughts but still some. Works from house and stays there.   Tolerated Viibryd well.   NO SE Needs Klonopin and hydroxyzine. Plan: paroxetine  stay at 20 mg until the increase in Viibryd can have benefit continue Viibryd with 350 calories to 40 mg daily  02/06/21 appt with following noted: Viibryd 40 and paxil 20. Stopped lithium after 3 weeks DT diarrhea and NR. Managing but not great.  Not dramatic improvement but thinks overall it is better than with Paxil.  Less depressed than 6 mos ago.  But anxiety over normal activities even in grocery store or waiting  room. Working hard.  Managing meetings.  Plan: Reduce Paxil to 10 mg for 2 weeks and stop it Increase Viibryd to 60 mg daily for better anxiety effects  04/03/2021 appointment with following noted: Couldn't afford Viibryd at $200/mo so dropped down to 40 mg daily. More crying on Viibryd even sobbing if not at work.  Feels very alone and hopeless.  Recurring dream disturbing with homesick feelings. Death thoughts without sui intent or plan.  Sleep good. Can do ok around people.  Able to respond to respond to positive stimuli. Plan:  Reduce Viibryd to 1/2 tablet 20 mg and start nortriptyline 25 mg 1 at night for 5 nights then, Continue Viibryd 20 mg daily and increase nortriptyline 2 of the 25 mg capsules for 5 nights, then Stop Viibryd and increase nortriptyline to 3 of the 25 mg capsules nightly. Wait 1 week and get the blood test in the morning. Disc SE  05/19/2021 appointment with the following noted: Cautiously optimistic with change to nortriptyline.  No longer homesick feeling, more confident and less crying.  Still doesn't like going anywhere.  Trying to reduce clonazepam.  Last Saturday 60th BD and really down bc kids didn't come to surprise him..  Lonely. PE last week was good. Handling going out easier.   Been on nortriptyline 75 mg for a month. No full panic lately.  Still leaving house and driving causes anxiety. No SE except 2-3 nights of indigestion. Sleep is good.  Doing AA by internet.  Sober since 2006.  No history of BZ dependence. Asked about BZ for severe anxiety.  Past Psychiatric Medication Trials:  Brief duloxetine, sertraline 200, paroxetine 80, Viibryd 40 buspirone dizzy, gabapentin,  Lorazepam , clonazepam is better Hydroxyzine  Propranolol 40 BID Abilify helped the depression and sense of dread but nothing for anxiety. Dx PD so can't use risperidone  Review of Systems:   Review of Systems  Constitutional:  Positive for unexpected weight change. Negative  for fatigue.  Cardiovascular:  Negative for chest pain and palpitations.  Gastrointestinal:  Negative for diarrhea and nausea.  Musculoskeletal:  Negative for arthralgias.  Neurological:  Positive for tremors. Negative for weakness.  Psychiatric/Behavioral:  Positive for dysphoric mood. Negative for agitation, behavioral problems, confusion, decreased concentration, hallucinations, self-injury, sleep disturbance and suicidal ideas. The patient is nervous/anxious. The patient is not hyperactive.    Medications: I have reviewed the patient's current medications.  Current Outpatient Medications  Medication Sig Dispense Refill   aspirin EC 81 MG tablet Take 1 tablet (81 mg total) by mouth 2 (two) times daily. (Patient taking differently: Take 81 mg by mouth daily.) 60 tablet 0   carbidopa-levodopa (SINEMET IR) 25-100 MG tablet Take 1 tablet by mouth 3 (three) times daily before meals. 270 tablet 4   clonazePAM (KLONOPIN) 1 MG tablet Take 1 tablet (1 mg total) by mouth 2 (two) times daily. 60 tablet 0   hydrOXYzine (ATARAX/VISTARIL) 25 MG tablet TAKE 1 TABLET BY MOUTH THREE TIMES DAILY AND 1-2 TABLETS AT BEDTIME AS NEEDED FOR ANXIETY OR INSOMNIA 150 tablet 2   lamoTRIgine (LAMICTAL) 150 MG tablet TAKE 1 TABLET(150 MG) BY MOUTH AT BEDTIME 90 tablet 0   lisinopril-hydrochlorothiazide (PRINZIDE,ZESTORETIC) 20-12.5 MG per tablet Take 1 tablet by mouth 2 (two) times daily.     meloxicam (MOBIC) 15 MG tablet Take 15 mg by mouth daily.     methocarbamol (ROBAXIN) 500 MG tablet Take 500 mg by mouth 3 (three) times daily.     Multiple Vitamins-Minerals (MULTIVITAMIN WITH MINERALS) tablet Take 1 tablet  by mouth daily.     omeprazole (PRILOSEC) 20 MG capsule Take 20 mg by mouth 2 (two) times daily.     propranolol (INDERAL) 40 MG tablet Take 2 tablets (80 mg total) by mouth 2 (two) times daily. (Patient taking differently: Take 80 mg by mouth daily.) 360 tablet 0   simvastatin (ZOCOR) 40 MG tablet Take 40 mg  by mouth daily.     lithium carbonate 150 MG capsule Take 1 capsule (150 mg total) by mouth daily. 90 capsule 0   nortriptyline (PAMELOR) 25 MG capsule Take 3 capsules (75 mg total) by mouth at bedtime. 90 capsule 1   No current facility-administered medications for this visit.    Medication Side Effects: None  Allergies: No Known Allergies  Past Medical History:  Diagnosis Date   Alcoholism (HCC)    sober since 01-2011    Anxiety    Arthritis    Balance problem    Complication of anesthesia    unsuccessful spinal with right knee replacement, used general anesthesia   Depression    Difficulty concentrating    Diverticulosis 2012   GERD (gastroesophageal reflux disease)    Hemorrhoids    Hyperlipidemia    Hypertension    Internal hemorrhoids 2012   Obsessive compulsive disorder    Osteoarthritis    Rotator cuff tear, left    Tear of right rotator cuff 08/03/2014   Wears contact lenses     Family History  Problem Relation Age of Onset   Stroke Mother    Diabetes Mother    Cancer Father    Parkinson's disease Maternal Grandmother    Colon cancer Neg Hx     Social History   Socioeconomic History   Marital status: Divorced    Spouse name: Not on file   Number of children: 3   Years of education: Not on file   Highest education level: Bachelor's degree (e.g., BA, AB, BS)  Occupational History   Occupation: Tree surgeon  Tobacco Use   Smoking status: Former    Types: E-cigarettes    Quit date: 08/01/2009    Years since quitting: 11.8   Smokeless tobacco: Never  Vaping Use   Vaping Use: Every day   Start date: 08/12/2014  Substance and Sexual Activity   Alcohol use: No    Comment: not since 2006   Drug use: No    Comment: qut 1996   Sexual activity: Not on file    Comment: uses e-cig  Other Topics Concern   Not on file  Social History Narrative   Lives alone   No caffeine   Social Determinants of Health   Financial Resource Strain: Not on file   Food Insecurity: Not on file  Transportation Needs: Not on file  Physical Activity: Not on file  Stress: Not on file  Social Connections: Not on file  Intimate Partner Violence: Not on file    Past Medical History, Surgical history, Social history, and Family history were reviewed and updated as appropriate.   Please see review of systems for further details on the patient's review from today.   Objective:   Physical Exam:  There were no vitals taken for this visit.  Physical Exam Constitutional:      General: He is not in acute distress. Musculoskeletal:        General: No deformity.  Neurological:     Mental Status: He is alert and oriented to person, place, and time.     Cranial  Nerves: No dysarthria.     Coordination: Coordination normal.  Psychiatric:        Attention and Perception: Attention and perception normal. He does not perceive auditory or visual hallucinations.        Mood and Affect: Mood is anxious and depressed. Affect is not labile, blunt, angry or inappropriate.        Speech: Speech normal.        Behavior: Behavior normal. Behavior is cooperative.        Thought Content: Thought content normal. Thought content is not paranoid or delusional. Thought content does not include homicidal or suicidal ideation. Thought content does not include homicidal or suicidal plan.        Cognition and Memory: Cognition and memory normal.        Judgment: Judgment normal.     Comments: Talkative. Ongoing severe sx are partly better    Lab Review:     Component Value Date/Time   NA 136 08/02/2018 0458   K 5.0 08/02/2018 0458   CL 102 08/02/2018 0458   CO2 27 08/02/2018 0458   GLUCOSE 138 (H) 08/02/2018 0458   BUN 26 (H) 08/02/2018 0458   CREATININE 1.10 08/02/2018 0458   CALCIUM 9.0 08/02/2018 0458   PROT 7.6 08/12/2017 1445   ALBUMIN 4.3 08/12/2017 1445   AST 29 08/12/2017 1445   ALT 26 08/12/2017 1445   ALKPHOS 60 08/12/2017 1445   BILITOT 1.0 08/12/2017  1445   GFRNONAA >60 08/02/2018 0458   GFRAA >60 08/02/2018 0458       Component Value Date/Time   WBC 14.6 (H) 08/02/2018 0458   RBC 4.06 (L) 08/02/2018 0458   HGB 12.9 (L) 08/02/2018 0458   HCT 37.8 (L) 08/02/2018 0458   PLT 205 08/02/2018 0458   MCV 93.1 08/02/2018 0458   MCH 31.8 08/02/2018 0458   MCHC 34.1 08/02/2018 0458   RDW 12.9 08/02/2018 0458   LYMPHSABS 1.4 07/26/2018 0954   MONOABS 0.6 07/26/2018 0954   EOSABS 0.2 07/26/2018 0954   BASOSABS 0.0 07/26/2018 0954    No results found for: POCLITH, LITHIUM   No results found for: PHENYTOIN, PHENOBARB, VALPROATE, CBMZ   04/23/2021 nortriptyline level 113.  No med change indicated.  On nortriptyline 75 mg daily  .res Assessment: Plan:    Onaje was seen today for follow-up, depression and anxiety.  Diagnoses and all orders for this visit:  Moderately severe recurrent major depression (HCC) -     lithium carbonate 150 MG capsule; Take 1 capsule (150 mg total) by mouth daily. -     nortriptyline (PAMELOR) 25 MG capsule; Take 3 capsules (75 mg total) by mouth at bedtime.  GAD (generalized anxiety disorder)  Panic disorder with agoraphobia  Social anxiety disorder  PTSD (post-traumatic stress disorder)  Alcohol dependence, in remission (HCC)  Other chronic pain  Greater than 50% of 30 min face to face time with patient was spent on counseling and coordination of care. We discussed Patient stated he has been on antidepressants very long time and wondered what his emotional state would be like off the medication.  This was partly influenced by online patient for him to described problems with withdrawal coming off of SSRIs.  He successfully came off antidepressant without withdrawal symptoms.  But then has had relapse of both depression and anxiety within a few weeks off the antidepressant.  SX have been severe and persistent but is seeing some improvement from nortriptyline after 1 month.  Consider  TMS, Spravato,  ECT, TCA, MAOI, etc.45 min discussion details of each.  Consider lithium, mirtazapine, Remeron, trazodone, Deplin .  Lithium augmentation option to try lower dose 150 bc diarrhea with 300 mg. Yes.  Transcranial Magnetic Stimulation TMS- [facility], [place] Other Option is Spravato ( nasal spray ketamine)  Switch to nortriptyline 75 HS led to improvement. 04/23/2021 nortriptyline level 113.  No med change indicated.  On nortriptyline 75 mg daily Disc SE  Continue propranolol to 60-80 mg TID for anxiety  Klonopin 1 mg BID  Disc risk triggering alcohol abuse.  He wants it bc feels desperate.  Disc fall risk.  We discussed the short-term risks associated with benzodiazepines including sedation and increased fall risk among others.  Discussed long-term side effect risk including dependence, potential withdrawal symptoms, and the potential eventual dose-related risk of dementia.  But recent studies from 2020 dispute this association between benzodiazepines and dementia risk. Newer studies in 2020 do not support an association with dementia. He doesn't notice SE He needs this mainly to leave the house.  hydroxyzine 25 mg TID prn anxiety  And 1-2 hs prn insomnia. Continue gabapentin.    Discussed his chronic thoughts about death. They resolved so far.  He commits to safety and is not having suicidal thoughts.  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   Disc BZ.   Extensive discussion of how Covid has allowed social anxiety to get worse but is functional.   Genesight DT TR sx.  Reviewed results at length.  Will need to do so next appt also.   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  Continue AA.    Disc vitamin D in detail.  Vitamin D 5000 units daily until April 1  Follow-up 4 weeks  Meredith Staggers, MD, DFAPA     Please see After Visit Summary for patient specific instructions.  Future Appointments  Date Time Provider Department Center  06/19/2021  2:00 PM  Cottle, Steva Ready., MD [facility] None  11/11/2021 11:30 AM Penumalli, Glenford Bayley, MD [facility] None    No orders of the defined types were placed in this encounter.     -------------------------------

## 2021-05-19 NOTE — Patient Instructions (Addendum)
Vitamin D 5000 units daily until April 1 Add lithium 150 mg daily

## 2021-06-06 ENCOUNTER — Other Ambulatory Visit: Payer: Self-pay | Admitting: Psychiatry

## 2021-06-06 DIAGNOSIS — F4001 Agoraphobia with panic disorder: Secondary | ICD-10-CM

## 2021-06-06 DIAGNOSIS — F4010 Social phobia, unspecified: Secondary | ICD-10-CM

## 2021-06-06 DIAGNOSIS — F411 Generalized anxiety disorder: Secondary | ICD-10-CM

## 2021-06-19 ENCOUNTER — Encounter: Payer: Self-pay | Admitting: Psychiatry

## 2021-06-19 ENCOUNTER — Other Ambulatory Visit: Payer: Self-pay

## 2021-06-19 ENCOUNTER — Ambulatory Visit (INDEPENDENT_AMBULATORY_CARE_PROVIDER_SITE_OTHER): Payer: 59 | Admitting: Psychiatry

## 2021-06-19 DIAGNOSIS — F4001 Agoraphobia with panic disorder: Secondary | ICD-10-CM

## 2021-06-19 DIAGNOSIS — F411 Generalized anxiety disorder: Secondary | ICD-10-CM | POA: Diagnosis not present

## 2021-06-19 DIAGNOSIS — F1021 Alcohol dependence, in remission: Secondary | ICD-10-CM

## 2021-06-19 DIAGNOSIS — F4310 Post-traumatic stress disorder, unspecified: Secondary | ICD-10-CM

## 2021-06-19 DIAGNOSIS — F4010 Social phobia, unspecified: Secondary | ICD-10-CM | POA: Diagnosis not present

## 2021-06-19 DIAGNOSIS — F332 Major depressive disorder, recurrent severe without psychotic features: Secondary | ICD-10-CM

## 2021-06-19 MED ORDER — CLONIDINE HCL 0.1 MG PO TABS: ORAL_TABLET | ORAL | 1 refills | Status: DC

## 2021-06-19 NOTE — Progress Notes (Signed)
Adam Harvey [id number] 12-06-1960 60 y.o.    Subjective:   Patient ID:  Adam Harvey is a 60 y.o. (DOB 1961/07/18) male.  Chief Complaint:  Chief Complaint  Patient presents with   Follow-up   Depression   Anxiety    Anxiety Symptoms include dizziness and nervous/anxious behavior. Patient reports no chest pain, confusion, decreased concentration, nausea, palpitations or suicidal ideas.    Medication Refill Pertinent negatives include no arthralgias, chest pain, fatigue, nausea or weakness.  Depression        Associated symptoms include no decreased concentration, no fatigue and no suicidal ideas. Adam Harvey presents to the office today for follow-up of several diagnoses and wanted an earlier appointment because he wanted to restart duloxetine.  visit January 16, 2019.  He had wanted a trial off of antidepressants in part because of reading on the Internet about withdrawal symptoms.  He had been off antidepressants for 2 months.  And felt more anxious and depressed.  We discussed it and decided to return to duloxetine and increase to 60 mg daily.  visit in July after being back on the medication of duloxetine 60 mg daily he reported the following: It's great.  No depression but some anxiety that is managed.  He's 9/10 and satisfied.  Pleased with the dosage..  Tolerated well.  Sleep is good.  Better self care and better eating.  Working really hard but business is good.  In Holiday representative.  Location manager.  Energy better. Good appetite.  For years has had thoughts of if got terminal disease it would be easier and they are worse.  Hopelessness resolved. He had wanted to consider tapering off gabapentin and was given permission to do that if he chose to do so. He was continued on lamotrigine 150 mg daily.  visit October 16 2019.   Don't feel depressed and kind of happy but lingering thoughts of "is this as good as it gets?".  Not consuming him.  Not much excitement about things except work.   Doing design work for Teacher, early years/pre.  Some anxiety about being around people. Not DT Covid unless it made it worse.  Prefer to be alone.  Did a lot of theater in the past but not excited like he used to be.  Likes yard work.  Not interested in dating.  Wants to talk about Gabapentin for pain.  Previously thought it helped.  Wants to try to stop it and see if he can do without it.  Knees don't hurt bc replaced. Plan:  Cont duloxetine 60 and add Abilify 5 Taper gabapentin off  12/25/19 appt, the following is noted: Anxiety not good.  Weary of dealing with it.  Had trouble dealing with number of people on the beach this weekend DT anxiety.  At times would like a BZ.  Always felt anxious but not managing it well.  Isolating. No SE gabapentin.  Did not stop it bc he forgot it. Plan: Increase gabapentin to 300 BID and 600 HS for 2 days, then incrase to 2 in AM  And HS and 1 in middle of the day for 4 days, then increase to 600 TID.  For a week, if NR then increase to 900  TID.    02/08/2020 appointment with the following noted: Increased gabapentin to 600 mg BID.  Has to nap if takes more.  No less anxious with the increase in dose.  No benefit to it. Asks about BZ.   Doesn't really want to leave the house  and anxiety driving.  Can get anxious even in yard bc afraid of having to talk to people.  No paranoia. Dread of things.  Emotional eating. Plan: Switch to paroxetine 30 mg for better antianxiety effect and hopefully help depression a little more.  Start one half of paroxetine 20 mg tablet and reduce duloxetine to 2 of the 20 mg capsules for 5 days, Then increase paroxetine to 1 tablet daily and reduce duloxetine to 1 capsule daily for 5 days, Then increase to 1-1/2 paroxetine tablets daily and stop duloxetine Continue Abilify 5 mg augmentation for residual anhedonia.   03/08/2020 phone call patient stating he felt Paxil was causing tremors in hands and feet and not helping with anxiety.  He was  instructed to reduce paroxetine to 20 mg daily and stop Abilify for 3 days and then restart Abilify 1/2 tablet. 03/20/2020 phone call patient stating the above changes made no difference in tremor.  He was instructed to stop Abilify and increase paroxetine to 40 mg daily. 04/01/2020 patient called stating his PCP wanted him to stop taking paroxetine completely to see if the tremors would resolve. 05/01/2020 patient phone call stating he was diagnosed with Parkinson's disease but because of anxiety needed to be on some medication he was encouraged to restart the paroxetine which had never had an adequate trial for his anxiety. 06/03/2020 phone call from patient complaining of panic attacks.  He had increase paroxetine to 40 mg a day and was given hydroxyzine as needed.  06/07/2020 urgent appointment with the following noted: Not well.  Depression as bad as ever and anxiety through the roof.  Worse beginning of the week bc feels completely overwhelming.   Getting OOB, brushing teeth is hard, being in public hard. Social avoidance affecting work.  Don't feel scared of PD but slap in the face about age.  Single and in middle of 5 year bankruptcy.  Trouble with concentration. Not suicidal but death thoughts. Yesterday a good day and laughed first time in a while. Poor appetite and lost weight. On paroxetine 40 mg for 3 weeks. No SE.   Trouble staying asleep. 4-5  Hours. Plan: Needs more time to respond to paroxetine 40 mg daily Deplin 15 mg daily for depression  With samples. Increase hydroxyzine for ST anxiety.  07/10/2020 appointment with the following noted: Depression is better but anxiety is through the roof.  Can laugh again.  Less hopeless and paralyzed by depression.  Now 5/10 depression.  Anxiety is still 10/10.  Will have nausea going out.  Has managed the anxiety pretty well.  Wonders if PD is causing anxiety.  Over the last 2-3 years more social anxiety.   No SE with Paxil. Hydroxyzine TID and 2  HS.  Lorazepam helps but manages it. Plan: Increase paroxetine 60 mg daily.  It helped depression but not anxiety so far.  08/06/20 appt with following noted: Cant tell a difference.  Anxiety still up.  BP up with stress.  Takes Ativan prn. Started hydroxyzine 75 HS and sleeping better.  If EMA then hard to get back to sleep.  Not anxious at night typically.  Around people tense and heart races. I don't feel as depressed but no decorations for Xmas for the first time.  Typically after Jan 1 falls into depression and a bit anxious over it. Plan: Continue paroxetine 60 mg daily at least another month.  It helped depression but not anxiety so far. Increase propranolol to 60-80 mg TID for anxiety  09/05/2020 appointment with following noted: Still awakens terrified of the world and hard to get OOB. Depression is still a bit of a problem. Going anywhere to deal with anyone makes him very anxious to the point of nausea.  When takes lorazepam it eases it off rather than eliminating it. Lorazepam 1 mg avg daily.  Really bad Xmas.  Even to the point of scared to go out with kids.  Once out he does ok usually. SE a little tired.  Not severe.   Hydroxyzine during day and 75 mg at night for sleep and it helps. No libido. Dx PD so can't use risperidone Plan: Increase paroxetine 80 mg daily at least another month.  It helped depression but not anxiety so far.  10/10/2020 appointment with the following noted: Anxiety no better.  Hopeless and "waiting to die".  No joy or relaxation around the corner. Lorazepam helps otherwise shaking and sweating.  Cancelled appts yesterday.  Likes there's something inside the body that's twisting. Plan no med changes.  We will give paroxetine 80 mg another month or so to help.  11/06/2020 appointment with the following noted: I still have pit in stomach but anxiety is better with less shaking sweating, SOB.  Still don't want to go out much.  Still a lot of worry.  Once home  in PM then can let go easier.  Goes to bed at 9 and sleeps good.  Normal function. No change in hopelessness.  No joy or looking forward.  I feel sad and lonely a good part of the time.  Enjoyed football game. Neuro yesterday doing well with early stage PD. Plan: Reduce paroxetine by 50% every 2 weeks down to 20 mg and stay at 20 mg. Start Viibryd 10 mg daily with 350 calories for 1 week then increase to 20 mg daily  12/10/20 appt noted:  Problem with cost Viibryd. Continued with Viibryd 20 mg daily and down to paroxetine 20 mg daily without withdrawal.  The terror of the outside world continues.  Still anxious around most other people and going out. For the first time in a long while,  over the last week, he felt joy and connection and he even made other people laugh.  Had less anxiety around them than usual.  Less hopeless.  Managing work better. Found copay card and last RX $45.   No SE and no withdrawal.  Plan:  paroxetine  stay at 20 mg until the increase in Viibryd can have benefit Increase Viibryd with 350 calories to 40 mg daily DC Deplin due to no response   01/07/2021 appointment with the following noted: The fear of the world remains but significantly less hopeless.  Very anxious over bankruptcy hearing today.  18 mos left on dealing with that. Son graduated from law-school recently.  He got 2 awards.  Left after he walked across the stage.  Couldn't stay in the crowd later.  Ongoing panic with agoraphobia.   Less death thoughts but still some. Works from house and stays there.   Tolerated Viibryd well.   NO SE Needs Klonopin and hydroxyzine. Plan: paroxetine  stay at 20 mg until the increase in Viibryd can have benefit continue Viibryd with 350 calories to 40 mg daily  02/06/21 appt with following noted: Viibryd 40 and paxil 20. Stopped lithium after 3 weeks DT diarrhea and NR. Managing but not great.  Not dramatic improvement but thinks overall it is better than with Paxil.   Less depressed than 6  mos ago.  But anxiety over normal activities even in grocery store or waiting room. Working hard.  Managing meetings.   Plan: Reduce Paxil to 10 mg for 2 weeks and stop it Increase Viibryd to 60 mg daily for better anxiety effects  04/03/2021 appointment with following noted: Couldn't afford Viibryd at $200/mo so dropped down to 40 mg daily. More crying on Viibryd even sobbing if not at work.  Feels very alone and hopeless.  Recurring dream disturbing with homesick feelings. Death thoughts without sui intent or plan.  Sleep good. Can do ok around people.  Able to respond to respond to positive stimuli. Plan:  Reduce Viibryd to 1/2 tablet 20 mg and start nortriptyline 25 mg 1 at night for 5 nights then, Continue Viibryd 20 mg daily and increase nortriptyline 2 of the 25 mg capsules for 5 nights, then Stop Viibryd and increase nortriptyline to 3 of the 25 mg capsules nightly. Wait 1 week and get the blood test in the morning. Disc SE  05/19/2021 appointment with the following noted: Cautiously optimistic with change to nortriptyline.  No longer homesick feeling, more confident and less crying.  Still doesn't like going anywhere.  Trying to reduce clonazepam.  Last Saturday 60th BD and really down bc kids didn't come to surprise him..  Lonely. PE last week was good. Handling going out easier.   Been on nortriptyline 75 mg for a month. No full panic lately.  Still leaving house and driving causes anxiety. No SE except 2-3 nights of indigestion. Sleep is good. Plan: Lithium augmentation option to try lower dose 150 bc diarrhea with 300 mg. Yes.  06/18/21 appt noted: Mood depression less severe without crying.  Anxiety still a problem.  Not happy or sad.  Nervous driving. Worrying PD may be worse. Clonazepam 1 mg does not cause sleepiness, dizziness. No particular effect of lihtium 150. Appetite reduced.   Propranolol keeps BP down.  Doing AA by internet.  Sober since  2006.  No history of BZ dependence. Asked about BZ for severe anxiety.  Past Psychiatric Medication Trials:  Brief duloxetine, sertraline 200, paroxetine 80, Viibryd 40, nortriptyline 75 buspirone dizzy, gabapentin,  Lorazepam , clonazepam is better Hydroxyzine  Propranolol 40 BID Lithium 300 diarrhea Abilify helped the depression and sense of dread but nothing for anxiety. Dx PD so can't use risperidone  Review of Systems:   Review of Systems  Constitutional:  Positive for unexpected weight change. Negative for fatigue.  Cardiovascular:  Negative for chest pain and palpitations.  Gastrointestinal:  Negative for diarrhea and nausea.  Musculoskeletal:  Negative for arthralgias.  Neurological:  Positive for dizziness and tremors. Negative for weakness.  Psychiatric/Behavioral:  Positive for depression and dysphoric mood. Negative for agitation, behavioral problems, confusion, decreased concentration, hallucinations, self-injury, sleep disturbance and suicidal ideas. The patient is nervous/anxious. The patient is not hyperactive.    Medications: I have reviewed the patient's current medications.  Current Outpatient Medications  Medication Sig Dispense Refill   aspirin EC 81 MG tablet Take 1 tablet (81 mg total) by mouth 2 (two) times daily. (Patient taking differently: Take 81 mg by mouth daily.) 60 tablet 0   carbidopa-levodopa (SINEMET IR) 25-100 MG tablet Take 1 tablet by mouth 3 (three) times daily before meals. 270 tablet 4   clonazePAM (KLONOPIN) 1 MG tablet TAKE 1 TABLET(1 MG) BY MOUTH TWICE DAILY 60 tablet 0   hydrOXYzine (ATARAX/VISTARIL) 25 MG tablet TAKE 1 TABLET BY MOUTH THREE TIMES DAILY AND 1-2 TABLETS  AT BEDTIME AS NEEDED FOR ANXIETY OR INSOMNIA 150 tablet 2   lamoTRIgine (LAMICTAL) 150 MG tablet TAKE 1 TABLET(150 MG) BY MOUTH AT BEDTIME 90 tablet 0   lisinopril-hydrochlorothiazide (PRINZIDE,ZESTORETIC) 20-12.5 MG per tablet Take 1 tablet by mouth 2 (two) times daily.      lithium carbonate 150 MG capsule Take 1 capsule (150 mg total) by mouth daily. 90 capsule 0   meloxicam (MOBIC) 15 MG tablet Take 15 mg by mouth daily.     methocarbamol (ROBAXIN) 500 MG tablet Take 500 mg by mouth 3 (three) times daily.     Multiple Vitamins-Minerals (MULTIVITAMIN WITH MINERALS) tablet Take 1 tablet by mouth daily.     nortriptyline (PAMELOR) 25 MG capsule Take 3 capsules (75 mg total) by mouth at bedtime. 90 capsule 1   omeprazole (PRILOSEC) 20 MG capsule Take 20 mg by mouth 2 (two) times daily.     propranolol (INDERAL) 40 MG tablet Take 2 tablets (80 mg total) by mouth 2 (two) times daily. (Patient taking differently: Take 80 mg by mouth daily.) 360 tablet 0   simvastatin (ZOCOR) 40 MG tablet Take 40 mg by mouth daily.     No current facility-administered medications for this visit.    Medication Side Effects: None  Allergies: No Known Allergies  Past Medical History:  Diagnosis Date   Alcoholism (HCC)    sober since 01-2011    Anxiety    Arthritis    Balance problem    Complication of anesthesia    unsuccessful spinal with right knee replacement, used general anesthesia   Depression    Difficulty concentrating    Diverticulosis 2012   GERD (gastroesophageal reflux disease)    Hemorrhoids    Hyperlipidemia    Hypertension    Internal hemorrhoids 2012   Obsessive compulsive disorder    Osteoarthritis    Rotator cuff tear, left    Tear of right rotator cuff 08/03/2014   Wears contact lenses     Family History  Problem Relation Age of Onset   Stroke Mother    Diabetes Mother    Cancer Father    Parkinson's disease Maternal Grandmother    Colon cancer Neg Hx     Social History   Socioeconomic History   Marital status: Divorced    Spouse name: Not on file   Number of children: 3   Years of education: Not on file   Highest education level: Bachelor's degree (e.g., BA, AB, BS)  Occupational History   Occupation: Tree surgeon  Tobacco Use    Smoking status: Former    Types: E-cigarettes    Quit date: 08/01/2009    Years since quitting: 11.8   Smokeless tobacco: Never  Vaping Use   Vaping Use: Every day   Start date: 08/12/2014  Substance and Sexual Activity   Alcohol use: No    Comment: not since 2006   Drug use: No    Comment: qut 1996   Sexual activity: Not on file    Comment: uses e-cig  Other Topics Concern   Not on file  Social History Narrative   Lives alone   No caffeine   Social Determinants of Health   Financial Resource Strain: Not on file  Food Insecurity: Not on file  Transportation Needs: Not on file  Physical Activity: Not on file  Stress: Not on file  Social Connections: Not on file  Intimate Partner Violence: Not on file    Past Medical History, Surgical history, Social  history, and Family history were reviewed and updated as appropriate.   Please see review of systems for further details on the patient's review from today.   Objective:   Physical Exam:  There were no vitals taken for this visit.  Physical Exam Constitutional:      General: He is not in acute distress. Musculoskeletal:        General: No deformity.  Neurological:     Mental Status: He is alert and oriented to person, place, and time.     Cranial Nerves: No dysarthria.     Coordination: Coordination normal.  Psychiatric:        Attention and Perception: Attention and perception normal. He does not perceive auditory or visual hallucinations.        Mood and Affect: Mood is anxious and depressed. Affect is not labile, blunt, angry or inappropriate.        Speech: Speech normal.        Behavior: Behavior normal. Behavior is cooperative.        Thought Content: Thought content normal. Thought content is not paranoid or delusional. Thought content does not include homicidal or suicidal ideation. Thought content does not include homicidal or suicidal plan.        Cognition and Memory: Cognition and memory normal.         Judgment: Judgment normal.     Comments: Talkative. Ongoing severe anxiety but depression is improved.    Lab Review:     Component Value Date/Time   NA 136 08/02/2018 0458   K 5.0 08/02/2018 0458   CL 102 08/02/2018 0458   CO2 27 08/02/2018 0458   GLUCOSE 138 (H) 08/02/2018 0458   BUN 26 (H) 08/02/2018 0458   CREATININE 1.10 08/02/2018 0458   CALCIUM 9.0 08/02/2018 0458   PROT 7.6 08/12/2017 1445   ALBUMIN 4.3 08/12/2017 1445   AST 29 08/12/2017 1445   ALT 26 08/12/2017 1445   ALKPHOS 60 08/12/2017 1445   BILITOT 1.0 08/12/2017 1445   GFRNONAA >60 08/02/2018 0458   GFRAA >60 08/02/2018 0458       Component Value Date/Time   WBC 14.6 (H) 08/02/2018 0458   RBC 4.06 (L) 08/02/2018 0458   HGB 12.9 (L) 08/02/2018 0458   HCT 37.8 (L) 08/02/2018 0458   PLT 205 08/02/2018 0458   MCV 93.1 08/02/2018 0458   MCH 31.8 08/02/2018 0458   MCHC 34.1 08/02/2018 0458   RDW 12.9 08/02/2018 0458   LYMPHSABS 1.4 07/26/2018 0954   MONOABS 0.6 07/26/2018 0954   EOSABS 0.2 07/26/2018 0954   BASOSABS 0.0 07/26/2018 0954    No results found for: POCLITH, LITHIUM   No results found for: PHENYTOIN, PHENOBARB, VALPROATE, CBMZ   04/23/2021 nortriptyline level 113.  No med change indicated.  On nortriptyline 75 mg daily  .res Assessment: Plan:    Eeshan was seen today for follow-up, depression and anxiety.  Diagnoses and all orders for this visit:  GAD (generalized anxiety disorder)  Panic disorder with agoraphobia  Moderately severe recurrent major depression (HCC)  Social anxiety disorder  PTSD (post-traumatic stress disorder)  Alcohol dependence, in remission (HCC)  Greater than 50% of 30 min face to face time with patient was spent on counseling and coordination of care. We discussed Patient stated he has been on antidepressants very long time and wondered what his emotional state would be like off the medication.  This was partly influenced by online patient for him to  described problems with  withdrawal coming off of SSRIs.  He successfully came off antidepressant without withdrawal symptoms.  But then has had relapse of both depression and anxiety within a few weeks off the antidepressant.  SX have been severe and persistent but is seeing  improvement from nortriptyline which is significant for depression but not much forr anxiety.  Anxierty not managed still but is better in some areas. Limited options if trying to avoid atypicals DT PD.  Consider off label clonidine Clonidine 0.05-0.1 mg AM and then 0.1 mg BID  Consider TMS, Spravato, ECT, TCA, MAOI, etc.45 min discussion details of each.  Consider lithium, mirtazapine, Remeron, trazodone, Deplin .  Continue Lithium augmentation 150 bc diarrhea with 300 mg. Yes.  Transcranial Magnetic Stimulation TMS- [facility], [place] Other Option is Spravato ( nasal spray ketamine)  Switch to nortriptyline 75 HS led to improvement. 04/23/2021 nortriptyline level 113.  No med change indicated.  On nortriptyline 75 mg daily Disc SE  Continue propranolol to 60-80 mg TID for anxiety  Klonopin 1 mg BID  Disc risk triggering alcohol abuse.  He wants it bc feels desperate.  Disc fall risk.  We discussed the short-term risks associated with benzodiazepines including sedation and increased fall risk among others.  Discussed long-term side effect risk including dependence, potential withdrawal symptoms, and the potential eventual dose-related risk of dementia.  But recent studies from 2020 dispute this association between benzodiazepines and dementia risk. Newer studies in 2020 do not support an association with dementia. He doesn't notice SE He needs this mainly to leave the house.  hydroxyzine 25 mg TID prn anxiety and it helps And 1-2 hs prn insomnia. Continue gabapentin.    Discussed his chronic thoughts about death. They resolved so far.  He commits to safety and is not having suicidal thoughts.  Alcohol dependence in  remission and has meetings.   Disc BZ.   Extensive discussion of how Covid has allowed social anxiety to get worse but is functional.   Genesight DT TR sx.  Reviewed results at length.     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  Continue AA.    Rec continue counseling for therapy.  Disc vitamin D in detail.  Vitamin D 5000 units daily until April 1  Follow-up 4 weeks  Meredith Staggers, MD, DFAPA     Please see After Visit Summary for patient specific instructions.  Future Appointments  Date Time Provider Department Center  07/30/2021  1:30 PM Cottle, Steva Ready., MD [facility] None  11/11/2021 11:30 AM Penumalli, Glenford Bayley, MD [facility] None    No orders of the defined types were placed in this encounter.     -------------------------------

## 2021-07-07 ENCOUNTER — Other Ambulatory Visit: Payer: Self-pay | Admitting: Psychiatry

## 2021-07-07 ENCOUNTER — Telehealth: Payer: Self-pay | Admitting: Psychiatry

## 2021-07-07 DIAGNOSIS — F4010 Social phobia, unspecified: Secondary | ICD-10-CM

## 2021-07-07 DIAGNOSIS — F4001 Agoraphobia with panic disorder: Secondary | ICD-10-CM

## 2021-07-07 DIAGNOSIS — F411 Generalized anxiety disorder: Secondary | ICD-10-CM

## 2021-07-07 NOTE — Telephone Encounter (Signed)
Adam Harvey called to request refill of his clonazepam.  I told him to call his pharmacy but he said they are bad about sending in request until the last minute.  He will run out on Thursday which they won't send the request until Friday.  He said he would call them, but wants to make sure he doesn't run out over the holiday weekend.  Has appt 07/30/21.  Send to  [facility] on [address]

## 2021-07-07 NOTE — Telephone Encounter (Signed)
Last filled 10/24 appt on 12/14

## 2021-07-07 NOTE — Telephone Encounter (Signed)
Pended.

## 2021-07-14 ENCOUNTER — Other Ambulatory Visit: Payer: Self-pay | Admitting: Psychiatry

## 2021-07-17 ENCOUNTER — Other Ambulatory Visit: Payer: Self-pay | Admitting: Psychiatry

## 2021-07-17 DIAGNOSIS — F332 Major depressive disorder, recurrent severe without psychotic features: Secondary | ICD-10-CM

## 2021-07-21 ENCOUNTER — Telehealth: Payer: Self-pay | Admitting: Diagnostic Neuroimaging

## 2021-07-21 MED ORDER — CARBIDOPA-LEVODOPA 25-100 MG PO TABS: 2.0000 | ORAL_TABLET | Freq: Three times a day (TID) | ORAL | 4 refills | Status: DC

## 2021-07-21 NOTE — Telephone Encounter (Signed)
Pt called states there is an increase in his shakiness, more so in left hand than anything. Pt states he's had to increase the amount of his carbidopa-levodopa (SINEMET IR) 25-100 MG tablet bu taking 2 in the morning to get the shaking to stop. Pt requesting a call back.

## 2021-07-21 NOTE — Telephone Encounter (Signed)
Contacted pt back, informed him dr Marjory Lies recommends he May increase carb / levo up to 2 tabs three times a day, over time for symptom control. Pt asked for new Rx, rx sent into pharmacy.

## 2021-07-21 NOTE — Telephone Encounter (Signed)
Pt self increased medication from 1 to 2 in morning, any other suggestions? Please advise

## 2021-07-22 ENCOUNTER — Encounter: Payer: Self-pay | Admitting: Internal Medicine

## 2021-07-27 ENCOUNTER — Other Ambulatory Visit: Payer: Self-pay | Admitting: Psychiatry

## 2021-07-27 DIAGNOSIS — F411 Generalized anxiety disorder: Secondary | ICD-10-CM

## 2021-07-27 DIAGNOSIS — F4001 Agoraphobia with panic disorder: Secondary | ICD-10-CM

## 2021-07-27 DIAGNOSIS — F4310 Post-traumatic stress disorder, unspecified: Secondary | ICD-10-CM

## 2021-07-30 ENCOUNTER — Other Ambulatory Visit: Payer: Self-pay

## 2021-07-30 ENCOUNTER — Ambulatory Visit: Payer: 59 | Admitting: Psychiatry

## 2021-07-30 ENCOUNTER — Encounter: Payer: Self-pay | Admitting: Psychiatry

## 2021-07-30 DIAGNOSIS — F411 Generalized anxiety disorder: Secondary | ICD-10-CM

## 2021-07-30 DIAGNOSIS — F332 Major depressive disorder, recurrent severe without psychotic features: Secondary | ICD-10-CM | POA: Diagnosis not present

## 2021-07-30 DIAGNOSIS — F4001 Agoraphobia with panic disorder: Secondary | ICD-10-CM | POA: Diagnosis not present

## 2021-07-30 DIAGNOSIS — F4310 Post-traumatic stress disorder, unspecified: Secondary | ICD-10-CM

## 2021-07-30 DIAGNOSIS — F1021 Alcohol dependence, in remission: Secondary | ICD-10-CM

## 2021-07-30 DIAGNOSIS — F4010 Social phobia, unspecified: Secondary | ICD-10-CM | POA: Diagnosis not present

## 2021-07-30 NOTE — Patient Instructions (Signed)
Constipation management 1.  Lots of water 2.  Powdered fiber supplement such as MiraLAX, Citrucel, etc. preferably with a meal 3.  2 stool softeners a day 4.  Milk of magnesia or magnesium tablets if needed  

## 2021-07-30 NOTE — Progress Notes (Signed)
Adam Harvey [id number] 04-Feb-1961 60 y.o.    Subjective:   Patient ID:  Adam Harvey is a 60 y.o. (DOB March 21, 1961) male.  Chief Complaint:  Chief Complaint  Patient presents with   Follow-up   Depression   Anxiety    Anxiety Symptoms include dizziness and nervous/anxious behavior. Patient reports no chest pain, confusion, decreased concentration, nausea, palpitations or suicidal ideas.    Medication Refill Pertinent negatives include no arthralgias, chest pain, fatigue, nausea or weakness.  Depression        Associated symptoms include no decreased concentration, no fatigue and no suicidal ideas.  Past medical history includes anxiety.   Adam Harvey presents to the office today for follow-up of several diagnoses and wanted an earlier appointment because he wanted to restart duloxetine.  visit January 16, 2019.  He had wanted a trial off of antidepressants in part because of reading on the Internet about withdrawal symptoms.  He had been off antidepressants for 2 months.  And felt more anxious and depressed.  We discussed it and decided to return to duloxetine and increase to 60 mg daily.  visit in July after being back on the medication of duloxetine 60 mg daily he reported the following: It's great.  No depression but some anxiety that is managed.  He's 9/10 and satisfied.  Pleased with the dosage..  Tolerated well.  Sleep is good.  Better self care and better eating.  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  Energy better. Good appetite.  For years has had thoughts of if got terminal disease it would be easier and they are worse.  Hopelessness resolved. He had wanted to consider tapering off gabapentin and was given permission to do that if he chose to do so. He was continued on lamotrigine 150 mg daily.  visit October 16 2019.   Don't feel depressed and kind of happy but lingering thoughts of "is this as good as it gets?".  Not consuming him.  Not  much excitement about things except work.  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  Some anxiety about being around people. Not DT Covid unless it made it worse.  Prefer to be alone.  Did a lot of theater in the past but not excited like he used to be.  Likes yard work.  Not interested in dating.  Wants to talk about Gabapentin for pain.  Previously thought it helped.  Wants to try to stop it and see if he can do without it.  Knees don't hurt bc replaced. Plan:  Cont duloxetine 60 and add Abilify 5 Taper gabapentin off  12/25/19 appt, the following is noted: Anxiety not good.  Weary of dealing with it.  Had trouble dealing with number of people on the beach this weekend DT anxiety.  At times would like a BZ.  Always felt anxious but not managing it well.  Isolating. No SE gabapentin.  Did not stop it bc he forgot it. Plan: Increase gabapentin to 300 BID and 600 HS for 2 days, then incrase to 2 in AM  And HS and 1 in middle of the day for 4 days, then increase to 600 TID.  For a week, if NR then increase to 900  TID.    02/08/2020 appointment with the following noted: Increased gabapentin to 600 mg BID.  Has to nap if takes more.  No less anxious with the increase in dose.  No benefit to it. Asks about BZ.  Doesn't really want to leave the house and anxiety driving.  Can get anxious even in yard bc afraid of having to talk to people.  No paranoia. Dread of things.  Emotional eating. Plan: Switch to paroxetine 30 mg for better antianxiety effect and hopefully help depression a little more.  Start one half of paroxetine 20 mg tablet and reduce duloxetine to 2 of the 20 mg capsules for 5 days, Then increase paroxetine to 1 tablet daily and reduce duloxetine to 1 capsule daily for 5 days, Then increase to 1-1/2 paroxetine tablets daily and stop duloxetine Continue Abilify 5 mg augmentation for residual anhedonia.   03/08/2020 phone call patient stating he felt Paxil was causing tremors in hands and feet  and not helping with anxiety.  He was instructed to reduce paroxetine to 20 mg daily and stop Abilify for 3 days and then restart Abilify 1/2 tablet. 03/20/2020 phone call patient stating the above changes made no difference in tremor.  He was instructed to stop Abilify and increase paroxetine to 40 mg daily. 04/01/2020 patient called stating his PCP wanted him to stop taking paroxetine completely to see if the tremors would resolve. 05/01/2020 patient phone call stating he was diagnosed with Parkinson's disease but because of anxiety needed to be on some medication he was encouraged to restart the paroxetine which had never had an adequate trial for his anxiety. 06/03/2020 phone call from patient complaining of panic attacks.  He had increase paroxetine to 40 mg a day and was given hydroxyzine as needed.  06/07/2020 urgent appointment with the following noted: Not well.  Depression as bad as ever and anxiety through the roof.  Worse beginning of the week bc feels completely overwhelming.   Getting OOB, brushing teeth is hard, being in public hard. Social avoidance affecting work.  Don't feel scared of PD but slap in the face about age.  Single and in middle of 5 year bankruptcy.  Trouble with concentration. Not suicidal but death thoughts. Yesterday a good day and laughed first time in a while. Poor appetite and lost weight. On paroxetine 40 mg for 3 weeks. No SE.   Trouble staying asleep. 4-5  Hours. Plan: Needs more time to respond to paroxetine 40 mg daily Deplin 15 mg daily for depression  With samples. Increase hydroxyzine for ST anxiety.  07/10/2020 appointment with the following noted: Depression is better but anxiety is through the roof.  Can laugh again.  Less hopeless and paralyzed by depression.  Now 5/10 depression.  Anxiety is still 10/10.  Will have nausea going out.  Has managed the anxiety pretty well.  Wonders if PD is causing anxiety.  Over the last 2-3 years more social anxiety.   No  SE with Paxil. Hydroxyzine TID and 2 HS.  Lorazepam helps but manages it. Plan: Increase paroxetine 60 mg daily.  It helped depression but not anxiety so far.  08/06/20 appt with following noted: Cant tell a difference.  Anxiety still up.  BP up with stress.  Takes Ativan prn. Started hydroxyzine 75 HS and sleeping better.  If EMA then hard to get back to sleep.  Not anxious at night typically.  Around people tense and heart races. I don't feel as depressed but no decorations for Xmas for the first time.  Typically after Jan 1 falls into depression and a bit anxious over it. Plan: Continue paroxetine 60 mg daily at least another month.  It helped depression but not anxiety so far. Increase propranolol  to 60-80 mg TID for anxiety  09/05/2020 appointment with following noted: Still awakens terrified of the world and hard to get OOB. Depression is still a bit of a problem. Going anywhere to deal with anyone makes him very anxious to the point of nausea.  When takes lorazepam it eases it off rather than eliminating it. Lorazepam 1 mg avg daily.  Really bad Xmas.  Even to the point of scared to go out with kids.  Once out he does ok usually. SE a little tired.  Not severe.   Hydroxyzine during day and 75 mg at night for sleep and it helps. No libido. Dx PD so can't use risperidone Plan: Increase paroxetine 80 mg daily at least another month.  It helped depression but not anxiety so far.  10/10/2020 appointment with the following noted: Anxiety no better.  Hopeless and "waiting to die".  No joy or relaxation around the corner. Lorazepam helps otherwise shaking and sweating.  Cancelled appts yesterday.  Likes there's something inside the body that's twisting. Plan no med changes.  We will give paroxetine 80 mg another month or so to help.  11/06/2020 appointment with the following noted: I still have pit in stomach but anxiety is better with less shaking sweating, SOB.  Still don't want to go out  much.  Still a lot of worry.  Once home in PM then can let go easier.  Goes to bed at 9 and sleeps good.  Normal function. No change in hopelessness.  No joy or looking forward.  I feel sad and lonely a good part of the time.  Enjoyed football game. Neuro yesterday doing well with early stage PD. Plan: Reduce paroxetine by 50% every 2 weeks down to 20 mg and stay at 20 mg. Start Viibryd 10 mg daily with 350 calories for 1 week then increase to 20 mg daily  12/10/20 appt noted:  Problem with cost Viibryd. Continued with Viibryd 20 mg daily and down to paroxetine 20 mg daily without withdrawal.  The terror of the outside world continues.  Still anxious around most other people and going out. For the first time in a long while,  over the last week, he felt joy and connection and he even made other people laugh.  Had less anxiety around them than usual.  Less hopeless.  Managing work better. Found copay card and last RX $45.   No SE and no withdrawal.  Plan:  paroxetine  stay at 20 mg until the increase in Viibryd can have benefit Increase Viibryd with 350 calories to 40 mg daily DC Deplin due to no response   01/07/2021 appointment with the following noted: The fear of the world remains but significantly less hopeless.  Very anxious over bankruptcy hearing today.  18 mos left on dealing with that. Son graduated from law-school recently.  He got 2 awards.  Left after he walked across the stage.  Couldn't stay in the crowd later.  Ongoing panic with agoraphobia.   Less death thoughts but still some. Works from house and stays there.   Tolerated Viibryd well.   NO SE Needs Klonopin and hydroxyzine. Plan: paroxetine  stay at 20 mg until the increase in Viibryd can have benefit continue Viibryd with 350 calories to 40 mg daily  02/06/21 appt with following noted: Viibryd 40 and paxil 20. Stopped lithium after 3 weeks DT diarrhea and NR. Managing but not great.  Not dramatic improvement but  thinks overall it is better than  with Paxil.  Less depressed than 6 mos ago.  But anxiety over normal activities even in grocery store or waiting room. Working hard.  Managing meetings.   Plan: Reduce Paxil to 10 mg for 2 weeks and stop it Increase Viibryd to 60 mg daily for better anxiety effects  04/03/2021 appointment with following noted: Couldn't afford Viibryd at $200/mo so dropped down to 40 mg daily. More crying on Viibryd even sobbing if not at work.  Feels very alone and hopeless.  Recurring dream disturbing with homesick feelings. Death thoughts without sui intent or plan.  Sleep good. Can do ok around people.  Able to respond to respond to positive stimuli. Plan:  Reduce Viibryd to 1/2 tablet 20 mg and start nortriptyline 25 mg 1 at night for 5 nights then, Continue Viibryd 20 mg daily and increase nortriptyline 2 of the 25 mg capsules for 5 nights, then Stop Viibryd and increase nortriptyline to 3 of the 25 mg capsules nightly. Wait 1 week and get the blood test in the morning. Disc SE  05/19/2021 appointment with the following noted: Cautiously optimistic with change to nortriptyline.  No longer homesick feeling, more confident and less crying.  Still doesn't like going anywhere.  Trying to reduce clonazepam.  Last Saturday 60th BD and really down bc kids didn't come to surprise him..  Lonely. PE last week was good. Handling going out easier.   Been on nortriptyline 75 mg for a month. No full panic lately.  Still leaving house and driving causes anxiety. No SE except 2-3 nights of indigestion. Sleep is good. Plan: Lithium augmentation option to try lower dose 150 bc diarrhea with 300 mg. Yes.  06/18/21 appt noted: Mood depression less severe without crying.  Anxiety still a problem.  Not happy or sad.  Nervous driving. Worrying PD may be worse. Clonazepam 1 mg does not cause sleepiness, dizziness. No particular effect of lihtium 150. Appetite reduced.   Propranolol keeps  BP down. Plan clonidine trial for anxiety  07/30/2021 appt noted: Sees benefit with clonidine 0.1 mg BID.  More at ease.  Still can worry.  Takes clonazepam prn.  Average 1.5 mg daily.  I feel better.  Less hopeless and enjoys things more.  Often depression in January. SE constipation and dry mouth.   Doing AA by internet.  Sober since 2006.  No history of BZ dependence. Asked about BZ for severe anxiety.  Past Psychiatric Medication Trials:  Brief duloxetine, sertraline 200, paroxetine 80, Viibryd 40, nortriptyline 75 buspirone dizzy, gabapentin,  Lorazepam , clonazepam is better Hydroxyzine  Propranolol 40 BID Lithium 300 diarrhea Abilify helped the depression and sense of dread but nothing for anxiety. Dx PD so can't use risperidone  Review of Systems:   Review of Systems  Constitutional:  Positive for unexpected weight change. Negative for fatigue.  Cardiovascular:  Negative for chest pain and palpitations.  Gastrointestinal:  Positive for constipation. Negative for diarrhea and nausea.  Musculoskeletal:  Negative for arthralgias.  Neurological:  Positive for dizziness and tremors. Negative for weakness.  Psychiatric/Behavioral:  Positive for depression and dysphoric mood. Negative for agitation, behavioral problems, confusion, decreased concentration, hallucinations, self-injury, sleep disturbance and suicidal ideas. The patient is nervous/anxious. The patient is not hyperactive.    Medications: I have reviewed the patient's current medications.  Current Outpatient Medications  Medication Sig Dispense Refill   aspirin EC 81 MG tablet Take 1 tablet (81 mg total) by mouth 2 (two) times daily. (Patient taking differently: Take 66  mg by mouth daily.) 60 tablet 0   carbidopa-levodopa (SINEMET IR) 25-100 MG tablet Take 2 tablets by mouth 3 (three) times daily before meals. 270 tablet 4   clonazePAM (KLONOPIN) 1 MG tablet TAKE 1 TABLET(1 MG) BY MOUTH TWICE DAILY 60 tablet 0    cloNIDine (CATAPRES) 0.1 MG tablet 1/2 tablet in the AM for 1 week, then 1 tablet in the AM for a week then 1 in AM and 1/2 in PM for a week then 1 twice daily (Patient taking differently: Take 0.1 mg by mouth 2 (two) times daily.) 60 tablet 1   hydrOXYzine (ATARAX) 25 MG tablet TAKE 1 TABLET BY MOUTH THREE TIMES DAILY AND AN ADDITIONAL 1-2 TABLETS AT BEDTIME AS NEEDED FOR ANXIETY OR INSOMNIA 150 tablet 0   lamoTRIgine (LAMICTAL) 150 MG tablet TAKE 1 TABLET(150 MG) BY MOUTH AT BEDTIME 30 tablet 0   lisinopril-hydrochlorothiazide (PRINZIDE,ZESTORETIC) 20-12.5 MG per tablet Take 1 tablet by mouth 2 (two) times daily.     lithium carbonate 150 MG capsule Take 1 capsule (150 mg total) by mouth daily. 90 capsule 0   meloxicam (MOBIC) 15 MG tablet Take 15 mg by mouth daily.     methocarbamol (ROBAXIN) 500 MG tablet Take 500 mg by mouth 3 (three) times daily.     Multiple Vitamins-Minerals (MULTIVITAMIN WITH MINERALS) tablet Take 1 tablet by mouth daily.     nortriptyline (PAMELOR) 25 MG capsule TAKE 3 CAPSULES(75 MG) BY MOUTH AT BEDTIME 90 capsule 0   omeprazole (PRILOSEC) 20 MG capsule Take 20 mg by mouth 2 (two) times daily.     propranolol (INDERAL) 40 MG tablet Take 2 tablets (80 mg total) by mouth 2 (two) times daily. 360 tablet 0   simvastatin (ZOCOR) 40 MG tablet Take 40 mg by mouth daily.     No current facility-administered medications for this visit.    Medication Side Effects: None  Allergies: No Known Allergies  Past Medical History:  Diagnosis Date   Alcoholism (HCC)    sober since 01-2011    Anxiety    Arthritis    Balance problem    Complication of anesthesia    unsuccessful spinal with right knee replacement, used general anesthesia   Depression    Difficulty concentrating    Diverticulosis 2012   GERD (gastroesophageal reflux disease)    Hemorrhoids    Hyperlipidemia    Hypertension    Internal hemorrhoids 2012   Obsessive compulsive disorder    Osteoarthritis     Rotator cuff tear, left    Tear of right rotator cuff 08/03/2014   Wears contact lenses     Family History  Problem Relation Age of Onset   Stroke Mother    Diabetes Mother    Cancer Father    Parkinson's disease Maternal Grandmother    Colon cancer Neg Hx     Social History   Socioeconomic History   Marital status: Divorced    Spouse name: Not on file   Number of children: 3   Years of education: Not on file   Highest education level: Bachelor's degree (e.g., BA, AB, BS)  Occupational History   Occupation: Tree surgeon  Tobacco Use   Smoking status: Former    Types: E-cigarettes    Quit date: 08/01/2009    Years since quitting: 12.0   Smokeless tobacco: Never  Vaping Use   Vaping Use: Every day   Start date: 08/12/2014  Substance and Sexual Activity   Alcohol use: No  Comment: not since 2006   Drug use: No    Comment: qut 1996   Sexual activity: Not on file    Comment: uses e-cig  Other Topics Concern   Not on file  Social History Narrative   Lives alone   No caffeine   Social Determinants of Health   Financial Resource Strain: Not on file  Food Insecurity: Not on file  Transportation Needs: Not on file  Physical Activity: Not on file  Stress: Not on file  Social Connections: Not on file  Intimate Partner Violence: Not on file    Past Medical History, Surgical history, Social history, and Family history were reviewed and updated as appropriate.   Please see review of systems for further details on the patient's review from today.   Objective:   Physical Exam:  There were no vitals taken for this visit.  Physical Exam Constitutional:      General: He is not in acute distress. Musculoskeletal:        General: No deformity.  Neurological:     Mental Status: He is alert and oriented to person, place, and time.     Cranial Nerves: No dysarthria.     Coordination: Coordination normal.  Psychiatric:        Attention and Perception: Attention  and perception normal. He does not perceive auditory or visual hallucinations.        Mood and Affect: Mood is depressed. Mood is not anxious. Affect is not labile, blunt, angry or inappropriate.        Speech: Speech normal.        Behavior: Behavior normal. Behavior is cooperative.        Thought Content: Thought content normal. Thought content is not paranoid or delusional. Thought content does not include homicidal or suicidal ideation. Thought content does not include homicidal or suicidal plan.        Cognition and Memory: Cognition and memory normal.        Judgment: Judgment normal.     Comments: Talkative. Ongoing severe anxiety much better and depression is improved.    Lab Review:     Component Value Date/Time   NA 136 08/02/2018 0458   K 5.0 08/02/2018 0458   CL 102 08/02/2018 0458   CO2 27 08/02/2018 0458   GLUCOSE 138 (H) 08/02/2018 0458   BUN 26 (H) 08/02/2018 0458   CREATININE 1.10 08/02/2018 0458   CALCIUM 9.0 08/02/2018 0458   PROT 7.6 08/12/2017 1445   ALBUMIN 4.3 08/12/2017 1445   AST 29 08/12/2017 1445   ALT 26 08/12/2017 1445   ALKPHOS 60 08/12/2017 1445   BILITOT 1.0 08/12/2017 1445   GFRNONAA >60 08/02/2018 0458   GFRAA >60 08/02/2018 0458       Component Value Date/Time   WBC 14.6 (H) 08/02/2018 0458   RBC 4.06 (L) 08/02/2018 0458   HGB 12.9 (L) 08/02/2018 0458   HCT 37.8 (L) 08/02/2018 0458   PLT 205 08/02/2018 0458   MCV 93.1 08/02/2018 0458   MCH 31.8 08/02/2018 0458   MCHC 34.1 08/02/2018 0458   RDW 12.9 08/02/2018 0458   LYMPHSABS 1.4 07/26/2018 0954   MONOABS 0.6 07/26/2018 0954   EOSABS 0.2 07/26/2018 0954   BASOSABS 0.0 07/26/2018 0954    No results found for: POCLITH, LITHIUM   No results found for: PHENYTOIN, PHENOBARB, VALPROATE, CBMZ   04/23/2021 nortriptyline level 113.  No med change indicated.  On nortriptyline 75 mg daily  .res Assessment: Plan:  Adam Harvey was seen today for follow-up, depression and anxiety.  Diagnoses  and all orders for this visit:  GAD (generalized anxiety disorder)  Panic disorder with agoraphobia  Social anxiety disorder  Moderately severe recurrent major depression (HCC)  PTSD (post-traumatic stress disorder)  Alcohol dependence, in remission (HCC)  Greater than 50% of 30 min face to face time with patient was spent on counseling and coordination of care. We discussed Patient stated he has been on antidepressants very long time and wondered what his emotional state would be like off the medication.  This was partly influenced by online patient for him to described problems with withdrawal coming off of SSRIs.  He successfully came off antidepressant without withdrawal symptoms.  But then has had relapse of both depression and anxiety within a few weeks off the antidepressant.  SX have been severe and persistent but is seeing  improvement from nortriptyline which is significant for depression but not much forr anxiety.  Anxierty is better markedly with clonidine. Limited options if trying to avoid atypicals DT PD.  Cant' increase bc blood pressure Clonidine  0.1 mg BID was helpful.  Consider TMS, Spravato, ECT, TCA, MAOI, etc.45 min discussion details of each.  Consider lithium, mirtazapine, Remeron, trazodone, Deplin .  Continue Lithium augmentation 150 bc diarrhea with 300 mg. Yes.  Transcranial Magnetic Stimulation TMS- [facility], [place] Other Option is Spravato ( nasal spray ketamine)  Switch to nortriptyline 75 HS led to improvement. 04/23/2021 nortriptyline level 113.  No med change indicated.  On nortriptyline 75 mg daily Disc SE  Reduce propranolol to 40 mg BID bc borderline low BP  Klonopin 1 mg BID  Disc risk triggering alcohol abuse.  He wants it bc feels desperate.  Disc fall risk.  We discussed the short-term risks associated with benzodiazepines including sedation and increased fall risk among others.  Discussed long-term side effect risk including dependence,  potential withdrawal symptoms, and the potential eventual dose-related risk of dementia.  But recent studies from 2020 dispute this association between benzodiazepines and dementia risk. Newer studies in 2020 do not support an association with dementia. He doesn't notice SE He needs this mainly to leave the house.  Try to reduce hydroxyzine 25 mg TID prn anxiety and it helps And 1-2 hs prn insomnia. Continue gabapentin.    Discussed his chronic thoughts about death. They resolved so far.  He commits to safety and is not having suicidal thoughts.  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   Disc BZ.   Extensive discussion of how Covid has allowed social anxiety to get worse but is functional.   Genesight DT TR sx.  Reviewed results at length.     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  Continue AA.    Rec continue counseling for therapy.  Disc vitamin D in detail.  Vitamin D 5000 units daily until April 1  Constipation management 1.  Lots of water 2.  Powdered fiber supplement such as MiraLAX, Citrucel, etc. preferably with a meal 3.  2 stool softeners a day 4.  Milk of magnesia or magnesium tablets if needed  Follow-up 4 weeks  Meredith Staggers, MD, DFAPA     Please see After Visit Summary for patient specific instructions.  Future Appointments  Date Time Provider Department Center  08/19/2021 11:00 AM [facility]  09/04/2021  2:00 PM Cottle, Steva Ready., MD [facility] None  09/25/2021 11:00 AM Hilarie Fredrickson, MD [facility]  11/11/2021 11:30 AM Penumalli, Glenford Bayley, MD [facility] None    No orders of the defined types were placed in this encounter.     -------------------------------

## 2021-08-05 ENCOUNTER — Other Ambulatory Visit: Payer: Self-pay | Admitting: Psychiatry

## 2021-08-05 DIAGNOSIS — F4010 Social phobia, unspecified: Secondary | ICD-10-CM

## 2021-08-05 DIAGNOSIS — F4001 Agoraphobia with panic disorder: Secondary | ICD-10-CM

## 2021-08-05 DIAGNOSIS — F411 Generalized anxiety disorder: Secondary | ICD-10-CM

## 2021-08-16 ENCOUNTER — Other Ambulatory Visit: Payer: Self-pay | Admitting: Psychiatry

## 2021-08-16 DIAGNOSIS — F411 Generalized anxiety disorder: Secondary | ICD-10-CM

## 2021-08-16 DIAGNOSIS — F4310 Post-traumatic stress disorder, unspecified: Secondary | ICD-10-CM

## 2021-08-16 DIAGNOSIS — F4001 Agoraphobia with panic disorder: Secondary | ICD-10-CM

## 2021-08-16 DIAGNOSIS — F4010 Social phobia, unspecified: Secondary | ICD-10-CM

## 2021-08-16 DIAGNOSIS — F332 Major depressive disorder, recurrent severe without psychotic features: Secondary | ICD-10-CM

## 2021-08-19 ENCOUNTER — Other Ambulatory Visit: Payer: Self-pay

## 2021-08-19 ENCOUNTER — Ambulatory Visit (AMBULATORY_SURGERY_CENTER): Payer: 59

## 2021-08-19 VITALS — Ht 72.0 in | Wt 235.0 lb

## 2021-08-19 DIAGNOSIS — Z1211 Encounter for screening for malignant neoplasm of colon: Secondary | ICD-10-CM

## 2021-08-19 MED ORDER — NA SULFATE-K SULFATE-MG SULF 17.5-3.13-1.6 GM/177ML PO SOLN: 1.0000 | Freq: Once | ORAL | 0 refills | Status: AC

## 2021-08-19 NOTE — Progress Notes (Signed)

## 2021-08-29 ENCOUNTER — Telehealth: Payer: Self-pay | Admitting: Internal Medicine

## 2021-08-29 NOTE — Telephone Encounter (Signed)
Returned call to patient. He wanted to know if his colonoscopy would need to be authorized as preventative so that he could get full coverage. Pt does not have a history of polyps. I told pt that more than likely he will be considered a screening colonoscopy, he knows that if they find polyps during his procedure it will be considered a diagnostic exam. Pt knows that the pre-certification coordinators will begin working on his procedure authorization closer to the the time of his appt. He knows that he will get a letter via my chart with more information about his coverage. Pt states that he had to pay $85 for his prep which he is OK with but he said if it was coded as preventative it would have been free. That is why he is inquiring about colonoscopy coverage. He verbalized understanding about the information that I provided him and had no concerns at the end of the call.

## 2021-08-29 NOTE — Telephone Encounter (Signed)
Patient wants to know if he has to have a pre authorization. Seeking advice, please advise.

## 2021-09-03 ENCOUNTER — Other Ambulatory Visit: Payer: Self-pay | Admitting: Psychiatry

## 2021-09-03 DIAGNOSIS — F411 Generalized anxiety disorder: Secondary | ICD-10-CM

## 2021-09-03 DIAGNOSIS — F4001 Agoraphobia with panic disorder: Secondary | ICD-10-CM

## 2021-09-03 DIAGNOSIS — F4010 Social phobia, unspecified: Secondary | ICD-10-CM

## 2021-09-03 DIAGNOSIS — F4310 Post-traumatic stress disorder, unspecified: Secondary | ICD-10-CM

## 2021-09-04 ENCOUNTER — Encounter: Payer: Self-pay | Admitting: Psychiatry

## 2021-09-04 ENCOUNTER — Ambulatory Visit (INDEPENDENT_AMBULATORY_CARE_PROVIDER_SITE_OTHER): Payer: 59 | Admitting: Psychiatry

## 2021-09-04 ENCOUNTER — Other Ambulatory Visit: Payer: Self-pay

## 2021-09-04 DIAGNOSIS — F1021 Alcohol dependence, in remission: Secondary | ICD-10-CM

## 2021-09-04 DIAGNOSIS — F338 Other recurrent depressive disorders: Secondary | ICD-10-CM

## 2021-09-04 DIAGNOSIS — F4001 Agoraphobia with panic disorder: Secondary | ICD-10-CM

## 2021-09-04 DIAGNOSIS — F332 Major depressive disorder, recurrent severe without psychotic features: Secondary | ICD-10-CM | POA: Diagnosis not present

## 2021-09-04 DIAGNOSIS — F4010 Social phobia, unspecified: Secondary | ICD-10-CM | POA: Diagnosis not present

## 2021-09-04 DIAGNOSIS — F4310 Post-traumatic stress disorder, unspecified: Secondary | ICD-10-CM

## 2021-09-04 DIAGNOSIS — F411 Generalized anxiety disorder: Secondary | ICD-10-CM | POA: Diagnosis not present

## 2021-09-04 NOTE — Patient Instructions (Addendum)
Try to avoid hydroxyzine  Reduce propranolol to 1/2 tablet twice daily for a week or 2 then stop it.

## 2021-09-04 NOTE — Progress Notes (Signed)
Adam Harvey [id number] [birth year] 60 y.o.    Subjective:   Patient ID:  Adam Harvey is a 61 y.o. (DOB [birth year]) male.  Chief Complaint:  Chief Complaint  Patient presents with   Follow-up   Anxiety   Depression    Anxiety Symptoms include dizziness and nervous/anxious behavior. Patient reports no chest pain, confusion, decreased concentration, nausea, palpitations or suicidal ideas.    Medication Refill Pertinent negatives include no arthralgias, chest pain, fatigue, nausea or weakness.  Depression        Associated symptoms include no decreased concentration, no fatigue and no suicidal ideas.  Past medical history includes anxiety.   Adam Harvey presents to the office today for follow-up of several diagnoses and wanted an earlier appointment because he wanted to restart duloxetine.  visit January 16, 2019.  He had wanted a trial off of antidepressants in part because of reading on the Internet about withdrawal symptoms.  He had been off antidepressants for 2 months.  And felt more anxious and depressed.  We discussed it and decided to return to duloxetine and increase to 60 mg daily.  visit in July after being back on the medication of duloxetine 60 mg daily he reported the following: It's great.  No depression but some anxiety that is managed.  He's 9/10 and satisfied.  Pleased with the dosage..  Tolerated well.  Sleep is good.  Better self care and better eating.  Working really hard but business is good.  In Holiday representative.  Location manager.  Energy better. Good appetite.  For years has had thoughts of if got terminal disease it would be easier and they are worse.  Hopelessness resolved. He had wanted to consider tapering off gabapentin and was given permission to do that if he chose to do so. He was continued on lamotrigine 150 mg daily.  visit October 16 2019.   Don't feel depressed and kind of happy but lingering thoughts of "is this as good as it gets?".  Not consuming him.  Not  much excitement about things except work.  Doing design work for Teacher, early years/pre.  Some anxiety about being around people. Not DT Covid unless it made it worse.  Prefer to be alone.  Did a lot of theater in the past but not excited like he used to be.  Likes yard work.  Not interested in dating.  Wants to talk about Gabapentin for pain.  Previously thought it helped.  Wants to try to stop it and see if he can do without it.  Knees don't hurt bc replaced. Plan:  Cont duloxetine 60 and add Abilify 5 Taper gabapentin off  12/25/19 appt, the following is noted: Anxiety not good.  Weary of dealing with it.  Had trouble dealing with number of people on the beach this weekend DT anxiety.  At times would like a BZ.  Always felt anxious but not managing it well.  Isolating. No SE gabapentin.  Did not stop it bc he forgot it. Plan: Increase gabapentin to 300 BID and 600 HS for 2 days, then incrase to 2 in AM  And HS and 1 in middle of the day for 4 days, then increase to 600 TID.  For a week, if NR then increase to 900  TID.    02/08/2020 appointment with the following noted: Increased gabapentin to 600 mg BID.  Has to nap if takes more.  No less anxious with the increase in dose.  No benefit to it. Asks about BZ.  Doesn't really want to leave the house and anxiety driving.  Can get anxious even in yard bc afraid of having to talk to people.  No paranoia. Dread of things.  Emotional eating. Plan: Switch to paroxetine 30 mg for better antianxiety effect and hopefully help depression a little more.  Start one half of paroxetine 20 mg tablet and reduce duloxetine to 2 of the 20 mg capsules for 5 days, Then increase paroxetine to 1 tablet daily and reduce duloxetine to 1 capsule daily for 5 days, Then increase to 1-1/2 paroxetine tablets daily and stop duloxetine Continue Abilify 5 mg augmentation for residual anhedonia.   03/08/2020 phone call patient stating he felt Paxil was causing tremors in hands and feet  and not helping with anxiety.  He was instructed to reduce paroxetine to 20 mg daily and stop Abilify for 3 days and then restart Abilify 1/2 tablet. 03/20/2020 phone call patient stating the above changes made no difference in tremor.  He was instructed to stop Abilify and increase paroxetine to 40 mg daily. 04/01/2020 patient called stating his PCP wanted him to stop taking paroxetine completely to see if the tremors would resolve. 05/01/2020 patient phone call stating he was diagnosed with Parkinson's disease but because of anxiety needed to be on some medication he was encouraged to restart the paroxetine which had never had an adequate trial for his anxiety. 06/03/2020 phone call from patient complaining of panic attacks.  He had increase paroxetine to 40 mg a day and was given hydroxyzine as needed.  06/07/2020 urgent appointment with the following noted: Not well.  Depression as bad as ever and anxiety through the roof.  Worse beginning of the week bc feels completely overwhelming.   Getting OOB, brushing teeth is hard, being in public hard. Social avoidance affecting work.  Don't feel scared of PD but slap in the face about age.  Single and in middle of 5 year bankruptcy.  Trouble with concentration. Not suicidal but death thoughts. Yesterday a good day and laughed first time in a while. Poor appetite and lost weight. On paroxetine 40 mg for 3 weeks. No SE.   Trouble staying asleep. 4-5  Hours. Plan: Needs more time to respond to paroxetine 40 mg daily Deplin 15 mg daily for depression  With samples. Increase hydroxyzine for ST anxiety.  07/10/2020 appointment with the following noted: Depression is better but anxiety is through the roof.  Can laugh again.  Less hopeless and paralyzed by depression.  Now 5/10 depression.  Anxiety is still 10/10.  Will have nausea going out.  Has managed the anxiety pretty well.  Wonders if PD is causing anxiety.  Over the last 2-3 years more social anxiety.   No  SE with Paxil. Hydroxyzine TID and 2 HS.  Lorazepam helps but manages it. Plan: Increase paroxetine 60 mg daily.  It helped depression but not anxiety so far.  08/06/20 appt with following noted: Cant tell a difference.  Anxiety still up.  BP up with stress.  Takes Ativan prn. Started hydroxyzine 75 HS and sleeping better.  If EMA then hard to get back to sleep.  Not anxious at night typically.  Around people tense and heart races. I don't feel as depressed but no decorations for Xmas for the first time.  Typically after Jan 1 falls into depression and a bit anxious over it. Plan: Continue paroxetine 60 mg daily at least another month.  It helped depression but not anxiety so far. Increase propranolol  to 60-80 mg TID for anxiety  09/05/2020 appointment with following noted: Still awakens terrified of the world and hard to get OOB. Depression is still a bit of a problem. Going anywhere to deal with anyone makes him very anxious to the point of nausea.  When takes lorazepam it eases it off rather than eliminating it. Lorazepam 1 mg avg daily.  Really bad Xmas.  Even to the point of scared to go out with kids.  Once out he does ok usually. SE a little tired.  Not severe.   Hydroxyzine during day and 75 mg at night for sleep and it helps. No libido. Dx PD so can't use risperidone Plan: Increase paroxetine 80 mg daily at least another month.  It helped depression but not anxiety so far.  10/10/2020 appointment with the following noted: Anxiety no better.  Hopeless and "waiting to die".  No joy or relaxation around the corner. Lorazepam helps otherwise shaking and sweating.  Cancelled appts yesterday.  Likes there's something inside the body that's twisting. Plan no med changes.  We will give paroxetine 80 mg another month or so to help.  11/06/2020 appointment with the following noted: I still have pit in stomach but anxiety is better with less shaking sweating, SOB.  Still don't want to go out  much.  Still a lot of worry.  Once home in PM then can let go easier.  Goes to bed at 9 and sleeps good.  Normal function. No change in hopelessness.  No joy or looking forward.  I feel sad and lonely a good part of the time.  Enjoyed football game. Neuro yesterday doing well with early stage PD. Plan: Reduce paroxetine by 50% every 2 weeks down to 20 mg and stay at 20 mg. Start Viibryd 10 mg daily with 350 calories for 1 week then increase to 20 mg daily  12/10/20 appt noted:  Problem with cost Viibryd. Continued with Viibryd 20 mg daily and down to paroxetine 20 mg daily without withdrawal.  The terror of the outside world continues.  Still anxious around most other people and going out. For the first time in a long while,  over the last week, he felt joy and connection and he even made other people laugh.  Had less anxiety around them than usual.  Less hopeless.  Managing work better. Found copay card and last RX $45.   No SE and no withdrawal.  Plan:  paroxetine  stay at 20 mg until the increase in Viibryd can have benefit Increase Viibryd with 350 calories to 40 mg daily DC Deplin due to no response   01/07/2021 appointment with the following noted: The fear of the world remains but significantly less hopeless.  Very anxious over bankruptcy hearing today.  18 mos left on dealing with that. Son graduated from law-school recently.  He got 2 awards.  Left after he walked across the stage.  Couldn't stay in the crowd later.  Ongoing panic with agoraphobia.   Less death thoughts but still some. Works from house and stays there.   Tolerated Viibryd well.   NO SE Needs Klonopin and hydroxyzine. Plan: paroxetine  stay at 20 mg until the increase in Viibryd can have benefit continue Viibryd with 350 calories to 40 mg daily  02/06/21 appt with following noted: Viibryd 40 and paxil 20. Stopped lithium after 3 weeks DT diarrhea and NR. Managing but not great.  Not dramatic improvement but  thinks overall it is better than  with Paxil.  Less depressed than 6 mos ago.  But anxiety over normal activities even in grocery store or waiting room. Working hard.  Managing meetings.   Plan: Reduce Paxil to 10 mg for 2 weeks and stop it Increase Viibryd to 60 mg daily for better anxiety effects  04/03/2021 appointment with following noted: Couldn't afford Viibryd at $200/mo so dropped down to 40 mg daily. More crying on Viibryd even sobbing if not at work.  Feels very alone and hopeless.  Recurring dream disturbing with homesick feelings. Death thoughts without sui intent or plan.  Sleep good. Can do ok around people.  Able to respond to respond to positive stimuli. Plan:  Reduce Viibryd to 1/2 tablet 20 mg and start nortriptyline 25 mg 1 at night for 5 nights then, Continue Viibryd 20 mg daily and increase nortriptyline 2 of the 25 mg capsules for 5 nights, then Stop Viibryd and increase nortriptyline to 3 of the 25 mg capsules nightly. Wait 1 week and get the blood test in the morning. Disc SE  05/19/2021 appointment with the following noted: Cautiously optimistic with change to nortriptyline.  No longer homesick feeling, more confident and less crying.  Still doesn't like going anywhere.  Trying to reduce clonazepam.  Last Saturday 60th BD and really down bc kids didn't come to surprise him..  Lonely. PE last week was good. Handling going out easier.   Been on nortriptyline 75 mg for a month. No full panic lately.  Still leaving house and driving causes anxiety. No SE except 2-3 nights of indigestion. Sleep is good. Plan: Lithium augmentation option to try lower dose 150 bc diarrhea with 300 mg. Yes.  06/18/21 appt noted: Mood depression less severe without crying.  Anxiety still a problem.  Not happy or sad.  Nervous driving. Worrying PD may be worse. Clonazepam 1 mg does not cause sleepiness, dizziness. No particular effect of lihtium 150. Appetite reduced.   Propranolol keeps  BP down. Plan clonidine trial for anxiety  07/30/2021 appt noted: Sees benefit with clonidine 0.1 mg BID.  More at ease.  Still can worry.  Takes clonazepam prn.  Average 1.5 mg daily.  I feel better.  Less hopeless and enjoys things more. Often depression in January. SE constipation and dry mouth. Plan: Reduce propranolol to 40 mg BID bc borderline low BP Continue clonidine 0.1 mg twice daily which was helpful Continue lithium 150 nightly Continue lamotrigine 150 mg daily Continue nortriptyline 75 mg nightly Reduce propranolol to 40 mg twice daily due to 2 borderline low blood pressure Continue Klonopin 1 mg twice daily Try to reduce hydroxyzine to 25 mg 3 times daily and 25-50 nightly Continue gabapentin Discussed referral for TMS  09/04/2021 appointment with the following noted: Covid 10 days ago first time.  Bad first 3 days. Reduced propranolol and lisinopril and now 1/2 tablet lisinopril. Feeling better despite Jan always terrible month for me.   Less fearful going  places but would prefer to be at home.    Doing AA by internet.  Sober since 2006.  No history of BZ dependence. Asked about BZ for severe anxiety.  Past Psychiatric Medication Trials:  Brief duloxetine, sertraline 200, paroxetine 80, Viibryd 40, nortriptyline 75 buspirone dizzy, gabapentin,  Lorazepam , clonazepam is better Hydroxyzine  Propranolol 40 BID Lithium 300 diarrhea Abilify helped the depression and sense of dread but nothing for anxiety. Dx PD so can't use risperidone  Review of Systems:   Review of Systems  Constitutional:  Positive for unexpected weight change. Negative for fatigue.  Cardiovascular:  Negative for chest pain and palpitations.  Gastrointestinal:  Positive for constipation. Negative for diarrhea and nausea.  Musculoskeletal:  Negative for arthralgias.  Neurological:  Positive for dizziness and tremors. Negative for weakness.  Psychiatric/Behavioral:  Positive for dysphoric mood.  Negative for agitation, behavioral problems, confusion, decreased concentration, hallucinations, self-injury, sleep disturbance and suicidal ideas. The patient is nervous/anxious. The patient is not hyperactive.    Medications: I have reviewed the patient's current medications.  Current Outpatient Medications  Medication Sig Dispense Refill   aspirin EC 81 MG tablet Take 1 tablet (81 mg total) by mouth 2 (two) times daily. (Patient taking differently: Take 81 mg by mouth daily.) 60 tablet 0   carbidopa-levodopa (SINEMET IR) 25-100 MG tablet Take 2 tablets by mouth 3 (three) times daily before meals. 270 tablet 4   clonazePAM (KLONOPIN) 1 MG tablet TAKE 1 TABLET(1 MG) BY MOUTH TWICE DAILY 60 tablet 0   cloNIDine (CATAPRES) 0.1 MG tablet Take 1 tablet (0.1 mg total) by mouth 2 (two) times daily. 60 tablet 0   hydrOXYzine (ATARAX) 25 MG tablet TAKE 1 TABLET BY MOUTH THREE TIMES DAILY AND AN ADDITIONAL 1 TO 2 TABLETS AT BEDTIME AS NEEDED FOR ANXIETY OR INSOMNIA 150 tablet 0   lamoTRIgine (LAMICTAL) 150 MG tablet TAKE 1 TABLET(150 MG) BY MOUTH AT BEDTIME 30 tablet 0   lisinopril-hydrochlorothiazide (PRINZIDE,ZESTORETIC) 20-12.5 MG per tablet Take 1 tablet by mouth 2 (two) times daily. 1/2 tab daily     lithium carbonate 150 MG capsule TAKE 1 CAPSULE(150 MG) BY MOUTH DAILY 30 capsule 0   meloxicam (MOBIC) 15 MG tablet Take 15 mg by mouth daily.     methocarbamol (ROBAXIN) 500 MG tablet Take 500 mg by mouth 3 (three) times daily.     Multiple Vitamins-Minerals (MULTIVITAMIN WITH MINERALS) tablet Take 1 tablet by mouth daily.     nortriptyline (PAMELOR) 25 MG capsule TAKE 3 CAPSULES(75 MG) BY MOUTH AT BEDTIME 90 capsule 0   omeprazole (PRILOSEC) 20 MG capsule Take 20 mg by mouth 2 (two) times daily.     propranolol (INDERAL) 40 MG tablet Take 2 tablets (80 mg total) by mouth 2 (two) times daily. (Patient taking differently: Take 40 mg by mouth 2 (two) times daily.) 360 tablet 0   simvastatin (ZOCOR) 40  MG tablet Take 40 mg by mouth daily.     No current facility-administered medications for this visit.    Medication Side Effects: None  Allergies: No Known Allergies  Past Medical History:  Diagnosis Date   Alcoholism (HCC)    sober since 01-2011    Anxiety    Arthritis    Balance problem    Complication of anesthesia    unsuccessful spinal with right knee replacement, used general anesthesia   Depression    Difficulty concentrating    Diverticulosis 2012   GERD (gastroesophageal reflux disease)    Hemorrhoids    Hyperlipidemia    Hypertension    Internal hemorrhoids 2012   Neuromuscular disorder (HCC)    Parkinson   Obsessive compulsive disorder    Osteoarthritis    Rotator cuff tear, left    Tear of right rotator cuff 08/03/2014   Wears contact lenses     Family History  Problem Relation Age of Onset   Stroke Mother    Diabetes Mother    Cancer Father    Parkinson's disease Maternal Grandmother    Colon cancer Neg Hx  Colon polyps Neg Hx    Esophageal cancer Neg Hx    Stomach cancer Neg Hx    Rectal cancer Neg Hx     Social History   Socioeconomic History   Marital status: Divorced    Spouse name: Not on file   Number of children: 3   Years of education: Not on file   Highest education level: Bachelor's degree (e.g., BA, AB, BS)  Occupational History   Occupation: Tree surgeonrogram Director  Tobacco Use   Smoking status: Former    Types: E-cigarettes    Quit date: 08/01/2009    Years since quitting: 12.1   Smokeless tobacco: Never  Vaping Use   Vaping Use: Every day   Start date: 08/12/2014  Substance and Sexual Activity   Alcohol use: No    Comment: not since 2006   Drug use: No    Comment: qut 1996   Sexual activity: Not on file    Comment: uses e-cig  Other Topics Concern   Not on file  Social History Narrative   Lives alone   No caffeine   Social Determinants of Health   Financial Resource Strain: Not on file  Food Insecurity: Not on  file  Transportation Needs: Not on file  Physical Activity: Not on file  Stress: Not on file  Social Connections: Not on file  Intimate Partner Violence: Not on file    Past Medical History, Surgical history, Social history, and Family history were reviewed and updated as appropriate.   Please see review of systems for further details on the patient's review from today.   Objective:   Physical Exam:  There were no vitals taken for this visit.  Physical Exam Constitutional:      General: He is not in acute distress. Musculoskeletal:        General: No deformity.  Neurological:     Mental Status: He is alert and oriented to person, place, and time.     Cranial Nerves: No dysarthria.     Coordination: Coordination normal.  Psychiatric:        Attention and Perception: Attention and perception normal. He does not perceive auditory or visual hallucinations.        Mood and Affect: Mood is depressed. Mood is not anxious. Affect is not labile, blunt, angry or inappropriate.        Speech: Speech normal.        Behavior: Behavior normal. Behavior is cooperative.        Thought Content: Thought content normal. Thought content is not paranoid or delusional. Thought content does not include homicidal or suicidal ideation. Thought content does not include suicidal plan.        Cognition and Memory: Cognition and memory normal.        Judgment: Judgment normal.     Comments: Talkative. severe anxiety much better and depression is improved.    Lab Review:     Component Value Date/Time   NA 136 08/02/2018 0458   K 5.0 08/02/2018 0458   CL 102 08/02/2018 0458   CO2 27 08/02/2018 0458   GLUCOSE 138 (H) 08/02/2018 0458   BUN 26 (H) 08/02/2018 0458   CREATININE 1.10 08/02/2018 0458   CALCIUM 9.0 08/02/2018 0458   PROT 7.6 08/12/2017 1445   ALBUMIN 4.3 08/12/2017 1445   AST 29 08/12/2017 1445   ALT 26 08/12/2017 1445   ALKPHOS 60 08/12/2017 1445   BILITOT 1.0 08/12/2017 1445    GFRNONAA >60 08/02/2018 0458  GFRAA >60 08/02/2018 0458       Component Value Date/Time   WBC 14.6 (H) 08/02/2018 0458   RBC 4.06 (L) 08/02/2018 0458   HGB 12.9 (L) 08/02/2018 0458   HCT 37.8 (L) 08/02/2018 0458   PLT 205 08/02/2018 0458   MCV 93.1 08/02/2018 0458   MCH 31.8 08/02/2018 0458   MCHC 34.1 08/02/2018 0458   RDW 12.9 08/02/2018 0458   LYMPHSABS 1.4 07/26/2018 0954   MONOABS 0.6 07/26/2018 0954   EOSABS 0.2 07/26/2018 0954   BASOSABS 0.0 07/26/2018 0954    No results found for: POCLITH, LITHIUM   No results found for: PHENYTOIN, PHENOBARB, VALPROATE, CBMZ   04/23/2021 nortriptyline level 113.  No med change indicated.  On nortriptyline 75 mg daily  .res Assessment: Plan:    Adam Harvey was seen today for follow-up, anxiety and depression.  Diagnoses and all orders for this visit:  GAD (generalized anxiety disorder)  Panic disorder with agoraphobia  Social anxiety disorder  Moderately severe recurrent major depression (HCC)  PTSD (post-traumatic stress disorder)  Alcohol dependence, in remission (HCC)  Seasonal depression (HCC)   Greater than 50% of 30 min face to face time with patient was spent on counseling and coordination of care. We discussed Patient stated he has been on antidepressants very long time and wondered what his emotional state would be like off the medication.  This was partly influenced by online patient for him to described problems with withdrawal coming off of SSRIs.  He successfully came off antidepressant without withdrawal symptoms.  But then has had relapse of both depression and anxiety within a few weeks off the antidepressant.  SX have been severe and persistent but is seeing  improvement from nortriptyline which is significant for depression but not much forr anxiety.  Depression is much better than usual this time of year.  Anxiety is better markedly with clonidine. Limited options if trying to avoid atypicals DT PD.  Cant'  increase bc blood pressure Clonidine  0.1 mg BID was helpful.  Consider TMS, Spravato, ECT, TCA, MAOI, etc.45 min discussion details of each.  Consider lithium, mirtazapine, Remeron, trazodone, Deplin .  Continue Lithium augmentation 150 bc diarrhea with 300 mg. Yes.  Transcranial Magnetic Stimulation TMS- [facility], [place] Other Option is Spravato ( nasal spray ketamine)  Switch to nortriptyline 75 HS led to improvement. 04/23/2021 nortriptyline level 113.  No med change indicated.  On nortriptyline 75 mg daily Disc SE  Consider further reduction propranolol to 40 mg BID bc borderline low BP  Klonopin 1 mg BID  Disc risk triggering alcohol abuse.  He wants it bc feels desperate.  Disc fall risk.  We discussed the short-term risks associated with benzodiazepines including sedation and increased fall risk among others.  Discussed long-term side effect risk including dependence, potential withdrawal symptoms, and the potential eventual dose-related risk of dementia.  But recent studies from 2020 dispute this association between benzodiazepines and dementia risk. Newer studies in 2020 do not support an association with dementia. He doesn't notice SE He needs this mainly to leave the house.  reduce hydroxyzine  to just prn 25 mg prn anxiety and it helps And 1-2 hs prn insomnia. Continue gabapentin.    Discussed his chronic thoughts about death. They resolved so far.  He commits to safety and is not having suicidal thoughts.  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   Disc BZ.   Extensive discussion of how Covid has allowed social anxiety to get worse  but is functional.   Genesight DT TR sx.  Reviewed results at length.     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  Continue AA.    Rec continue counseling for therapy.  Disc vitamin D in detail.  Vitamin D 5000 units daily until April 1  Constipation management 1.  Lots of water 2.  Powdered fiber supplement  such as MiraLAX, Citrucel, etc. preferably with a meal 3.  2 stool softeners a day 4.  Milk of magnesia or magnesium tablets if needed  Disc concerns about polypharmacy which seems unavoidable at present Plan: Plan: Reduce propranolol to 20 mg BID bc borderline low BP and if stable stop it. Continue clonidine 0.1 mg twice daily which was helpful Continue lithium 150 nightly Continue lamotrigine 150 mg daily Continue nortriptyline 75 mg nightly Reduce propranolol to 40 mg twice daily due to 2 borderline low blood pressure Continue Klonopin 1 mg twice daily Try to rstop hydroxyzine to 25 mg 3 times daily and 25-50 nightly Continue gabapentin Discussed referral for TMS  Follow-up 4 weeks  Meredith Staggers, MD, DFAPA     Please see After Visit Summary for patient specific instructions.  Future Appointments  Date Time Provider Department Center  09/25/2021 11:00 AM Hilarie Fredrickson, MD [facility]  10/06/2021  2:00 PM Cottle, Steva Ready., MD [facility] None  11/11/2021 11:30 AM Penumalli, Glenford Bayley, MD [facility] None    No orders of the defined types were placed in this encounter.      -------------------------------

## 2021-09-08 ENCOUNTER — Encounter: Payer: 59 | Admitting: Internal Medicine

## 2021-09-21 ENCOUNTER — Other Ambulatory Visit: Payer: Self-pay | Admitting: Psychiatry

## 2021-09-21 DIAGNOSIS — F332 Major depressive disorder, recurrent severe without psychotic features: Secondary | ICD-10-CM

## 2021-09-22 ENCOUNTER — Encounter: Payer: Self-pay | Admitting: Internal Medicine

## 2021-09-23 ENCOUNTER — Other Ambulatory Visit: Payer: Self-pay | Admitting: Psychiatry

## 2021-09-23 DIAGNOSIS — F411 Generalized anxiety disorder: Secondary | ICD-10-CM

## 2021-09-23 DIAGNOSIS — F332 Major depressive disorder, recurrent severe without psychotic features: Secondary | ICD-10-CM

## 2021-09-23 DIAGNOSIS — F4001 Agoraphobia with panic disorder: Secondary | ICD-10-CM

## 2021-09-23 DIAGNOSIS — F4010 Social phobia, unspecified: Secondary | ICD-10-CM

## 2021-09-23 DIAGNOSIS — F4310 Post-traumatic stress disorder, unspecified: Secondary | ICD-10-CM

## 2021-09-25 ENCOUNTER — Ambulatory Visit (AMBULATORY_SURGERY_CENTER): Payer: 59 | Admitting: Internal Medicine

## 2021-09-25 ENCOUNTER — Encounter: Payer: Self-pay | Admitting: Internal Medicine

## 2021-09-25 VITALS — BP 102/62 | HR 58 | Temp 98.9°F | Resp 16 | Ht 72.0 in | Wt 235.0 lb

## 2021-09-25 DIAGNOSIS — Z1211 Encounter for screening for malignant neoplasm of colon: Secondary | ICD-10-CM

## 2021-09-25 MED ORDER — SODIUM CHLORIDE 0.9 % IV SOLN: 500.0000 mL | Freq: Once | INTRAVENOUS | Status: DC

## 2021-09-25 NOTE — Progress Notes (Signed)
HISTORY OF PRESENT ILLNESS:  Adam Harvey is a 61 y.o. male who presents today for screening colonoscopy.  Negative index exam 2012.  No GI complaints.  Tolerated prep.  REVIEW OF SYSTEMS:  All non-GI ROS negative. Past Medical History:  Diagnosis Date   Alcoholism ([place])    sober since 01-2011    Anxiety    Arthritis    Balance problem    Complication of anesthesia    unsuccessful spinal with right knee replacement, used general anesthesia   Depression    Difficulty concentrating    Diverticulosis 2012   GERD (gastroesophageal reflux disease)    Hemorrhoids    Hyperlipidemia    Hypertension    Internal hemorrhoids 2012   Neuromuscular disorder ([place])    Parkinson   Obsessive compulsive disorder    Osteoarthritis    Rotator cuff tear, left    Tear of right rotator cuff 08/03/2014   Wears contact lenses     Past Surgical History:  Procedure Laterality Date   ABCESS DRAINAGE  2012   nasal cyst   ACHILLES TENDON REPAIR  2008   right   ARTHOSCOPIC ROTAOR CUFF REPAIR Right 08/03/2014   Procedure: ARTHROSCOPIC ROTATOR CUFF REPAIR;  Surgeon: Johnny Bridge, MD;  Location: [facility];  Service: Orthopedics;  Laterality: Right;   COLONOSCOPY  2012   INGUINAL HERNIA REPAIR     right as infant   KNEE ARTHROSCOPY Left    x4   KNEE SURGERY     x3-right   ROTATOR CUFF REPAIR Left 2011    x 2   TONSILLECTOMY     TOTAL KNEE ARTHROPLASTY Right 12/2017   debride scar tissue   TOTAL KNEE ARTHROPLASTY Right 08/20/2017   Procedure: TOTAL KNEE ARTHROPLASTY;  Surgeon: Earlie Server, MD;  Location: [facility];  Service: Orthopedics;  Laterality: Right;   TOTAL KNEE ARTHROPLASTY Left 08/01/2018   Procedure: TOTAL KNEE ARTHROPLASTY;  Surgeon: Frederik Pear, MD;  Location: [facility];  Service: Orthopedics;  Laterality: Left;  Adductor Block   TYMPANOSTOMY TUBE PLACEMENT     as child, several times    Social History Adam Harvey  reports that he quit smoking about 12 years  ago. His smoking use included e-cigarettes. He has never used smokeless tobacco. He reports that he does not drink alcohol and does not use drugs.  family history includes Cancer in his father; Diabetes in his mother; Parkinson's disease in his maternal grandmother; Stroke in his mother.  No Known Allergies     PHYSICAL EXAMINATION:  Vital signs: BP 126/78    Pulse 66    Temp 98.9 F (37.2 C)    Resp (!) 21    Ht 6' (1.829 m)    Wt 235 lb (106.6 kg)    SpO2 100%    BMI 31.87 kg/m  General: Well-developed, well-nourished, no acute distress HEENT: Sclerae are anicteric, conjunctiva pink. Oral mucosa intact Lungs: Clear Heart: Regular Abdomen: soft, nontender, nondistended, no obvious ascites, no peritoneal signs, normal bowel sounds. No organomegaly. Extremities: No edema Psychiatric: alert and oriented x3. Cooperative     ASSESSMENT:   Colon cancer screening  PLAN:   Screening colonoscopy

## 2021-09-25 NOTE — Progress Notes (Signed)
To Pacu, VSS. Report to [email or web address] 

## 2021-09-25 NOTE — Patient Instructions (Signed)
Resume previous medications.  Handouts on findings given to patient (Diverticulosis, Hemorrhoids)  YOU HAD AN ENDOSCOPIC PROCEDURE TODAY AT [facility]:   Refer to the procedure report that was given to you for any specific questions about what was found during the examination.  If the procedure report does not answer your questions, please call your gastroenterologist to clarify.  If you requested that your care partner not be given the details of your procedure findings, then the procedure report has been included in a sealed envelope for you to review at your convenience later.  YOU SHOULD EXPECT: Some feelings of bloating in the abdomen. Passage of more gas than usual.  Walking can help get rid of the air that was put into your GI tract during the procedure and reduce the bloating. If you had a lower endoscopy (such as a colonoscopy or flexible sigmoidoscopy) you may notice spotting of blood in your stool or on the toilet paper. If you underwent a bowel prep for your procedure, you may not have a normal bowel movement for a few days.  Please Note:  You might notice some irritation and congestion in your nose or some drainage.  This is from the oxygen used during your procedure.  There is no need for concern and it should clear up in a day or so.  SYMPTOMS TO REPORT IMMEDIATELY:  Following lower endoscopy (colonoscopy or flexible sigmoidoscopy):  Excessive amounts of blood in the stool  Significant tenderness or worsening of abdominal pains  Swelling of the abdomen that is new, acute  Fever of 100F or higher   For urgent or emergent issues, a gastroenterologist can be reached at any hour by calling [phone]. Do not use MyChart messaging for urgent concerns.    DIET:  We do recommend a small meal at first, but then you may proceed to your regular diet.  Drink plenty of fluids but you should avoid alcoholic beverages for 24 hours.  ACTIVITY:  You should plan to take it  easy for the rest of today and you should NOT DRIVE or use heavy machinery until tomorrow (because of the sedation medicines used during the test).    FOLLOW UP: Our staff will call the number listed on your records 48-72 hours following your procedure to check on you and address any questions or concerns that you may have regarding the information given to you following your procedure. If we do not reach you, we will leave a message.  We will attempt to reach you two times.  During this call, we will ask if you have developed any symptoms of COVID 19. If you develop any symptoms (ie: fever, flu-like symptoms, shortness of breath, cough etc.) before then, please call [phone].  If you test positive for Covid 19 in the 2 weeks post procedure, please call and report this information to [place].    If any biopsies were taken you will be contacted by phone or by letter within the next 1-3 weeks.  Please call us at [phone] if you have not heard about the biopsies in 3 weeks.    SIGNATURES/CONFIDENTIALITY: You and/or your care partner have signed paperwork which will be entered into your electronic medical record.  These signatures attest to the fact that that the information above on your After Visit Summary has been reviewed and is understood.  Full responsibility of the confidentiality of this discharge information lies with you and/or your care-partner.

## 2021-09-25 NOTE — Op Note (Signed)
[name]: Adam Harvey Procedure Date: 09/25/2021 10:39 AM MRN: [id number] Endoscopist: Docia Chuck. Adam Harvey , MD Age: 61 Referring MD:  Date of Birth: 08/02/1961 Gender: Male Account #: [id number] Procedure:                Colonoscopy Indications:              Screening for colorectal malignant neoplasm.                            Negative index exam 2012 Medicines:                Monitored Anesthesia Care Procedure:                Pre-Anesthesia Assessment:                           - Prior to the procedure, a History and Physical                            was performed, and patient medications and                            allergies were reviewed. The patient's tolerance of                            previous anesthesia was also reviewed. The risks                            and benefits of the procedure and the sedation                            options and risks were discussed with the patient.                            All questions were answered, and informed consent                            was obtained. Prior Anticoagulants: The patient has                            taken no previous anticoagulant or antiplatelet                            agents. ASA Grade Assessment: II - A patient with                            mild systemic disease. After reviewing the risks                            and benefits, the patient was deemed in                            satisfactory condition to undergo the procedure.  After obtaining informed consent, the colonoscope                            was passed under direct vision. Throughout the                            procedure, the patient's blood pressure, pulse, and                            oxygen saturations were monitored continuously. The                            CF [id number] was introduced through the anus                            and advanced to the the cecum, identified  by                            appendiceal orifice and ileocecal valve. The                            ileocecal valve, appendiceal orifice, and rectum                            were photographed. The quality of the bowel                            preparation was excellent. The colonoscopy was                            performed without difficulty. The patient tolerated                            the procedure well. The bowel preparation used was                            SUPREP via split dose instruction. Scope In: 10:48:57 AM Scope Out: 11:02:03 AM Scope Withdrawal Time: 0 hours 10 minutes 45 seconds  Total Procedure Duration: 0 hours 13 minutes 6 seconds  Findings:                 Multiple small-mouthed diverticula were found in                            the sigmoid colon.                           Internal hemorrhoids were found during retroflexion.                           The exam was otherwise without abnormality on                            direct and retroflexion views. Complications:  No immediate complications. Estimated blood loss:                            None. Estimated Blood Loss:     Estimated blood loss: none. Impression:               - Diverticulosis in the sigmoid colon.                           - Internal hemorrhoids.                           - The examination was otherwise normal on direct                            and retroflexion views.                           - No specimens collected. Recommendation:           - Repeat colonoscopy in 10 years for screening                            purposes.                           - Patient has a contact number available for                            emergencies. The signs and symptoms of potential                            delayed complications were discussed with the                            patient. Return to normal activities tomorrow.                            Written discharge instructions  were provided to the                            patient.                           - Resume previous diet.                           - Continue present medications. Docia Chuck. Adam Pastor, MD 09/25/2021 11:08:51 AM This report has been signed electronically.

## 2021-09-26 ENCOUNTER — Other Ambulatory Visit: Payer: Self-pay | Admitting: Psychiatry

## 2021-09-26 DIAGNOSIS — F4010 Social phobia, unspecified: Secondary | ICD-10-CM

## 2021-09-26 DIAGNOSIS — F4001 Agoraphobia with panic disorder: Secondary | ICD-10-CM

## 2021-09-26 DIAGNOSIS — F4310 Post-traumatic stress disorder, unspecified: Secondary | ICD-10-CM

## 2021-09-29 ENCOUNTER — Telehealth: Payer: Self-pay

## 2021-09-29 ENCOUNTER — Telehealth: Payer: Self-pay | Admitting: *Deleted

## 2021-09-29 NOTE — Telephone Encounter (Signed)
Attempted f/u phone call. No answer. Left message. °

## 2021-09-29 NOTE — Telephone Encounter (Signed)
°  Follow up Call-  Call back number 09/25/2021  Post procedure Call Back phone  # [phone]  Permission to leave phone message Yes  Some recent data might be hidden   Second follow up call, LVM.

## 2021-10-01 ENCOUNTER — Other Ambulatory Visit: Payer: Self-pay | Admitting: Psychiatry

## 2021-10-01 DIAGNOSIS — F4001 Agoraphobia with panic disorder: Secondary | ICD-10-CM

## 2021-10-01 DIAGNOSIS — F411 Generalized anxiety disorder: Secondary | ICD-10-CM

## 2021-10-01 DIAGNOSIS — F4010 Social phobia, unspecified: Secondary | ICD-10-CM

## 2021-10-06 ENCOUNTER — Ambulatory Visit (INDEPENDENT_AMBULATORY_CARE_PROVIDER_SITE_OTHER): Payer: 59 | Admitting: Psychiatry

## 2021-10-06 ENCOUNTER — Other Ambulatory Visit: Payer: Self-pay

## 2021-10-06 ENCOUNTER — Encounter: Payer: Self-pay | Admitting: Psychiatry

## 2021-10-06 DIAGNOSIS — F338 Other recurrent depressive disorders: Secondary | ICD-10-CM

## 2021-10-06 DIAGNOSIS — F1021 Alcohol dependence, in remission: Secondary | ICD-10-CM

## 2021-10-06 DIAGNOSIS — F332 Major depressive disorder, recurrent severe without psychotic features: Secondary | ICD-10-CM

## 2021-10-06 DIAGNOSIS — F4310 Post-traumatic stress disorder, unspecified: Secondary | ICD-10-CM

## 2021-10-06 DIAGNOSIS — F4010 Social phobia, unspecified: Secondary | ICD-10-CM

## 2021-10-06 DIAGNOSIS — F4001 Agoraphobia with panic disorder: Secondary | ICD-10-CM

## 2021-10-06 DIAGNOSIS — F411 Generalized anxiety disorder: Secondary | ICD-10-CM | POA: Diagnosis not present

## 2021-10-06 NOTE — Patient Instructions (Signed)
Constipation management 1.  Lots of water 2.  Powdered fiber supplement such as MiraLAX, Citrucel, etc. preferably with a meal 3.  2 stool softeners a day 4.  Milk of magnesia or magnesium tablets if needed  

## 2021-10-06 NOTE — Progress Notes (Signed)
Linus Comito [id number] 04-Feb-1961 61 y.o.    Subjective:   Patient ID:  Adam Harvey is a 61 y.o. (DOB March 21, 1961) male.  Chief Complaint:  Chief Complaint  Patient presents with   Follow-up   Depression   Anxiety    Anxiety Symptoms include dizziness and nervous/anxious behavior. Patient reports no chest pain, confusion, decreased concentration, nausea, palpitations or suicidal ideas.    Medication Refill Pertinent negatives include no arthralgias, chest pain, fatigue, nausea or weakness.  Depression        Associated symptoms include no decreased concentration, no fatigue and no suicidal ideas.  Past medical history includes anxiety.   Adam Harvey presents to the office today for follow-up of several diagnoses and wanted an earlier appointment because he wanted to restart duloxetine.  visit January 16, 2019.  He had wanted a trial off of antidepressants in part because of reading on the Internet about withdrawal symptoms.  He had been off antidepressants for 2 months.  And felt more anxious and depressed.  We discussed it and decided to return to duloxetine and increase to 60 mg daily.  visit in July after being back on the medication of duloxetine 60 mg daily he reported the following: It's great.  No depression but some anxiety that is managed.  He's 9/10 and satisfied.  Pleased with the dosage..  Tolerated well.  Sleep is good.  Better self care and better eating.  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  Energy better. Good appetite.  For years has had thoughts of if got terminal disease it would be easier and they are worse.  Hopelessness resolved. He had wanted to consider tapering off gabapentin and was given permission to do that if he chose to do so. He was continued on lamotrigine 150 mg daily.  visit October 16 2019.   Don't feel depressed and kind of happy but lingering thoughts of "is this as good as it gets?".  Not consuming him.  Not  much excitement about things except work.  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  Some anxiety about being around people. Not DT Covid unless it made it worse.  Prefer to be alone.  Did a lot of theater in the past but not excited like he used to be.  Likes yard work.  Not interested in dating.  Wants to talk about Gabapentin for pain.  Previously thought it helped.  Wants to try to stop it and see if he can do without it.  Knees don't hurt bc replaced. Plan:  Cont duloxetine 60 and add Abilify 5 Taper gabapentin off  12/25/19 appt, the following is noted: Anxiety not good.  Weary of dealing with it.  Had trouble dealing with number of people on the beach this weekend DT anxiety.  At times would like a BZ.  Always felt anxious but not managing it well.  Isolating. No SE gabapentin.  Did not stop it bc he forgot it. Plan: Increase gabapentin to 300 BID and 600 HS for 2 days, then incrase to 2 in AM  And HS and 1 in middle of the day for 4 days, then increase to 600 TID.  For a week, if NR then increase to 900  TID.    02/08/2020 appointment with the following noted: Increased gabapentin to 600 mg BID.  Has to nap if takes more.  No less anxious with the increase in dose.  No benefit to it. Asks about BZ.  Doesn't really want to leave the house and anxiety driving.  Can get anxious even in yard bc afraid of having to talk to people.  No paranoia. Dread of things.  Emotional eating. Plan: Switch to paroxetine 30 mg for better antianxiety effect and hopefully help depression a little more.  Start one half of paroxetine 20 mg tablet and reduce duloxetine to 2 of the 20 mg capsules for 5 days, Then increase paroxetine to 1 tablet daily and reduce duloxetine to 1 capsule daily for 5 days, Then increase to 1-1/2 paroxetine tablets daily and stop duloxetine Continue Abilify 5 mg augmentation for residual anhedonia.   03/08/2020 phone call patient stating he felt Paxil was causing tremors in hands and feet  and not helping with anxiety.  He was instructed to reduce paroxetine to 20 mg daily and stop Abilify for 3 days and then restart Abilify 1/2 tablet. 03/20/2020 phone call patient stating the above changes made no difference in tremor.  He was instructed to stop Abilify and increase paroxetine to 40 mg daily. 04/01/2020 patient called stating his PCP wanted him to stop taking paroxetine completely to see if the tremors would resolve. 05/01/2020 patient phone call stating he was diagnosed with Parkinson's disease but because of anxiety needed to be on some medication he was encouraged to restart the paroxetine which had never had an adequate trial for his anxiety. 06/03/2020 phone call from patient complaining of panic attacks.  He had increase paroxetine to 40 mg a day and was given hydroxyzine as needed.  06/07/2020 urgent appointment with the following noted: Not well.  Depression as bad as ever and anxiety through the roof.  Worse beginning of the week bc feels completely overwhelming.   Getting OOB, brushing teeth is hard, being in public hard. Social avoidance affecting work.  Don't feel scared of PD but slap in the face about age.  Single and in middle of 5 year bankruptcy.  Trouble with concentration. Not suicidal but death thoughts. Yesterday a good day and laughed first time in a while. Poor appetite and lost weight. On paroxetine 40 mg for 3 weeks. No SE.   Trouble staying asleep. 4-5  Hours. Plan: Needs more time to respond to paroxetine 40 mg daily Deplin 15 mg daily for depression  With samples. Increase hydroxyzine for ST anxiety.  07/10/2020 appointment with the following noted: Depression is better but anxiety is through the roof.  Can laugh again.  Less hopeless and paralyzed by depression.  Now 5/10 depression.  Anxiety is still 10/10.  Will have nausea going out.  Has managed the anxiety pretty well.  Wonders if PD is causing anxiety.  Over the last 2-3 years more social anxiety.   No  SE with Paxil. Hydroxyzine TID and 2 HS.  Lorazepam helps but manages it. Plan: Increase paroxetine 60 mg daily.  It helped depression but not anxiety so far.  08/06/20 appt with following noted: Cant tell a difference.  Anxiety still up.  BP up with stress.  Takes Ativan prn. Started hydroxyzine 75 HS and sleeping better.  If EMA then hard to get back to sleep.  Not anxious at night typically.  Around people tense and heart races. I don't feel as depressed but no decorations for Xmas for the first time.  Typically after Jan 1 falls into depression and a bit anxious over it. Plan: Continue paroxetine 60 mg daily at least another month.  It helped depression but not anxiety so far. Increase propranolol  to 60-80 mg TID for anxiety  09/05/2020 appointment with following noted: Still awakens terrified of the world and hard to get OOB. Depression is still a bit of a problem. Going anywhere to deal with anyone makes him very anxious to the point of nausea.  When takes lorazepam it eases it off rather than eliminating it. Lorazepam 1 mg avg daily.  Really bad Xmas.  Even to the point of scared to go out with kids.  Once out he does ok usually. SE a little tired.  Not severe.   Hydroxyzine during day and 75 mg at night for sleep and it helps. No libido. Dx PD so can't use risperidone Plan: Increase paroxetine 80 mg daily at least another month.  It helped depression but not anxiety so far.  10/10/2020 appointment with the following noted: Anxiety no better.  Hopeless and "waiting to die".  No joy or relaxation around the corner. Lorazepam helps otherwise shaking and sweating.  Cancelled appts yesterday.  Likes there's something inside the body that's twisting. Plan no med changes.  We will give paroxetine 80 mg another month or so to help.  11/06/2020 appointment with the following noted: I still have pit in stomach but anxiety is better with less shaking sweating, SOB.  Still don't want to go out  much.  Still a lot of worry.  Once home in PM then can let go easier.  Goes to bed at 9 and sleeps good.  Normal function. No change in hopelessness.  No joy or looking forward.  I feel sad and lonely a good part of the time.  Enjoyed football game. Neuro yesterday doing well with early stage PD. Plan: Reduce paroxetine by 50% every 2 weeks down to 20 mg and stay at 20 mg. Start Viibryd 10 mg daily with 350 calories for 1 week then increase to 20 mg daily  12/10/20 appt noted:  Problem with cost Viibryd. Continued with Viibryd 20 mg daily and down to paroxetine 20 mg daily without withdrawal.  The terror of the outside world continues.  Still anxious around most other people and going out. For the first time in a long while,  over the last week, he felt joy and connection and he even made other people laugh.  Had less anxiety around them than usual.  Less hopeless.  Managing work better. Found copay card and last RX $45.   No SE and no withdrawal.  Plan:  paroxetine  stay at 20 mg until the increase in Viibryd can have benefit Increase Viibryd with 350 calories to 40 mg daily DC Deplin due to no response   01/07/2021 appointment with the following noted: The fear of the world remains but significantly less hopeless.  Very anxious over bankruptcy hearing today.  18 mos left on dealing with that. Son graduated from law-school recently.  He got 2 awards.  Left after he walked across the stage.  Couldn't stay in the crowd later.  Ongoing panic with agoraphobia.   Less death thoughts but still some. Works from house and stays there.   Tolerated Viibryd well.   NO SE Needs Klonopin and hydroxyzine. Plan: paroxetine  stay at 20 mg until the increase in Viibryd can have benefit continue Viibryd with 350 calories to 40 mg daily  02/06/21 appt with following noted: Viibryd 40 and paxil 20. Stopped lithium after 3 weeks DT diarrhea and NR. Managing but not great.  Not dramatic improvement but  thinks overall it is better than  with Paxil.  Less depressed than 6 mos ago.  But anxiety over normal activities even in grocery store or waiting room. Working hard.  Managing meetings.   Plan: Reduce Paxil to 10 mg for 2 weeks and stop it Increase Viibryd to 60 mg daily for better anxiety effects  04/03/2021 appointment with following noted: Couldn't afford Viibryd at $200/mo so dropped down to 40 mg daily. More crying on Viibryd even sobbing if not at work.  Feels very alone and hopeless.  Recurring dream disturbing with homesick feelings. Death thoughts without sui intent or plan.  Sleep good. Can do ok around people.  Able to respond to respond to positive stimuli. Plan:  Reduce Viibryd to 1/2 tablet 20 mg and start nortriptyline 25 mg 1 at night for 5 nights then, Continue Viibryd 20 mg daily and increase nortriptyline 2 of the 25 mg capsules for 5 nights, then Stop Viibryd and increase nortriptyline to 3 of the 25 mg capsules nightly. Wait 1 week and get the blood test in the morning. Disc SE  05/19/2021 appointment with the following noted: Cautiously optimistic with change to nortriptyline.  No longer homesick feeling, more confident and less crying.  Still doesn't like going anywhere.  Trying to reduce clonazepam.  Last Saturday 60th BD and really down bc kids didn't come to surprise him..  Lonely. PE last week was good. Handling going out easier.   Been on nortriptyline 75 mg for a month. No full panic lately.  Still leaving house and driving causes anxiety. No SE except 2-3 nights of indigestion. Sleep is good. Plan: Lithium augmentation option to try lower dose 150 bc diarrhea with 300 mg. Yes.  06/18/21 appt noted: Mood depression less severe without crying.  Anxiety still a problem.  Not happy or sad.  Nervous driving. Worrying PD may be worse. Clonazepam 1 mg does not cause sleepiness, dizziness. No particular effect of lihtium 150. Appetite reduced.   Propranolol keeps  BP down. Plan clonidine trial for anxiety  07/30/2021 appt noted: Sees benefit with clonidine 0.1 mg BID.  More at ease.  Still can worry.  Takes clonazepam prn.  Average 1.5 mg daily.  I feel better.  Less hopeless and enjoys things more. Often depression in January. SE constipation and dry mouth. Plan: Reduce propranolol to 40 mg BID bc borderline low BP Continue clonidine 0.1 mg twice daily which was helpful Continue lithium 150 nightly Continue lamotrigine 150 mg daily Continue nortriptyline 75 mg nightly Reduce propranolol to 40 mg twice daily due to 2 borderline low blood pressure Continue Klonopin 1 mg twice daily Try to reduce hydroxyzine to 25 mg 3 times daily and 25-50 nightly Continue gabapentin Discussed referral for [facility]  09/04/2021 appointment with the following noted: Covid 10 days ago first time.  Bad first 3 days. Reduced propranolol and lisinopril and now 1/2 tablet lisinopril. Feeling better despite Jan always terrible month for me.   Less fearful going  places but would prefer to be at home.   Plan: reduce hydroxyzine  to just prn 25 mg prn anxiety and it helps And 1-2 hs prn insomnia.  10/06/2021 appointment the following noted: Stopped hyrdroxyzine except prn going out. Lonely but not depressed other than mild seasonal. Terribly constipated using stool softener. Not much appetite and lost 30# over last 3-4 mos.  Not had to work at it. Anxiety is still better and "managed" generally.  Can still hget anxious in situations.   Doing AA by internet.  Sober  since 2006.  No history of BZ dependence. Asked about BZ for severe anxiety.  Past Psychiatric Medication Trials:  Brief duloxetine, sertraline 200, paroxetine 80, Viibryd 40, nortriptyline 75 buspirone dizzy, gabapentin,  Lorazepam , clonazepam is better Hydroxyzine  Propranolol 40 BID Lithium 300 diarrhea Abilify helped the depression and sense of dread but nothing for anxiety. Dx PD so can't use  risperidone  Review of Systems:   Review of Systems  Constitutional:  Positive for unexpected weight change. Negative for fatigue.  Cardiovascular:  Negative for chest pain and palpitations.  Gastrointestinal:  Positive for constipation. Negative for diarrhea and nausea.  Musculoskeletal:  Negative for arthralgias.  Neurological:  Positive for dizziness and tremors. Negative for weakness.  Psychiatric/Behavioral:  Negative for agitation, behavioral problems, confusion, decreased concentration, dysphoric mood, hallucinations, self-injury, sleep disturbance and suicidal ideas. The patient is nervous/anxious. The patient is not hyperactive.    Medications: I have reviewed the patient's current medications.  Current Outpatient Medications  Medication Sig Dispense Refill   aspirin EC 81 MG tablet Take 1 tablet (81 mg total) by mouth 2 (two) times daily. (Patient taking differently: Take 81 mg by mouth daily.) 60 tablet 0   carbidopa-levodopa (SINEMET IR) 25-100 MG tablet Take 2 tablets by mouth 3 (three) times daily before meals. 270 tablet 4   clonazePAM (KLONOPIN) 1 MG tablet TAKE 1 TABLET(1 MG) BY MOUTH TWICE DAILY 60 tablet 1   cloNIDine (CATAPRES) 0.1 MG tablet TAKE 1 TABLET(0.1 MG) BY MOUTH TWICE DAILY 60 tablet 0   hydrOXYzine (ATARAX) 25 MG tablet TAKE 1 TABLET BY MOUTH THREE TIMES DAILY AND AN ADDITIONAL 1 TO 2 TABLETS AT BEDTIME AS NEEDED FOR ANXIETY OR INSOMNIA 150 tablet 0   lamoTRIgine (LAMICTAL) 150 MG tablet TAKE 1 TABLET(150 MG) BY MOUTH AT BEDTIME 30 tablet 0   lithium carbonate 150 MG capsule TAKE 1 CAPSULE(150 MG) BY MOUTH DAILY 30 capsule 0   meloxicam (MOBIC) 15 MG tablet Take 15 mg by mouth daily.     methocarbamol (ROBAXIN) 500 MG tablet Take 500 mg by mouth 3 (three) times daily.     Multiple Vitamins-Minerals (MULTIVITAMIN WITH MINERALS) tablet Take 1 tablet by mouth daily.     nortriptyline (PAMELOR) 25 MG capsule TAKE 3 CAPSULES(75 MG) BY MOUTH AT BEDTIME 90 capsule 0    omeprazole (PRILOSEC) 20 MG capsule Take 20 mg by mouth 2 (two) times daily.     propranolol (INDERAL) 40 MG tablet Take 2 tablets (80 mg total) by mouth 2 (two) times daily. (Patient taking differently: Take 40 mg by mouth 2 (two) times daily.) 360 tablet 0   simvastatin (ZOCOR) 40 MG tablet Take 40 mg by mouth daily.     lisinopril-hydrochlorothiazide (PRINZIDE,ZESTORETIC) 20-12.5 MG per tablet Take 1 tablet by mouth 2 (two) times daily. 1/2 Adam daily     No current facility-administered medications for this visit.    Medication Side Effects: None  Allergies: No Known Allergies  Past Medical History:  Diagnosis Date   Alcoholism ([place])    sober since 01-2011    Anxiety    Arthritis    Balance problem    Complication of anesthesia    unsuccessful spinal with right knee replacement, used general anesthesia   Depression    Difficulty concentrating    Diverticulosis 2012   GERD (gastroesophageal reflux disease)    Hemorrhoids    Hyperlipidemia    Hypertension    Internal hemorrhoids 2012   Neuromuscular disorder ([place])  Parkinson   Obsessive compulsive disorder    Osteoarthritis    Rotator cuff tear, left    Tear of right rotator cuff 08/03/2014   Wears contact lenses     Family History  Problem Relation Age of Onset   Stroke Mother    Diabetes Mother    Cancer Father    Parkinson's disease Maternal Grandmother    Colon cancer Neg Hx    Colon polyps Neg Hx    Esophageal cancer Neg Hx    Stomach cancer Neg Hx    Rectal cancer Neg Hx     Social History   Socioeconomic History   Marital status: Divorced    Spouse name: Not on file   Number of children: 3   Years of education: Not on file   Highest education level: Bachelor's degree (e.g., BA, AB, BS)  Occupational History   Occupation: Investment banker, operational  Tobacco Use   Smoking status: Former    Types: E-cigarettes    Quit date: 08/01/2009    Years since quitting: 12.1   Smokeless tobacco: Never  Vaping  Use   Vaping Use: Every day   Start date: 08/12/2014  Substance and Sexual Activity   Alcohol use: No    Comment: not since 2006   Drug use: No    Comment: qut 1996   Sexual activity: Not on file    Comment: uses e-cig  Other Topics Concern   Not on file  Social History Narrative   Lives alone   No caffeine   Social Determinants of Health   Financial Resource Strain: Not on file  Food Insecurity: Not on file  Transportation Needs: Not on file  Physical Activity: Not on file  Stress: Not on file  Social Connections: Not on file  Intimate Partner Violence: Not on file    Past Medical History, Surgical history, Social history, and Family history were reviewed and updated as appropriate.   Please see review of systems for further details on the patient's review from today.   Objective:   Physical Exam:  There were no vitals taken for this visit.  Physical Exam Constitutional:      General: He is not in acute distress. Musculoskeletal:        General: No deformity.  Neurological:     Mental Status: He is alert and oriented to person, place, and time.     Cranial Nerves: No dysarthria.     Coordination: Coordination normal.  Psychiatric:        Attention and Perception: Attention and perception normal. He does not perceive auditory or visual hallucinations.        Mood and Affect: Mood is depressed. Mood is not anxious. Affect is not labile, blunt, angry or inappropriate.        Speech: Speech normal.        Behavior: Behavior normal. Behavior is cooperative.        Thought Content: Thought content normal. Thought content is not paranoid or delusional. Thought content does not include homicidal or suicidal ideation. Thought content does not include suicidal plan.        Cognition and Memory: Cognition and memory normal.        Judgment: Judgment normal.     Comments: Talkative. severe anxiety much better and depression is improved.    Lab Review:     Component  Value Date/Time   NA 136 08/02/2018 0458   K 5.0 08/02/2018 0458   CL 102 08/02/2018 0458  CO2 27 08/02/2018 0458   GLUCOSE 138 (H) 08/02/2018 0458   BUN 26 (H) 08/02/2018 0458   CREATININE 1.10 08/02/2018 0458   CALCIUM 9.0 08/02/2018 0458   PROT 7.6 08/12/2017 1445   ALBUMIN 4.3 08/12/2017 1445   AST 29 08/12/2017 1445   ALT 26 08/12/2017 1445   ALKPHOS 60 08/12/2017 1445   BILITOT 1.0 08/12/2017 1445   GFRNONAA >60 08/02/2018 0458   GFRAA >60 08/02/2018 0458       Component Value Date/Time   WBC 14.6 (H) 08/02/2018 0458   RBC 4.06 (L) 08/02/2018 0458   HGB 12.9 (L) 08/02/2018 0458   HCT 37.8 (L) 08/02/2018 0458   PLT 205 08/02/2018 0458   MCV 93.1 08/02/2018 0458   MCH 31.8 08/02/2018 0458   MCHC 34.1 08/02/2018 0458   RDW 12.9 08/02/2018 0458   LYMPHSABS 1.4 07/26/2018 0954   MONOABS 0.6 07/26/2018 0954   EOSABS 0.2 07/26/2018 0954   BASOSABS 0.0 07/26/2018 0954    No results found for: POCLITH, LITHIUM   No results found for: PHENYTOIN, PHENOBARB, VALPROATE, CBMZ   04/23/2021 nortriptyline level 113.  No med change indicated.  On nortriptyline 75 mg daily  .res Assessment: Plan:    Stephenson was seen today for follow-up, depression and anxiety.  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 We discussed Patient stated he has been on antidepressants very long time and wondered what his emotional state would be like off the medication.  This was partly influenced by online patient for him to described problems with withdrawal coming off of SSRIs.  He successfully came off antidepressant without  withdrawal symptoms.  But then has had relapse of both depression and anxiety within a few weeks off the antidepressant.  SX have been severe and persistent but is seeing  improvement from nortriptyline which is significant for depression but not much forr anxiety.  Depression is OK and Anxiety is better markedly with clonidine.  Overall much better than years.  Still would like less anxiety  Clonidine  0.1 mg BID was helpful.  Consider lithium, mirtazapine, Remeron, trazodone, Deplin .  Continue Lithium augmentation 150 bc diarrhea with 300 mg. Yes.  Transcranial Magnetic Stimulation TMS- [facility], [place] Other Option is Spravato ( nasal spray ketamine)  Switch to nortriptyline 75 HS led to improvement. 04/23/2021 nortriptyline level 113.  No med change indicated.  On nortriptyline 75 mg daily Disc SE.  Avoid sugar  Klonopin 1 mg BID  Disc risk triggering alcohol abuse.  He wants it bc feels desperate.  Disc fall risk.  We discussed the short-term risks associated with benzodiazepines including sedation and increased fall risk among others.  Discussed long-term side effect risk including dependence, potential withdrawal symptoms, and the potential eventual dose-related risk of dementia.  But recent studies from 2020 dispute this association between benzodiazepines and dementia risk. Newer studies in 2020 do not support an association with dementia. He doesn't notice SE He needs this mainly to leave the house.  reduce hydroxyzine  to just prn 25 mg prn anxiety and it helps And 1-2 hs prn insomnia. Continue gabapentin.    Discussed his chronic thoughts about  death. They resolved so far.  He commits to safety and is not having suicidal thoughts.  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   Disc BZ.   Extensive discussion of how Covid has allowed social anxiety to get worse but is functional.   Genesight DT TR sx.  Reviewed results at length.     Dx PD so can't use  risperidone  Support continued sobriety.  Continue AA.    Rec continue counseling for therapy.  Disc vitamin D in detail.  Vitamin D 5000 units daily until April 1  Constipation management 1.  Lots of water 2.  Powdered fiber supplement such as MiraLAX, Citrucel, etc. preferably with a meal 3.  2 stool softeners a day 4.  Milk of magnesia or magnesium tablets if needed  Disc concerns about polypharmacy which seems unavoidable at present Plan: Plan: No med changes propranolol per PCP Continue clonidine 0.1 mg twice daily which was helpful Continue lithium 150 nightly Continue lamotrigine 150 mg daily Continue nortriptyline 75 mg nightly Continue Klonopin 1 mg twice daily prn hydroxyzine to 25 mg 3 times daily and 25-50 nightly Continue gabapentin  Follow-up 4 weeks  Lynder Parents, MD, DFAPA     Please see After Visit Summary for patient specific instructions.  Future Appointments  Date Time Provider [place]  11/03/2021  2:30 PM [place], [place] MD CP-CP None  11/11/2021 11:30 AM Penumalli, Earlean Polka, MD GNA-GNA None    No orders of the defined types were placed in this encounter.      -------------------------------

## 2021-10-16 ENCOUNTER — Other Ambulatory Visit: Payer: Self-pay | Admitting: Psychiatry

## 2021-10-16 DIAGNOSIS — F4001 Agoraphobia with panic disorder: Secondary | ICD-10-CM

## 2021-10-16 DIAGNOSIS — F411 Generalized anxiety disorder: Secondary | ICD-10-CM

## 2021-10-16 DIAGNOSIS — F4310 Post-traumatic stress disorder, unspecified: Secondary | ICD-10-CM

## 2021-10-19 ENCOUNTER — Other Ambulatory Visit: Payer: Self-pay | Admitting: Psychiatry

## 2021-10-19 DIAGNOSIS — F4310 Post-traumatic stress disorder, unspecified: Secondary | ICD-10-CM

## 2021-10-19 DIAGNOSIS — F411 Generalized anxiety disorder: Secondary | ICD-10-CM

## 2021-10-19 DIAGNOSIS — F4001 Agoraphobia with panic disorder: Secondary | ICD-10-CM

## 2021-10-19 DIAGNOSIS — F4010 Social phobia, unspecified: Secondary | ICD-10-CM

## 2021-10-25 ENCOUNTER — Other Ambulatory Visit: Payer: Self-pay | Admitting: Psychiatry

## 2021-10-25 DIAGNOSIS — F332 Major depressive disorder, recurrent severe without psychotic features: Secondary | ICD-10-CM

## 2021-11-03 ENCOUNTER — Ambulatory Visit: Payer: 59 | Admitting: Psychiatry

## 2021-11-11 ENCOUNTER — Encounter: Payer: Self-pay | Admitting: Diagnostic Neuroimaging

## 2021-11-11 ENCOUNTER — Ambulatory Visit: Payer: 59 | Admitting: Diagnostic Neuroimaging

## 2021-11-11 ENCOUNTER — Other Ambulatory Visit: Payer: Self-pay

## 2021-11-11 VITALS — BP 108/74 | HR 76 | Ht 72.0 in | Wt 220.0 lb

## 2021-11-11 DIAGNOSIS — G20 Parkinson's disease: Secondary | ICD-10-CM

## 2021-11-11 MED ORDER — CARBIDOPA-LEVODOPA 25-100 MG PO TABS: 2.0000 | ORAL_TABLET | Freq: Three times a day (TID) | ORAL | 4 refills | Status: DC

## 2021-11-11 NOTE — Progress Notes (Signed)
? ?[facility] ? ?PATIENT: Adam Harvey ?DOB: Oct 16, 1960 ? ?REFERRING CLINICIAN: Geoffry Paradise, MD ?HISTORY FROM: patient  ?REASON FOR VISIT: follow up ? ?HISTORICAL ? ?CHIEF COMPLAINT:  ?Chief Complaint  ?Patient presents with  ? Follow-up  ?  RM 6 here for yearly f/u pt reports he has noticed some changes since last year. Tremors have increases, stumbling of gait, leaning forward, loosing train of thought, and writing is not as clear.   ? ? ?HISTORY OF PRESENT ILLNESS:  ? ?UPDATE (11/11/21, VRP): Since last visit, doing well, except some mild balance issues, leaning forward, losing train of thought, more tremor. Tolerating meds. No wearing off. ? ?UPDATE (11/05/2020, VRP): Since last visit, doing better on carbidopa levodopa, with great improvement tremor control.  Continues to have some issues with anxiety and panic attacks and agoraphobia.  He is working with psychiatry.  He is walking at least 5-6 times per week. ? ?PRIOR HPI (05/01/20): 61 year old right-handed male with hypertension, hypercholesterolemia, depression, anxiety, here for evaluation of tremors. ? ?Patient is noticed 3 to 4 months of intermittent right hand resting tremor.  Also has noted stiffness in muscles, apathy, anxiety, balance difficulty, ringing in ears, restless sleep.  Patient has family history of Parkinson's in his grandmother.  No change in smell or taste.  Had MRI of the brain which was unremarkable. ? ? ? ?REVIEW OF SYSTEMS: Full 14 system review of systems performed and negative with exception of: As per HPI. ? ?ALLERGIES: ?No Known Allergies ? ?HOME MEDICATIONS: ?Outpatient Medications Prior to Visit  ?Medication Sig Dispense Refill  ? aspirin EC 81 MG tablet Take 1 tablet (81 mg total) by mouth 2 (two) times daily. (Patient taking differently: Take 81 mg by mouth daily.) 60 tablet 0  ? clonazePAM (KLONOPIN) 1 MG tablet TAKE 1 TABLET(1 MG) BY MOUTH TWICE DAILY 60 tablet 1  ? cloNIDine (CATAPRES) 0.1 MG tablet  TAKE 1 TABLET(0.1 MG) BY MOUTH TWICE DAILY 120 tablet 0  ? hydrOXYzine (ATARAX) 25 MG tablet TAKE 1 TABLET BY MOUTH THREE TIMES DAILY AND AN ADDITIONAL 1-2 TABLET AT BEDTIME AS NEEDED FOR ANXIETY OR ASTHMA 150 tablet 0  ? lamoTRIgine (LAMICTAL) 150 MG tablet TAKE 1 TABLET(150 MG) BY MOUTH AT BEDTIME 30 tablet 0  ? lisinopril-hydrochlorothiazide (ZESTORETIC) 20-12.5 MG tablet Take by mouth daily. 1/2 tablet daily    ? lithium carbonate 150 MG capsule TAKE 1 CAPSULE(150 MG) BY MOUTH DAILY 30 capsule 0  ? meloxicam (MOBIC) 15 MG tablet Take 15 mg by mouth daily.    ? methocarbamol (ROBAXIN) 500 MG tablet Take 500 mg by mouth 3 (three) times daily.    ? Multiple Vitamins-Minerals (MULTIVITAMIN WITH MINERALS) tablet Take 1 tablet by mouth daily.    ? nortriptyline (PAMELOR) 25 MG capsule TAKE 3 CAPSULES(75 MG) BY MOUTH AT BEDTIME 90 capsule 0  ? omeprazole (PRILOSEC) 20 MG capsule Take 20 mg by mouth 2 (two) times daily.    ? propranolol (INDERAL) 40 MG tablet Take 2 tablets (80 mg total) by mouth 2 (two) times daily. (Patient taking differently: Take 40 mg by mouth 2 (two) times daily.) 360 tablet 0  ? simvastatin (ZOCOR) 40 MG tablet Take 40 mg by mouth daily.    ? carbidopa-levodopa (SINEMET IR) 25-100 MG tablet Take 2 tablets by mouth 3 (three) times daily before meals. 270 tablet 4  ? lisinopril-hydrochlorothiazide (PRINZIDE,ZESTORETIC) 20-12.5 MG per tablet Take 1 tablet by mouth 2 (two) times daily. 1/2 tab daily    ? ?  No facility-administered medications prior to visit.  ? ? ?PAST MEDICAL HISTORY: ?Past Medical History:  ?Diagnosis Date  ? Alcoholism (HCC)   ? sober since 01-2011   ? Anxiety   ? Arthritis   ? Balance problem   ? Complication of anesthesia   ? unsuccessful spinal with right knee replacement, used general anesthesia  ? Depression   ? Difficulty concentrating   ? Diverticulosis 2012  ? GERD (gastroesophageal reflux disease)   ? Hemorrhoids   ? Hyperlipidemia   ? Hypertension   ? Internal hemorrhoids  2012  ? Neuromuscular disorder (HCC)   ? Parkinson  ? Obsessive compulsive disorder   ? Osteoarthritis   ? Rotator cuff tear, left   ? Tear of right rotator cuff 08/03/2014  ? Wears contact lenses   ? ? ?PAST SURGICAL HISTORY: ?Past Surgical History:  ?Procedure Laterality Date  ? ABCESS DRAINAGE  2012  ? nasal cyst  ? ACHILLES TENDON REPAIR  2008  ? right  ? ARTHOSCOPIC ROTAOR CUFF REPAIR Right 08/03/2014  ? Procedure: ARTHROSCOPIC ROTATOR CUFF REPAIR;  Surgeon: Eulas PostJoshua P Landau, MD;  Location: [facility];  Service: Orthopedics;  Laterality: Right;  ? COLONOSCOPY  2012  ? INGUINAL HERNIA REPAIR    ? right as infant  ? KNEE ARTHROSCOPY Left   ? x4  ? KNEE SURGERY    ? x3-right  ? ROTATOR CUFF REPAIR Left 2011  ?  x 2  ? TONSILLECTOMY    ? TOTAL KNEE ARTHROPLASTY Right 12/2017  ? debride scar tissue  ? TOTAL KNEE ARTHROPLASTY Right 08/20/2017  ? Procedure: TOTAL KNEE ARTHROPLASTY;  Surgeon: Frederico Hammanaffrey, Daniel, MD;  Location: [facility];  Service: Orthopedics;  Laterality: Right;  ? TOTAL KNEE ARTHROPLASTY Left 08/01/2018  ? Procedure: TOTAL KNEE ARTHROPLASTY;  Surgeon: Gean Birchwoodowan, Frank, MD;  Location: [facility];  Service: Orthopedics;  Laterality: Left;  Adductor Block  ? TYMPANOSTOMY TUBE PLACEMENT    ? as child, several times  ? ? ?FAMILY HISTORY: ?Family History  ?Problem Relation Age of Onset  ? Stroke Mother   ? Diabetes Mother   ? Cancer Father   ? Parkinson's disease Maternal Grandmother   ? Colon cancer Neg Hx   ? Colon polyps Neg Hx   ? Esophageal cancer Neg Hx   ? Stomach cancer Neg Hx   ? Rectal cancer Neg Hx   ? ? ?SOCIAL HISTORY: ?Social History  ? ?Socioeconomic History  ? Marital status: Divorced  ?  Spouse name: Not on file  ? Number of children: 3  ? Years of education: Not on file  ? Highest education level: Bachelor's degree (e.g., BA, AB, BS)  ?Occupational History  ? Occupation: Tree surgeonrogram Director  ?Tobacco Use  ? Smoking status: Former  ?  Types: E-cigarettes  ?  Quit date: 08/01/2009  ?  Years since  quitting: 12.2  ? Smokeless tobacco: Never  ?Vaping Use  ? Vaping Use: Every day  ? Start date: 08/12/2014  ?Substance and Sexual Activity  ? Alcohol use: No  ?  Comment: not since 2006  ? Drug use: No  ?  Comment: qut 1996  ? Sexual activity: Not on file  ?  Comment: uses e-cig  ?Other Topics Concern  ? Not on file  ?Social History Narrative  ? Lives alone  ? No caffeine  ? ?Social Determinants of Health  ? ?Financial Resource Strain: Not on file  ?Food Insecurity: Not on file  ?Transportation Needs: Not on file  ?  Physical Activity: Not on file  ?Stress: Not on file  ?Social Connections: Not on file  ?Intimate Partner Violence: Not on file  ? ? ? ?PHYSICAL EXAM ? ?GENERAL EXAM/CONSTITUTIONAL: ?Vitals:  ?Vitals:  ? 11/11/21 1126  ?BP: 108/74  ?Pulse: 76  ?SpO2: 97%  ?Weight: 220 lb (99.8 kg)  ?Height: 6' (1.829 m)  ? ?Body mass index is 29.84 kg/m?. ?Wt Readings from Last 3 Encounters:  ?11/11/21 220 lb (99.8 kg)  ?09/25/21 235 lb (106.6 kg)  ?08/19/21 235 lb (106.6 kg)  ? ?Patient is in no distress; well developed, nourished and groomed; neck is supple ? ?CARDIOVASCULAR: ?Examination of carotid arteries is normal; no carotid bruits ?Regular rate and rhythm, no murmurs ?Examination of peripheral vascular system by observation and palpation is normal ? ?EYES: ?Ophthalmoscopic exam of optic discs and posterior segments is normal; no papilledema or hemorrhages ?No results found. ? ?MUSCULOSKELETAL: ?Gait, strength, tone, movements noted in Neurologic exam below ? ?NEUROLOGIC: ?MENTAL STATUS:  ?   ? View : No data to display.  ?  ?  ?  ? ?awake, alert, oriented to person, place and time ?recent and remote memory intact ?normal attention and concentration ?language fluent, comprehension intact, naming intact ?fund of knowledge appropriate ? ?CRANIAL NERVE:  ?2nd - no papilledema on fundoscopic exam ?2nd, 3rd, 4th, 6th - pupils equal and reactive to light, visual fields full to confrontation, extraocular muscles intact,  no nystagmus ?5th - facial sensation symmetric ?7th - facial strength symmetric ?8th - hearing intact ?9th - palate elevates symmetrically, uvula midline ?11th - shoulder shrug symmetric ?12th - tongu

## 2021-11-25 ENCOUNTER — Other Ambulatory Visit: Payer: Self-pay | Admitting: Psychiatry

## 2021-11-25 DIAGNOSIS — F4001 Agoraphobia with panic disorder: Secondary | ICD-10-CM

## 2021-11-25 DIAGNOSIS — F411 Generalized anxiety disorder: Secondary | ICD-10-CM

## 2021-11-25 DIAGNOSIS — F4010 Social phobia, unspecified: Secondary | ICD-10-CM

## 2021-11-25 DIAGNOSIS — F4310 Post-traumatic stress disorder, unspecified: Secondary | ICD-10-CM

## 2021-11-25 DIAGNOSIS — F332 Major depressive disorder, recurrent severe without psychotic features: Secondary | ICD-10-CM

## 2021-12-03 ENCOUNTER — Encounter: Payer: Self-pay | Admitting: Psychiatry

## 2021-12-03 ENCOUNTER — Ambulatory Visit (INDEPENDENT_AMBULATORY_CARE_PROVIDER_SITE_OTHER): Payer: 59 | Admitting: Psychiatry

## 2021-12-03 DIAGNOSIS — F411 Generalized anxiety disorder: Secondary | ICD-10-CM

## 2021-12-03 DIAGNOSIS — F4001 Agoraphobia with panic disorder: Secondary | ICD-10-CM | POA: Diagnosis not present

## 2021-12-03 DIAGNOSIS — F332 Major depressive disorder, recurrent severe without psychotic features: Secondary | ICD-10-CM

## 2021-12-03 DIAGNOSIS — F338 Other recurrent depressive disorders: Secondary | ICD-10-CM

## 2021-12-03 DIAGNOSIS — F4010 Social phobia, unspecified: Secondary | ICD-10-CM | POA: Diagnosis not present

## 2021-12-03 DIAGNOSIS — F1021 Alcohol dependence, in remission: Secondary | ICD-10-CM

## 2021-12-03 DIAGNOSIS — F4310 Post-traumatic stress disorder, unspecified: Secondary | ICD-10-CM

## 2021-12-03 NOTE — Progress Notes (Signed)
Adam Harvey ?[id number] ?1960-12-23 ?61 y.o.  ? ? ?Subjective:  ? ?Patient ID:  Adam Harvey is a 61 y.o. (DOB [birth year]) male. ? ?Chief Complaint:  ?Chief Complaint  ?Patient presents with  ? Follow-up  ? Depression  ? Anxiety  ? ? ?Anxiety ?Symptoms include dizziness and nervous/anxious behavior. Patient reports no confusion, decreased concentration, nausea, palpitations or suicidal ideas.  ? ? ?Medication Refill ?Pertinent negatives include no arthralgias, fatigue, nausea or weakness.  ?Depression ?       Associated symptoms include no decreased concentration, no fatigue and no suicidal ideas.  Past medical history includes anxiety.   ?Adam Harvey presents to the office today for follow-up of several diagnoses and wanted an earlier appointment because he wanted to restart duloxetine. ? ?visit January 16, 2019.  He had wanted a trial off of antidepressants in part because of reading on the Internet about withdrawal symptoms.  He had been off antidepressants for 2 months.  And felt more anxious and depressed.  We discussed it and decided to return to duloxetine and increase to 60 mg daily. ? ?visit in July after being back on the medication of duloxetine 60 mg daily he reported the following: ?It's great.  No depression but some anxiety that is managed.  He's 9/10 and satisfied.  Pleased with the dosage..  Tolerated well.  Sleep is good.  Better self care and better eating.  Working really hard but business is good.  In Holiday representative.  Location manager.  Energy better. ?Good appetite.  For years has had thoughts of if got terminal disease it would be easier and they are worse.  Hopelessness resolved. ?He had wanted to consider tapering off gabapentin and was given permission to do that if he chose to do so. ?He was continued on lamotrigine 150 mg daily. ? ?visit October 16 2019.   ?Don't feel depressed and kind of happy but lingering thoughts of "is this as good as it gets?".  Not consuming him.  Not much excitement about  things except work.  Doing design work for Teacher, early years/pre.  Some anxiety about being around people. Not DT Covid unless it made it worse.  Prefer to be alone.  Did a lot of theater in the past but not excited like he used to be.  Likes yard work.  Not interested in dating.  ?Wants to talk about Gabapentin for pain.  Previously thought it helped.  Wants to try to stop it and see if he can do without it.  Knees don't hurt bc replaced. ?Plan:  Cont duloxetine 60 and add Abilify 5 ?Taper gabapentin off ? ?12/25/19 appt, the following is noted: ?Anxiety not good.  Weary of dealing with it.  Had trouble dealing with number of people on the beach this weekend DT anxiety.  At times would like a BZ.  Always felt anxious but not managing it well.  Isolating. ?No SE gabapentin.  Did not stop it bc he forgot it. ?Plan: Increase gabapentin to 300 BID and 600 HS for 2 days, then incrase to 2 in AM  And HS and 1 in middle of the day for 4 days, then increase to 600 TID.  For a week, if NR then increase to 900  TID.   ? ?02/08/2020 appointment with the following noted: ?Increased gabapentin to 600 mg BID.  Has to nap if takes more.  No less anxious with the increase in dose.  No benefit to it. ?Asks about BZ.   ?Doesn't really want  to leave the house and anxiety driving.  Can get anxious even in yard bc afraid of having to talk to people.  No paranoia. ?Dread of things.  ?Emotional eating. ?Plan: Switch to paroxetine 30 mg for better antianxiety effect and hopefully help depression a little more.  ?Start one half of paroxetine 20 mg tablet and reduce duloxetine to 2 of the 20 mg capsules for 5 days, ?Then increase paroxetine to 1 tablet daily and reduce duloxetine to 1 capsule daily for 5 days, ?Then increase to 1-1/2 paroxetine tablets daily and stop duloxetine ?Continue Abilify 5 mg augmentation for residual anhedonia.  ? ?03/08/2020 phone call patient stating he felt Paxil was causing tremors in hands and feet and not helping with  anxiety.  He was instructed to reduce paroxetine to 20 mg daily and stop Abilify for 3 days and then restart Abilify 1/2 tablet. ?03/20/2020 phone call patient stating the above changes made no difference in tremor.  He was instructed to stop Abilify and increase paroxetine to 40 mg daily. ?04/01/2020 patient called stating his PCP wanted him to stop taking paroxetine completely to see if the tremors would resolve. ?05/01/2020 patient phone call stating he was diagnosed with Parkinson's disease but because of anxiety needed to be on some medication he was encouraged to restart the paroxetine which had never had an adequate trial for his anxiety. ?06/03/2020 phone call from patient complaining of panic attacks.  He had increase paroxetine to 40 mg a day and was given hydroxyzine as needed. ? ?06/07/2020 urgent appointment with the following noted: ?Not well.  Depression as bad as ever and anxiety through the roof.  Worse beginning of the week bc feels completely overwhelming.   Getting OOB, brushing teeth is hard, being in public hard. Social avoidance affecting work.  Don't feel scared of PD but slap in the face about age.  Single and in middle of 5 year bankruptcy.  Trouble with concentration. Not suicidal but death thoughts. Yesterday a good day and laughed first time in a while. Poor appetite and lost weight. ?On paroxetine 40 mg for 3 weeks. ?No SE.   ?Trouble staying asleep. 4-5  Hours. ?Plan: Needs more time to respond to paroxetine 40 mg daily ?Deplin 15 mg daily for depression  With samples. ?Increase hydroxyzine for ST anxiety. ? ?07/10/2020 appointment with the following noted: ?Depression is better but anxiety is through the roof.  Can laugh again.  Less hopeless and paralyzed by depression.  Now 5/10 depression.  Anxiety is still 10/10.  Will have nausea going out.  Has managed the anxiety pretty well.  Wonders if PD is causing anxiety.  Over the last 2-3 years more social anxiety.   ?No SE with  Paxil. ?Hydroxyzine TID and 2 HS.  Lorazepam helps but manages it. ?Plan: Increase paroxetine 60 mg daily.  It helped depression but not anxiety so far. ? ?08/06/20 appt with following noted: ?Cant tell a difference.  Anxiety still up.  BP up with stress.  Takes Ativan prn. ?Started hydroxyzine 75 HS and sleeping better.  If EMA then hard to get back to sleep.  Not anxious at night typically.  Around people tense and heart races. ?I don't feel as depressed but no decorations for Xmas for the first time.  Typically after Jan 1 falls into depression and a bit anxious over it. ?Plan: Continue paroxetine 60 mg daily at least another month.  It helped depression but not anxiety so far. ?Increase propranolol to 60-80 mg  TID for anxiety ? ?09/05/2020 appointment with following noted: ?Still awakens terrified of the world and hard to get OOB. ?Depression is still a bit of a problem. ?Going anywhere to deal with anyone makes him very anxious to the point of nausea.  When takes lorazepam it eases it off rather than eliminating it. ?Lorazepam 1 mg avg daily.  Really bad Xmas.  Even to the point of scared to go out with kids.  Once out he does ok usually. ?SE a little tired.  Not severe.   ?Hydroxyzine during day and 75 mg at night for sleep and it helps. ?No libido. ?Dx PD so can't use risperidone ?Plan: Increase paroxetine 80 mg daily at least another month.  It helped depression but not anxiety so far. ? ?10/10/2020 appointment with the following noted: ?Anxiety no better.  Hopeless and "waiting to die".  No joy or relaxation around the corner. ?Lorazepam helps otherwise shaking and sweating.  Cancelled appts yesterday.  Likes there's something inside the body that's twisting. ?Plan no med changes.  We will give paroxetine 80 mg another month or so to help. ? ?11/06/2020 appointment with the following noted: ?I still have pit in stomach but anxiety is better with less shaking sweating, SOB.  Still don't want to go out much.   Still a lot of worry.  Once home in PM then can let go easier.  Goes to bed at 9 and sleeps good.  Normal function. ?No change in hopelessness.  No joy or looking forward.  I feel sad and lonely a good part of the t

## 2021-12-19 ENCOUNTER — Other Ambulatory Visit: Payer: Self-pay | Admitting: Psychiatry

## 2021-12-19 DIAGNOSIS — F4310 Post-traumatic stress disorder, unspecified: Secondary | ICD-10-CM

## 2021-12-19 DIAGNOSIS — F411 Generalized anxiety disorder: Secondary | ICD-10-CM

## 2021-12-19 DIAGNOSIS — F4010 Social phobia, unspecified: Secondary | ICD-10-CM

## 2021-12-19 DIAGNOSIS — F4001 Agoraphobia with panic disorder: Secondary | ICD-10-CM

## 2021-12-24 ENCOUNTER — Other Ambulatory Visit: Payer: Self-pay | Admitting: Psychiatry

## 2021-12-24 DIAGNOSIS — F332 Major depressive disorder, recurrent severe without psychotic features: Secondary | ICD-10-CM

## 2021-12-24 DIAGNOSIS — F4001 Agoraphobia with panic disorder: Secondary | ICD-10-CM

## 2021-12-24 DIAGNOSIS — F4010 Social phobia, unspecified: Secondary | ICD-10-CM

## 2021-12-24 DIAGNOSIS — F411 Generalized anxiety disorder: Secondary | ICD-10-CM

## 2021-12-31 ENCOUNTER — Other Ambulatory Visit: Payer: Self-pay | Admitting: Psychiatry

## 2021-12-31 ENCOUNTER — Telehealth: Payer: Self-pay | Admitting: Psychiatry

## 2021-12-31 MED ORDER — TADALAFIL 20 MG PO TABS: 20.0000 mg | ORAL_TABLET | Freq: Every day | ORAL | 0 refills | Status: DC | PRN

## 2021-12-31 NOTE — Telephone Encounter (Signed)
Next visit is 02/12/21. Adam Harvey called requesting a new prescription for Cialis. He said spoke with Dr. Jennelle Human about it when he was here but pharmacy does not have it yet. His pharmacy is: ? ?[id number] - [place], [place] - [address] AT [facility] [address] ? ?Phone:  [phone]  ?Fax:  [phone]  ? ? ?Please call him at [phone] to let him know it was sent in.  ?

## 2021-12-31 NOTE — Telephone Encounter (Signed)
FYI, and prescription sent ?

## 2021-12-31 NOTE — Telephone Encounter (Signed)
Patient notified

## 2022-01-21 ENCOUNTER — Other Ambulatory Visit: Payer: Self-pay | Admitting: Psychiatry

## 2022-01-21 DIAGNOSIS — F332 Major depressive disorder, recurrent severe without psychotic features: Secondary | ICD-10-CM

## 2022-01-24 ENCOUNTER — Other Ambulatory Visit: Payer: Self-pay | Admitting: Psychiatry

## 2022-01-24 DIAGNOSIS — F332 Major depressive disorder, recurrent severe without psychotic features: Secondary | ICD-10-CM

## 2022-02-06 ENCOUNTER — Other Ambulatory Visit: Payer: Self-pay | Admitting: Psychiatry

## 2022-02-12 ENCOUNTER — Encounter: Payer: Self-pay | Admitting: Psychiatry

## 2022-02-12 ENCOUNTER — Ambulatory Visit (INDEPENDENT_AMBULATORY_CARE_PROVIDER_SITE_OTHER): Payer: 59 | Admitting: Psychiatry

## 2022-02-12 DIAGNOSIS — F411 Generalized anxiety disorder: Secondary | ICD-10-CM | POA: Diagnosis not present

## 2022-02-12 DIAGNOSIS — F1021 Alcohol dependence, in remission: Secondary | ICD-10-CM

## 2022-02-12 DIAGNOSIS — F4001 Agoraphobia with panic disorder: Secondary | ICD-10-CM | POA: Diagnosis not present

## 2022-02-12 DIAGNOSIS — F4010 Social phobia, unspecified: Secondary | ICD-10-CM | POA: Diagnosis not present

## 2022-02-12 DIAGNOSIS — F4310 Post-traumatic stress disorder, unspecified: Secondary | ICD-10-CM

## 2022-02-12 DIAGNOSIS — F332 Major depressive disorder, recurrent severe without psychotic features: Secondary | ICD-10-CM

## 2022-02-12 MED ORDER — CLONIDINE HCL 0.1 MG PO TABS: ORAL_TABLET | ORAL | 1 refills | Status: DC

## 2022-02-12 MED ORDER — NORTRIPTYLINE HCL 25 MG PO CAPS: 75.0000 mg | ORAL_CAPSULE | Freq: Every day | ORAL | 5 refills | Status: DC

## 2022-02-12 NOTE — Progress Notes (Signed)
Osmar Harvey [id number] 1961/04/03 61 y.o.    Subjective:   Patient ID:  Adam Harvey is a 61 y.o. (DOB 02-24-1961) male.  Chief Complaint:  Chief Complaint  Patient presents with   Follow-up   Anxiety   Depression   Post-Traumatic Stress Disorder    Anxiety Symptoms include dizziness and nervous/anxious behavior. Patient reports no confusion, decreased concentration, nausea, palpitations or suicidal ideas.    Medication Refill Pertinent negatives include no arthralgias, fatigue, nausea or weakness.  Depression        Associated symptoms include no decreased concentration, no fatigue and no suicidal ideas.  Past medical history includes anxiety.    Adam Harvey presents to the office today for follow-up of several diagnoses and wanted an earlier appointment because he wanted to restart duloxetine.  visit January 16, 2019.  He had wanted a trial off of antidepressants in part because of reading on the Internet about withdrawal symptoms.  He had been off antidepressants for 2 months.  And felt more anxious and depressed.  We discussed it and decided to return to duloxetine and increase to 60 mg daily.  visit in July after being back on the medication of duloxetine 60 mg daily he reported the following: It's great.  No depression but some anxiety that is managed.  He's 9/10 and satisfied.  Pleased with the dosage..  Tolerated well.  Sleep is good.  Better self care and better eating.  Working really hard but business is good.  In Holiday representative.  Location manager.  Energy better. Good appetite.  For years has had thoughts of if got terminal disease it would be easier and they are worse.  Hopelessness resolved. He had wanted to consider tapering off gabapentin and was given permission to do that if he chose to do so. He was continued on lamotrigine 150 mg daily.  visit October 16 2019.   Don't feel depressed and kind of happy but lingering thoughts of "is this as good as it gets?".  Not  consuming him.  Not much excitement about things except work.  Doing design work for Teacher, early years/pre.  Some anxiety about being around people. Not DT Covid unless it made it worse.  Prefer to be alone.  Did a lot of theater in the past but not excited like he used to be.  Likes yard work.  Not interested in dating.  Wants to talk about Gabapentin for pain.  Previously thought it helped.  Wants to try to stop it and see if he can do without it.  Knees don't hurt bc replaced. Plan:  Cont duloxetine 60 and add Abilify 5 Taper gabapentin off  12/25/19 appt, the following is noted: Anxiety not good.  Weary of dealing with it.  Had trouble dealing with number of people on the beach this weekend DT anxiety.  At times would like a BZ.  Always felt anxious but not managing it well.  Isolating. No SE gabapentin.  Did not stop it bc he forgot it. Plan: Increase gabapentin to 300 BID and 600 HS for 2 days, then incrase to 2 in AM  And HS and 1 in middle of the day for 4 days, then increase to 600 TID.  For a week, if NR then increase to 900  TID.    02/08/2020 appointment with the following noted: Increased gabapentin to 600 mg BID.  Has to nap if takes more.  No less anxious with the increase in dose.  No benefit to it. Asks about  BZ.   Doesn't really want to leave the house and anxiety driving.  Can get anxious even in yard bc afraid of having to talk to people.  No paranoia. Dread of things.  Emotional eating. Plan: Switch to paroxetine 30 mg for better antianxiety effect and hopefully help depression a little more.  Start one half of paroxetine 20 mg tablet and reduce duloxetine to 2 of the 20 mg capsules for 5 days, Then increase paroxetine to 1 tablet daily and reduce duloxetine to 1 capsule daily for 5 days, Then increase to 1-1/2 paroxetine tablets daily and stop duloxetine Continue Abilify 5 mg augmentation for residual anhedonia.   03/08/2020 phone call patient stating he felt Paxil was causing  tremors in hands and feet and not helping with anxiety.  He was instructed to reduce paroxetine to 20 mg daily and stop Abilify for 3 days and then restart Abilify 1/2 tablet. 03/20/2020 phone call patient stating the above changes made no difference in tremor.  He was instructed to stop Abilify and increase paroxetine to 40 mg daily. 04/01/2020 patient called stating his PCP wanted him to stop taking paroxetine completely to see if the tremors would resolve. 05/01/2020 patient phone call stating he was diagnosed with Parkinson's disease but because of anxiety needed to be on some medication he was encouraged to restart the paroxetine which had never had an adequate trial for his anxiety. 06/03/2020 phone call from patient complaining of panic attacks.  He had increase paroxetine to 40 mg a day and was given hydroxyzine as needed.  06/07/2020 urgent appointment with the following noted: Not well.  Depression as bad as ever and anxiety through the roof.  Worse beginning of the week bc feels completely overwhelming.   Getting OOB, brushing teeth is hard, being in public hard. Social avoidance affecting work.  Don't feel scared of PD but slap in the face about age.  Single and in middle of 5 year bankruptcy.  Trouble with concentration. Not suicidal but death thoughts. Yesterday a good day and laughed first time in a while. Poor appetite and lost weight. On paroxetine 40 mg for 3 weeks. No SE.   Trouble staying asleep. 4-5  Hours. Plan: Needs more time to respond to paroxetine 40 mg daily Deplin 15 mg daily for depression  With samples. Increase hydroxyzine for ST anxiety.  07/10/2020 appointment with the following noted: Depression is better but anxiety is through the roof.  Can laugh again.  Less hopeless and paralyzed by depression.  Now 5/10 depression.  Anxiety is still 10/10.  Will have nausea going out.  Has managed the anxiety pretty well.  Wonders if PD is causing anxiety.  Over the last 2-3 years  more social anxiety.   No SE with Paxil. Hydroxyzine TID and 2 HS.  Lorazepam helps but manages it. Plan: Increase paroxetine 60 mg daily.  It helped depression but not anxiety so far.  08/06/20 appt with following noted: Cant tell a difference.  Anxiety still up.  BP up with stress.  Takes Ativan prn. Started hydroxyzine 75 HS and sleeping better.  If EMA then hard to get back to sleep.  Not anxious at night typically.  Around people tense and heart races. I don't feel as depressed but no decorations for Xmas for the first time.  Typically after Jan 1 falls into depression and a bit anxious over it. Plan: Continue paroxetine 60 mg daily at least another month.  It helped depression but not anxiety so  far. Increase propranolol to 60-80 mg TID for anxiety  09/05/2020 appointment with following noted: Still awakens terrified of the world and hard to get OOB. Depression is still a bit of a problem. Going anywhere to deal with anyone makes him very anxious to the point of nausea.  When takes lorazepam it eases it off rather than eliminating it. Lorazepam 1 mg avg daily.  Really bad Xmas.  Even to the point of scared to go out with kids.  Once out he does ok usually. SE a little tired.  Not severe.   Hydroxyzine during day and 75 mg at night for sleep and it helps. No libido. Dx PD so can't use risperidone Plan: Increase paroxetine 80 mg daily at least another month.  It helped depression but not anxiety so far.  10/10/2020 appointment with the following noted: Anxiety no better.  Hopeless and "waiting to die".  No joy or relaxation around the corner. Lorazepam helps otherwise shaking and sweating.  Cancelled appts yesterday.  Likes there's something inside the body that's twisting. Plan no med changes.  We will give paroxetine 80 mg another month or so to help.  11/06/2020 appointment with the following noted: I still have pit in stomach but anxiety is better with less shaking sweating, SOB.   Still don't want to go out much.  Still a lot of worry.  Once home in PM then can let go easier.  Goes to bed at 9 and sleeps good.  Normal function. No change in hopelessness.  No joy or looking forward.  I feel sad and lonely a good part of the time.  Enjoyed football game. Neuro yesterday doing well with early stage PD. Plan: Reduce paroxetine by 50% every 2 weeks down to 20 mg and stay at 20 mg. Start Viibryd 10 mg daily with 350 calories for 1 week then increase to 20 mg daily  12/10/20 appt noted:  Problem with cost Viibryd. Continued with Viibryd 20 mg daily and down to paroxetine 20 mg daily without withdrawal.  The terror of the outside world continues.  Still anxious around most other people and going out. For the first time in a long while,  over the last week, he felt joy and connection and he even made other people laugh.  Had less anxiety around them than usual.  Less hopeless.  Managing work better. Found copay card and last RX $45.   No SE and no withdrawal.  Plan:  paroxetine  stay at 20 mg until the increase in Viibryd can have benefit Increase Viibryd with 350 calories to 40 mg daily DC Deplin due to no response   01/07/2021 appointment with the following noted: The fear of the world remains but significantly less hopeless.  Very anxious over bankruptcy hearing today.  18 mos left on dealing with that. Son graduated from law-school recently.  He got 2 awards.  Left after he walked across the stage.  Couldn't stay in the crowd later.  Ongoing panic with agoraphobia.   Less death thoughts but still some. Works from house and stays there.   Tolerated Viibryd well.   NO SE Needs Klonopin and hydroxyzine. Plan: paroxetine  stay at 20 mg until the increase in Viibryd can have benefit continue Viibryd with 350 calories to 40 mg daily  02/06/21 appt with following noted: Viibryd 40 and paxil 20. Stopped lithium after 3 weeks DT diarrhea and NR. Managing but not great.  Not  dramatic improvement but thinks overall it  is better than with Paxil.  Less depressed than 6 mos ago.  But anxiety over normal activities even in grocery store or waiting room. Working hard.  Managing meetings.   Plan: Reduce Paxil to 10 mg for 2 weeks and stop it Increase Viibryd to 60 mg daily for better anxiety effects  04/03/2021 appointment with following noted: Couldn't afford Viibryd at $200/mo so dropped down to 40 mg daily. More crying on Viibryd even sobbing if not at work.  Feels very alone and hopeless.  Recurring dream disturbing with homesick feelings. Death thoughts without sui intent or plan.  Sleep good. Can do ok around people.  Able to respond to respond to positive stimuli. Plan:  Reduce Viibryd to 1/2 tablet 20 mg and start nortriptyline 25 mg 1 at night for 5 nights then, Continue Viibryd 20 mg daily and increase nortriptyline 2 of the 25 mg capsules for 5 nights, then Stop Viibryd and increase nortriptyline to 3 of the 25 mg capsules nightly. Wait 1 week and get the blood test in the morning. Disc SE  05/19/2021 appointment with the following noted: Cautiously optimistic with change to nortriptyline.  No longer homesick feeling, more confident and less crying.  Still doesn't like going anywhere.  Trying to reduce clonazepam.  Last Saturday 60th BD and really down bc kids didn't come to surprise him..  Lonely. PE last week was good. Handling going out easier.   Been on nortriptyline 75 mg for a month. No full panic lately.  Still leaving house and driving causes anxiety. No SE except 2-3 nights of indigestion. Sleep is good. Plan: Lithium augmentation option to try lower dose 150 bc diarrhea with 300 mg. Yes.  06/18/21 appt noted: Mood depression less severe without crying.  Anxiety still a problem.  Not happy or sad.  Nervous driving. Worrying PD may be worse. Clonazepam 1 mg does not cause sleepiness, dizziness. No particular effect of lihtium 150. Appetite  reduced.   Propranolol keeps BP down. Plan clonidine trial for anxiety  07/30/2021 appt noted: Sees benefit with clonidine 0.1 mg BID.  More at ease.  Still can worry.  Takes clonazepam prn.  Average 1.5 mg daily.  I feel better.  Less hopeless and enjoys things more. Often depression in January. SE constipation and dry mouth. Plan: Reduce propranolol to 40 mg BID bc borderline low BP Continue clonidine 0.1 mg twice daily which was helpful Continue lithium 150 nightly Continue lamotrigine 150 mg daily Continue nortriptyline 75 mg nightly Reduce propranolol to 40 mg twice daily due to 2 borderline low blood pressure Continue Klonopin 1 mg twice daily Try to reduce hydroxyzine to 25 mg 3 times daily and 25-50 nightly Continue gabapentin Discussed referral for TMS  09/04/2021 appointment with the following noted: Covid 10 days ago first time.  Bad first 3 days. Reduced propranolol and lisinopril and now 1/2 tablet lisinopril. Feeling better despite Jan always terrible month for me.   Less fearful going  places but would prefer to be at home.   Plan: reduce hydroxyzine  to just prn 25 mg prn anxiety and it helps And 1-2 hs prn insomnia.  10/06/2021 appointment the following noted: Stopped hyrdroxyzine except prn going out. Lonely but not depressed other than mild seasonal. Terribly constipated using stool softener. Not much appetite and lost 30# over last 3-4 mos.  Not had to work at it. Anxiety is still better and "managed" generally.  Can still hget anxious in situations. Plan: Plan: Plan: No med  changes propranolol per PCP Continue clonidine 0.1 mg twice daily which was helpful Continue lithium 150 nightly Continue lamotrigine 150 mg daily Continue nortriptyline 75 mg nightly Continue Klonopin 1 mg twice daily prn hydroxyzine to 25 mg 3 times daily and 25-50 nightly, use LED Continue gabapentin  12/03/2021 appointment with the following noted: Off gabapentin and reduced  hydroxyzine. Stable usually.  More loneliness than depression.   Still anxious going anywhere but once there is fine. Little appetite and lost 40#. Some low BP lately.  Adjusting dose. Busy with work and a couple of close friends. Able to talk to kids more and longer now. SE dry mouth and ED Plan: no med changes  02/12/22 appt noted: Lost 50# with less appetite than in the past.  At 200# but plateaued. Back pain much better. Off hydroxyzine without problems. A weeks worth of rain and clouds caused depression. Resolved with better weather. Dating.  Lonely and still doesn't love being with people but does so. 3 very close friends.  That's enough.  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 Swamped with work and grateful for it.  10-11 big house projects. Dinner with sons tomorrow.  One is Clinical research associate and other retired Arts development officer. No panic.  Residual anxiety with people.  Feels safer at home. SE dry mouth and constipation.  Doing AA by internet.  Sober since 2006.  No history of BZ dependence. Asked about BZ for severe anxiety.  Past Psychiatric Medication Trials:  Brief duloxetine, sertraline 200, paroxetine 80, Viibryd 40, nortriptyline 75 buspirone dizzy, gabapentin,  Lorazepam , clonazepam is better Hydroxyzine  Propranolol 40 BID Lithium 300 diarrhea Lamotrigine 150 Abilify helped the depression and sense of dread but nothing for anxiety. Dx PD so can't use risperidone  Review of Systems:   Review of Systems  Constitutional:  Positive for unexpected weight change. Negative for fatigue.  Cardiovascular:  Negative for palpitations.  Gastrointestinal:  Positive for constipation. Negative for diarrhea and nausea.  Musculoskeletal:  Negative for arthralgias.  Neurological:  Positive for dizziness and tremors. Negative for weakness.  Psychiatric/Behavioral:  Negative for agitation, behavioral problems, confusion, decreased concentration, dysphoric mood, hallucinations, self-injury,  sleep disturbance and suicidal ideas. The patient is nervous/anxious. The patient is not hyperactive.     Medications: I have reviewed the patient's current medications.  Current Outpatient Medications  Medication Sig Dispense Refill   aspirin EC 81 MG tablet Take 1 tablet (81 mg total) by mouth 2 (two) times daily. (Patient taking differently: Take 81 mg by mouth daily.) 60 tablet 0   carbidopa-levodopa (SINEMET IR) 25-100 MG tablet Take 2 tablets by mouth 3 (three) times daily before meals. 270 tablet 4   clonazePAM (KLONOPIN) 1 MG tablet TAKE 1 TABLET(1 MG) BY MOUTH TWICE DAILY 60 tablet 3   lamoTRIgine (LAMICTAL) 150 MG tablet TAKE 1 TABLET(150 MG) BY MOUTH AT BEDTIME 90 tablet 0   lithium carbonate 150 MG capsule TAKE 1 CAPSULE(150 MG) BY MOUTH DAILY 90 capsule 0   meloxicam (MOBIC) 15 MG tablet Take 15 mg by mouth daily.     methocarbamol (ROBAXIN) 500 MG tablet Take 500 mg by mouth 3 (three) times daily.     Multiple Vitamins-Minerals (MULTIVITAMIN WITH MINERALS) tablet Take 1 tablet by mouth daily.     omeprazole (PRILOSEC) 20 MG capsule Take 20 mg by mouth 2 (two) times daily.     propranolol (INDERAL) 40 MG tablet Take 2 tablets (80 mg total) by mouth 2 (two) times  daily. (Patient taking differently: Take 40 mg by mouth 2 (two) times daily.) 360 tablet 0   simvastatin (ZOCOR) 40 MG tablet Take 40 mg by mouth daily.     tadalafil (CIALIS) 20 MG tablet TAKE 1 TABLET(20 MG) BY MOUTH DAILY AS NEEDED FOR ERECTILE DYSFUNCTION 10 tablet 3   cloNIDine (CATAPRES) 0.1 MG tablet TAKE 1 TABLET(0.1 MG) BY MOUTH TWICE DAILY 180 tablet 1   nortriptyline (PAMELOR) 25 MG capsule Take 3 capsules (75 mg total) by mouth at bedtime. 90 capsule 5   No current facility-administered medications for this visit.    Medication Side Effects: None  Allergies: No Known Allergies  Past Medical History:  Diagnosis Date   Alcoholism (HCC)    sober since 01-2011    Anxiety    Arthritis    Balance problem     Complication of anesthesia    unsuccessful spinal with right knee replacement, used general anesthesia   Depression    Difficulty concentrating    Diverticulosis 2012   GERD (gastroesophageal reflux disease)    Hemorrhoids    Hyperlipidemia    Hypertension    Internal hemorrhoids 2012   Neuromuscular disorder (HCC)    Parkinson   Obsessive compulsive disorder    Osteoarthritis    Rotator cuff tear, left    Tear of right rotator cuff 08/03/2014   Wears contact lenses     Family History  Problem Relation Age of Onset   Stroke Mother    Diabetes Mother    Cancer Father    Parkinson's disease Maternal Grandmother    Colon cancer Neg Hx    Colon polyps Neg Hx    Esophageal cancer Neg Hx    Stomach cancer Neg Hx    Rectal cancer Neg Hx     Social History   Socioeconomic History   Marital status: Divorced    Spouse name: Not on file   Number of children: 3   Years of education: Not on file   Highest education level: Bachelor's degree (e.g., BA, AB, BS)  Occupational History   Occupation: Tree surgeon  Tobacco Use   Smoking status: Former    Types: E-cigarettes    Quit date: 08/01/2009    Years since quitting: 12.5   Smokeless tobacco: Never  Vaping Use   Vaping Use: Every day   Start date: 08/12/2014  Substance and Sexual Activity   Alcohol use: No    Comment: not since 2006   Drug use: No    Comment: qut 1996   Sexual activity: Not on file    Comment: uses e-cig  Other Topics Concern   Not on file  Social History Narrative   Lives alone   No caffeine   Social Determinants of Health   Financial Resource Strain: Not on file  Food Insecurity: Not on file  Transportation Needs: Not on file  Physical Activity: Not on file  Stress: Not on file  Social Connections: Not on file  Intimate Partner Violence: Not on file    Past Medical History, Surgical history, Social history, and Family history were reviewed and updated as appropriate.   Please see  review of systems for further details on the patient's review from today.   Objective:   Physical Exam:  There were no vitals taken for this visit.  Physical Exam Constitutional:      General: He is not in acute distress. Musculoskeletal:        General: No deformity.  Neurological:  Mental Status: He is alert and oriented to person, place, and time.     Cranial Nerves: No dysarthria.     Coordination: Coordination normal.  Psychiatric:        Attention and Perception: Attention and perception normal. He does not perceive auditory or visual hallucinations.        Mood and Affect: Mood is depressed. Mood is not anxious. Affect is not labile, blunt, angry or inappropriate.        Speech: Speech normal.        Behavior: Behavior normal. Behavior is cooperative.        Thought Content: Thought content normal. Thought content is not paranoid or delusional. Thought content does not include homicidal or suicidal ideation. Thought content does not include suicidal plan.        Cognition and Memory: Cognition and memory normal.        Judgment: Judgment normal.     Comments: Talkative. severe anxiety much better and depression is improved so.     Lab Review:     Component Value Date/Time   NA 136 08/02/2018 0458   K 5.0 08/02/2018 0458   CL 102 08/02/2018 0458   CO2 27 08/02/2018 0458   GLUCOSE 138 (H) 08/02/2018 0458   BUN 26 (H) 08/02/2018 0458   CREATININE 1.10 08/02/2018 0458   CALCIUM 9.0 08/02/2018 0458   PROT 7.6 08/12/2017 1445   ALBUMIN 4.3 08/12/2017 1445   AST 29 08/12/2017 1445   ALT 26 08/12/2017 1445   ALKPHOS 60 08/12/2017 1445   BILITOT 1.0 08/12/2017 1445   GFRNONAA >60 08/02/2018 0458   GFRAA >60 08/02/2018 0458       Component Value Date/Time   WBC 14.6 (H) 08/02/2018 0458   RBC 4.06 (L) 08/02/2018 0458   HGB 12.9 (L) 08/02/2018 0458   HCT 37.8 (L) 08/02/2018 0458   PLT 205 08/02/2018 0458   MCV 93.1 08/02/2018 0458   MCH 31.8 08/02/2018 0458    MCHC 34.1 08/02/2018 0458   RDW 12.9 08/02/2018 0458   LYMPHSABS 1.4 07/26/2018 0954   MONOABS 0.6 07/26/2018 0954   EOSABS 0.2 07/26/2018 0954   BASOSABS 0.0 07/26/2018 0954    No results found for: "POCLITH", "LITHIUM"   No results found for: "PHENYTOIN", "PHENOBARB", "VALPROATE", "CBMZ"   04/23/2021 nortriptyline level 113.  No med change indicated.  On nortriptyline 75 mg daily  .res Assessment: Plan:    Abdulla was seen today for follow-up, anxiety, depression and post-traumatic stress disorder.  Diagnoses and all orders for this visit:  Moderately severe recurrent major depression (HCC) -     nortriptyline (PAMELOR) 25 MG capsule; Take 3 capsules (75 mg total) by mouth at bedtime.  GAD (generalized anxiety disorder) -     cloNIDine (CATAPRES) 0.1 MG tablet; TAKE 1 TABLET(0.1 MG) BY MOUTH TWICE DAILY  Panic disorder with agoraphobia -     cloNIDine (CATAPRES) 0.1 MG tablet; TAKE 1 TABLET(0.1 MG) BY MOUTH TWICE DAILY  Social anxiety disorder -     cloNIDine (CATAPRES) 0.1 MG tablet; TAKE 1 TABLET(0.1 MG) BY MOUTH TWICE DAILY  PTSD (post-traumatic stress disorder) -     cloNIDine (CATAPRES) 0.1 MG tablet; TAKE 1 TABLET(0.1 MG) BY MOUTH TWICE DAILY  Alcohol dependence, in remission (HCC)   Greater than 50% of 30 min face to face time with patient was spent on counseling and coordination of care. We discussed Patient stated he has been on antidepressants very long time and wondered what  his emotional state would be like off the medication.  This was partly influenced by online patient for him to described problems with withdrawal coming off of SSRIs.  He successfully came off antidepressant without withdrawal symptoms.  But then has had relapse of both depression and anxiety within a few weeks off the antidepressant.  SX have been severe and persistent but is seeing  improvement from nortriptyline which is significant for depression but not much forr anxiety.  Depression is OK  and Anxiety is better markedly with clonidine.  Overall much better than years.  Still would like less anxiety.  Residual social anxiety and not fully confident back.  Clonidine  0.1 mg BID was helpful for anxiety.  Consider lithium, mirtazapine, Remeron, trazodone, Deplin .  Continue Lithium augmentation 150 bc diarrhea with 300 mg. Yes.  Transcranial Magnetic Stimulation TMS- [facility], [place] Other Option is Spravato ( nasal spray ketamine)  Switch to nortriptyline 75 HS led to improvement. 04/23/2021 nortriptyline level 113.  No med change indicated.  On nortriptyline 75 mg daily Disc SE.  Avoid sugar Using fiber and Dulcolax  Klonopin 1 mg BID  Disc risk triggering alcohol abuse.  He wants it bc feels desperate.  Disc fall risk.  We discussed the short-term risks associated with benzodiazepines including sedation and increased fall risk among others.  Discussed long-term side effect risk including dependence, potential withdrawal symptoms, and the potential eventual dose-related risk of dementia.  But recent studies from 2020 dispute this association between benzodiazepines and dementia risk. Newer studies in 2020 do not support an association with dementia. He doesn't notice SE He needs this mainly to leave the house.  Use LED or stop if possible. hydroxyzine  to just prn 25 mg prn anxiety and it helps And 1-2 hs prn insomnia.  Discussed his chronic thoughts about death. They resolved so far.  He commits to safety and is not having suicidal thoughts.  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   Disc BZ.   Disc CBT ERP for anticipatory anxiety.  Extensive discussion of how Covid has allowed social anxiety to get worse but is functional.   Genesight DT TR sx.  Reviewed results at length.     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  Continue AA.    Rec continue counseling for therapy.  Disc ED meds.  Disc vitamin D in detail.  Vitamin D 5000 units  daily until April 1 Extensive discussion about  Constipation management 1.  Lots of water 2.  Powdered fiber supplement such as MiraLAX, Citrucel, etc. preferably with a meal 3.  2 stool softeners a day 4.  Milk of magnesia or magnesium tablets if needed  Disc concerns about polypharmacy which seems unavoidable at present Option lamotrigine increase if needed. Rec it if seasonal or weather worsening mood.  Plan: Plan: No med changes propranolol per PCP Continue clonidine 0.1 mg twice daily which was helpful Continue lithium 150 nightly Continue lamotrigine 150 mg daily Continue nortriptyline 75 mg nightly Continue Klonopin 1 mg twice daily  Follow-up 8 weeks  Meredith Staggers, MD, DFAPA     Please see After Visit Summary for patient specific instructions.  Future Appointments  Date Time Provider Department Center  11/11/2022 11:30 AM Penumalli, Glenford Bayley, MD [id number] None    No orders of the defined types were placed in this encounter.      -------------------------------

## 2022-03-03 ENCOUNTER — Other Ambulatory Visit: Payer: Self-pay | Admitting: Diagnostic Neuroimaging

## 2022-03-13 ENCOUNTER — Other Ambulatory Visit: Payer: Self-pay | Admitting: Psychiatry

## 2022-03-13 DIAGNOSIS — F332 Major depressive disorder, recurrent severe without psychotic features: Secondary | ICD-10-CM

## 2022-03-29 ENCOUNTER — Other Ambulatory Visit: Payer: Self-pay | Admitting: Psychiatry

## 2022-04-15 ENCOUNTER — Other Ambulatory Visit: Payer: Self-pay | Admitting: Psychiatry

## 2022-04-15 DIAGNOSIS — F4001 Agoraphobia with panic disorder: Secondary | ICD-10-CM

## 2022-04-15 DIAGNOSIS — F4010 Social phobia, unspecified: Secondary | ICD-10-CM

## 2022-04-15 DIAGNOSIS — F411 Generalized anxiety disorder: Secondary | ICD-10-CM

## 2022-04-15 NOTE — Telephone Encounter (Signed)
Last filled 8/2 appt 10/2

## 2022-05-18 ENCOUNTER — Encounter: Payer: Self-pay | Admitting: Psychiatry

## 2022-05-18 ENCOUNTER — Ambulatory Visit: Payer: 59 | Admitting: Psychiatry

## 2022-05-18 DIAGNOSIS — F4001 Agoraphobia with panic disorder: Secondary | ICD-10-CM

## 2022-05-18 DIAGNOSIS — F332 Major depressive disorder, recurrent severe without psychotic features: Secondary | ICD-10-CM

## 2022-05-18 DIAGNOSIS — F4010 Social phobia, unspecified: Secondary | ICD-10-CM

## 2022-05-18 DIAGNOSIS — F411 Generalized anxiety disorder: Secondary | ICD-10-CM

## 2022-05-18 DIAGNOSIS — F4310 Post-traumatic stress disorder, unspecified: Secondary | ICD-10-CM

## 2022-05-18 DIAGNOSIS — F1021 Alcohol dependence, in remission: Secondary | ICD-10-CM

## 2022-05-18 NOTE — Progress Notes (Signed)
Adam Harvey [id number] Nov 15, 1960 61 y.o.    Subjective:   Patient ID:  Adam Harvey is a 61 y.o. (DOB 03/16/1961) male.  Chief Complaint:  Chief Complaint  Patient presents with   Follow-up   Depression   Anxiety   Post-Traumatic Stress Disorder    Anxiety Symptoms include nervous/anxious behavior. Patient reports no confusion, decreased concentration, dizziness, nausea, palpitations or suicidal ideas.    Medication Refill Pertinent negatives include no arthralgias, fatigue, nausea or weakness.  Depression        Associated symptoms include no decreased concentration, no fatigue and no suicidal ideas.  Past medical history includes anxiety.    Adam Harvey presents to the office today for follow-up of several diagnoses and wanted an earlier appointment because he wanted to restart duloxetine.  visit January 16, 2019.  He had wanted a trial off of antidepressants in part because of reading on the Internet about withdrawal symptoms.  He had been off antidepressants for 2 months.  And felt more anxious and depressed.  We discussed it and decided to return to duloxetine and increase to 60 mg daily.  visit in July after being back on the medication of duloxetine 60 mg daily he reported the following: It's great.  No depression but some anxiety that is managed.  He's 9/10 and satisfied.  Pleased with the dosage..  Tolerated well.  Sleep is good.  Better self care and better eating.  Working really hard but business is good.  In Holiday representative.  Location manager.  Energy better. Good appetite.  For years has had thoughts of if got terminal disease it would be easier and they are worse.  Hopelessness resolved. He had wanted to consider tapering off gabapentin and was given permission to do that if he chose to do so. He was continued on lamotrigine 150 mg daily.  visit October 16 2019.   Don't feel depressed and kind of happy but lingering thoughts of "is this as good as it gets?".  Not consuming  him.  Not much excitement about things except work.  Doing design work for Teacher, early years/pre.  Some anxiety about being around people. Not DT Covid unless it made it worse.  Prefer to be alone.  Did a lot of theater in the past but not excited like he used to be.  Likes yard work.  Not interested in dating.  Wants to talk about Gabapentin for pain.  Previously thought it helped.  Wants to try to stop it and see if he can do without it.  Knees don't hurt bc replaced. Plan:  Cont duloxetine 60 and add Abilify 5 Taper gabapentin off  12/25/19 appt, the following is noted: Anxiety not good.  Weary of dealing with it.  Had trouble dealing with number of people on the beach this weekend DT anxiety.  At times would like a BZ.  Always felt anxious but not managing it well.  Isolating. No SE gabapentin.  Did not stop it bc he forgot it. Plan: Increase gabapentin to 300 BID and 600 HS for 2 days, then incrase to 2 in AM  And HS and 1 in middle of the day for 4 days, then increase to 600 TID.  For a week, if NR then increase to 900  TID.    02/08/2020 appointment with the following noted: Increased gabapentin to 600 mg BID.  Has to nap if takes more.  No less anxious with the increase in dose.  No benefit to it. Asks about BZ.  Doesn't really want to leave the house and anxiety driving.  Can get anxious even in yard bc afraid of having to talk to people.  No paranoia. Dread of things.  Emotional eating. Plan: Switch to paroxetine 30 mg for better antianxiety effect and hopefully help depression a little more.  Start one half of paroxetine 20 mg tablet and reduce duloxetine to 2 of the 20 mg capsules for 5 days, Then increase paroxetine to 1 tablet daily and reduce duloxetine to 1 capsule daily for 5 days, Then increase to 1-1/2 paroxetine tablets daily and stop duloxetine Continue Abilify 5 mg augmentation for residual anhedonia.   03/08/2020 phone call patient stating he felt Paxil was causing tremors in  hands and feet and not helping with anxiety.  He was instructed to reduce paroxetine to 20 mg daily and stop Abilify for 3 days and then restart Abilify 1/2 tablet. 03/20/2020 phone call patient stating the above changes made no difference in tremor.  He was instructed to stop Abilify and increase paroxetine to 40 mg daily. 04/01/2020 patient called stating his PCP wanted him to stop taking paroxetine completely to see if the tremors would resolve. 05/01/2020 patient phone call stating he was diagnosed with Parkinson's disease but because of anxiety needed to be on some medication he was encouraged to restart the paroxetine which had never had an adequate trial for his anxiety. 06/03/2020 phone call from patient complaining of panic attacks.  He had increase paroxetine to 40 mg a day and was given hydroxyzine as needed.  06/07/2020 urgent appointment with the following noted: Not well.  Depression as bad as ever and anxiety through the roof.  Worse beginning of the week bc feels completely overwhelming.   Getting OOB, brushing teeth is hard, being in public hard. Social avoidance affecting work.  Don't feel scared of PD but slap in the face about age.  Single and in middle of 5 year bankruptcy.  Trouble with concentration. Not suicidal but death thoughts. Yesterday a good day and laughed first time in a while. Poor appetite and lost weight. On paroxetine 40 mg for 3 weeks. No SE.   Trouble staying asleep. 4-5  Hours. Plan: Needs more time to respond to paroxetine 40 mg daily Deplin 15 mg daily for depression  With samples. Increase hydroxyzine for ST anxiety.  07/10/2020 appointment with the following noted: Depression is better but anxiety is through the roof.  Can laugh again.  Less hopeless and paralyzed by depression.  Now 5/10 depression.  Anxiety is still 10/10.  Will have nausea going out.  Has managed the anxiety pretty well.  Wonders if PD is causing anxiety.  Over the last 2-3 years more social  anxiety.   No SE with Paxil. Hydroxyzine TID and 2 HS.  Lorazepam helps but manages it. Plan: Increase paroxetine 60 mg daily.  It helped depression but not anxiety so far.  08/06/20 appt with following noted: Cant tell a difference.  Anxiety still up.  BP up with stress.  Takes Ativan prn. Started hydroxyzine 75 HS and sleeping better.  If EMA then hard to get back to sleep.  Not anxious at night typically.  Around people tense and heart races. I don't feel as depressed but no decorations for Xmas for the first time.  Typically after Jan 1 falls into depression and a bit anxious over it. Plan: Continue paroxetine 60 mg daily at least another month.  It helped depression but not anxiety so far. Increase propranolol  to 60-80 mg TID for anxiety  09/05/2020 appointment with following noted: Still awakens terrified of the world and hard to get OOB. Depression is still a bit of a problem. Going anywhere to deal with anyone makes him very anxious to the point of nausea.  When takes lorazepam it eases it off rather than eliminating it. Lorazepam 1 mg avg daily.  Really bad Xmas.  Even to the point of scared to go out with kids.  Once out he does ok usually. SE a little tired.  Not severe.   Hydroxyzine during day and 75 mg at night for sleep and it helps. No libido. Dx PD so can't use risperidone Plan: Increase paroxetine 80 mg daily at least another month.  It helped depression but not anxiety so far.  10/10/2020 appointment with the following noted: Anxiety no better.  Hopeless and "waiting to die".  No joy or relaxation around the corner. Lorazepam helps otherwise shaking and sweating.  Cancelled appts yesterday.  Likes there's something inside the body that's twisting. Plan no med changes.  We will give paroxetine 80 mg another month or so to help.  11/06/2020 appointment with the following noted: I still have pit in stomach but anxiety is better with less shaking sweating, SOB.  Still don't  want to go out much.  Still a lot of worry.  Once home in PM then can let go easier.  Goes to bed at 9 and sleeps good.  Normal function. No change in hopelessness.  No joy or looking forward.  I feel sad and lonely a good part of the time.  Enjoyed football game. Neuro yesterday doing well with early stage PD. Plan: Reduce paroxetine by 50% every 2 weeks down to 20 mg and stay at 20 mg. Start Viibryd 10 mg daily with 350 calories for 1 week then increase to 20 mg daily  12/10/20 appt noted:  Problem with cost Viibryd. Continued with Viibryd 20 mg daily and down to paroxetine 20 mg daily without withdrawal.  The terror of the outside world continues.  Still anxious around most other people and going out. For the first time in a long while,  over the last week, he felt joy and connection and he even made other people laugh.  Had less anxiety around them than usual.  Less hopeless.  Managing work better. Found copay card and last RX $45.   No SE and no withdrawal.  Plan:  paroxetine  stay at 20 mg until the increase in Viibryd can have benefit Increase Viibryd with 350 calories to 40 mg daily DC Deplin due to no response   01/07/2021 appointment with the following noted: The fear of the world remains but significantly less hopeless.  Very anxious over bankruptcy hearing today.  18 mos left on dealing with that. Son graduated from law-school recently.  He got 2 awards.  Left after he walked across the stage.  Couldn't stay in the crowd later.  Ongoing panic with agoraphobia.   Less death thoughts but still some. Works from house and stays there.   Tolerated Viibryd well.   NO SE Needs Klonopin and hydroxyzine. Plan: paroxetine  stay at 20 mg until the increase in Viibryd can have benefit continue Viibryd with 350 calories to 40 mg daily  02/06/21 appt with following noted: Viibryd 40 and paxil 20. Stopped lithium after 3 weeks DT diarrhea and NR. Managing but not great.  Not dramatic  improvement but thinks overall it is better than  with Paxil.  Less depressed than 6 mos ago.  But anxiety over normal activities even in grocery store or waiting room. Working hard.  Managing meetings.   Plan: Reduce Paxil to 10 mg for 2 weeks and stop it Increase Viibryd to 60 mg daily for better anxiety effects  04/03/2021 appointment with following noted: Couldn't afford Viibryd at $200/mo so dropped down to 40 mg daily. More crying on Viibryd even sobbing if not at work.  Feels very alone and hopeless.  Recurring dream disturbing with homesick feelings. Death thoughts without sui intent or plan.  Sleep good. Can do ok around people.  Able to respond to respond to positive stimuli. Plan:  Reduce Viibryd to 1/2 tablet 20 mg and start nortriptyline 25 mg 1 at night for 5 nights then, Continue Viibryd 20 mg daily and increase nortriptyline 2 of the 25 mg capsules for 5 nights, then Stop Viibryd and increase nortriptyline to 3 of the 25 mg capsules nightly. Wait 1 week and get the blood test in the morning. Disc SE  05/19/2021 appointment with the following noted: Cautiously optimistic with change to nortriptyline.  No longer homesick feeling, more confident and less crying.  Still doesn't like going anywhere.  Trying to reduce clonazepam.  Last Saturday 60th BD and really down bc kids didn't come to surprise him..  Lonely. PE last week was good. Handling going out easier.   Been on nortriptyline 75 mg for a month. No full panic lately.  Still leaving house and driving causes anxiety. No SE except 2-3 nights of indigestion. Sleep is good. Plan: Lithium augmentation option to try lower dose 150 bc diarrhea with 300 mg. Yes.  06/18/21 appt noted: Mood depression less severe without crying.  Anxiety still a problem.  Not happy or sad.  Nervous driving. Worrying PD may be worse. Clonazepam 1 mg does not cause sleepiness, dizziness. No particular effect of lihtium 150. Appetite reduced.    Propranolol keeps BP down. Plan clonidine trial for anxiety  07/30/2021 appt noted: Sees benefit with clonidine 0.1 mg BID.  More at ease.  Still can worry.  Takes clonazepam prn.  Average 1.5 mg daily.  I feel better.  Less hopeless and enjoys things more. Often depression in January. SE constipation and dry mouth. Plan: Reduce propranolol to 40 mg BID bc borderline low BP Continue clonidine 0.1 mg twice daily which was helpful Continue lithium 150 nightly Continue lamotrigine 150 mg daily Continue nortriptyline 75 mg nightly Reduce propranolol to 40 mg twice daily due to 2 borderline low blood pressure Continue Klonopin 1 mg twice daily Try to reduce hydroxyzine to 25 mg 3 times daily and 25-50 nightly Continue gabapentin Discussed referral for TMS  09/04/2021 appointment with the following noted: Covid 10 days ago first time.  Bad first 3 days. Reduced propranolol and lisinopril and now 1/2 tablet lisinopril. Feeling better despite Jan always terrible month for me.   Less fearful going  places but would prefer to be at home.   Plan: reduce hydroxyzine  to just prn 25 mg prn anxiety and it helps And 1-2 hs prn insomnia.  10/06/2021 appointment the following noted: Stopped hyrdroxyzine except prn going out. Lonely but not depressed other than mild seasonal. Terribly constipated using stool softener. Not much appetite and lost 30# over last 3-4 mos.  Not had to work at it. Anxiety is still better and "managed" generally.  Can still hget anxious in situations. Plan: Plan: Plan: No med changes propranolol per  PCP Continue clonidine 0.1 mg twice daily which was helpful Continue lithium 150 nightly Continue lamotrigine 150 mg daily Continue nortriptyline 75 mg nightly Continue Klonopin 1 mg twice daily prn hydroxyzine to 25 mg 3 times daily and 25-50 nightly, use LED Continue gabapentin  12/03/2021 appointment with the following noted: Off gabapentin and reduced  hydroxyzine. Stable usually.  More loneliness than depression.   Still anxious going anywhere but once there is fine. Little appetite and lost 40#. Some low BP lately.  Adjusting dose. Busy with work and a couple of close friends. Able to talk to kids more and longer now. SE dry mouth and ED Plan: no med changes  02/12/22 appt noted: Lost 50# with less appetite than in the past.  At 200# but plateaued. Back pain much better. Off hydroxyzine without problems. A weeks worth of rain and clouds caused depression. Resolved with better weather. Dating.  Lonely and still doesn't love being with people but does so. 3 very close friends.  That's enough.  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 Swamped with work and grateful for it.  10-11 big house projects. Dinner with sons tomorrow.  One is Clinical research associate and other retired Arts development officer. No panic.  Residual anxiety with people.  Feels safer at home. SE dry mouth and constipation. Plan no med changes  05/18/22 appt noted: Even better, than in 5-10 years. 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 Continued social anxiety and avoids some things. Gradually reducing clonazepam.   SE dry mouth and constipation managed. Appetite is back. And gained back 5# gain. No longer has death thoughts. Dating.  Pleased.  Business is good. Sleep is good.   Pleased with meds. Able to reduce Parkinson's meds.   Doing AA by internet.  Sober since 2006.  No history of BZ dependence.   Past Psychiatric Medication Trials:  Brief duloxetine, sertraline 200, paroxetine 80, Viibryd 40, nortriptyline 75 buspirone dizzy, gabapentin,  Lorazepam , clonazepam is better Hydroxyzine  Propranolol 40 BID Lithium 300 diarrhea Lamotrigine 150 Abilify helped the depression and sense of dread but nothing for anxiety. Dx PD so can't use risperidone  Review of Systems:   Review of Systems  Constitutional:  Positive for unexpected  weight change. Negative for fatigue.  Cardiovascular:  Negative for palpitations.  Gastrointestinal:  Positive for constipation. Negative for diarrhea and nausea.  Musculoskeletal:  Positive for back pain. Negative for arthralgias.  Neurological:  Negative for dizziness, tremors and weakness.  Psychiatric/Behavioral:  Negative for agitation, behavioral problems, confusion, decreased concentration, dysphoric mood, hallucinations, self-injury, sleep disturbance and suicidal ideas. The patient is nervous/anxious. The patient is not hyperactive.     Medications: I have reviewed the patient's current medications.  Current Outpatient Medications  Medication Sig Dispense Refill   aspirin EC 81 MG tablet Take 1 tablet (81 mg total) by mouth 2 (two) times daily. (Patient taking differently: Take 81 mg by mouth daily.) 60 tablet 0   carbidopa-levodopa (SINEMET IR) 25-100 MG tablet Take 2 tablets by mouth 3 (three) times daily before meals. 270 tablet 4   clonazePAM (KLONOPIN) 1 MG tablet TAKE 1 TABLET(1 MG) BY MOUTH TWICE DAILY 60 tablet 1   cloNIDine (CATAPRES) 0.1 MG tablet TAKE 1 TABLET(0.1 MG) BY MOUTH TWICE DAILY 180 tablet 1   lamoTRIgine (LAMICTAL) 150 MG tablet TAKE 1 TABLET(150 MG) BY MOUTH AT BEDTIME 90 tablet 0   lithium carbonate 150  MG capsule TAKE 1 CAPSULE(150 MG) BY MOUTH DAILY 90 capsule 0   meloxicam (MOBIC) 15 MG tablet Take 15 mg by mouth daily.     methocarbamol (ROBAXIN) 500 MG tablet Take 500 mg by mouth 3 (three) times daily.     Multiple Vitamins-Minerals (MULTIVITAMIN WITH MINERALS) tablet Take 1 tablet by mouth daily.     nortriptyline (PAMELOR) 25 MG capsule Take 3 capsules (75 mg total) by mouth at bedtime. 90 capsule 5   omeprazole (PRILOSEC) 20 MG capsule Take 20 mg by mouth 2 (two) times daily.     propranolol (INDERAL) 40 MG tablet Take 2 tablets (80 mg total) by mouth 2 (two) times daily. (Patient taking differently: Take 40 mg by mouth 2 (two) times daily.) 360 tablet  0   simvastatin (ZOCOR) 40 MG tablet Take 40 mg by mouth daily.     tadalafil (CIALIS) 20 MG tablet TAKE 1 TABLET(20 MG) BY MOUTH DAILY AS NEEDED FOR ERECTILE DYSFUNCTION 10 tablet 3   No current facility-administered medications for this visit.    Medication Side Effects: None  Allergies: No Known Allergies  Past Medical History:  Diagnosis Date   Alcoholism (HCC)    sober since 01-2011    Anxiety    Arthritis    Balance problem    Complication of anesthesia    unsuccessful spinal with right knee replacement, used general anesthesia   Depression    Difficulty concentrating    Diverticulosis 2012   GERD (gastroesophageal reflux disease)    Hemorrhoids    Hyperlipidemia    Hypertension    Internal hemorrhoids 2012   Neuromuscular disorder (HCC)    Parkinson   Obsessive compulsive disorder    Osteoarthritis    Rotator cuff tear, left    Tear of right rotator cuff 08/03/2014   Wears contact lenses     Family History  Problem Relation Age of Onset   Stroke Mother    Diabetes Mother    Cancer Father    Parkinson's disease Maternal Grandmother    Colon cancer Neg Hx    Colon polyps Neg Hx    Esophageal cancer Neg Hx    Stomach cancer Neg Hx    Rectal cancer Neg Hx     Social History   Socioeconomic History   Marital status: Divorced    Spouse name: Not on file   Number of children: 3   Years of education: Not on file   Highest education level: Bachelor's degree (e.g., BA, AB, BS)  Occupational History   Occupation: Tree surgeon  Tobacco Use   Smoking status: Former    Types: E-cigarettes    Quit date: 08/01/2009    Years since quitting: 12.8   Smokeless tobacco: Never  Vaping Use   Vaping Use: Every day   Start date: 08/12/2014  Substance and Sexual Activity   Alcohol use: No    Comment: not since 2006   Drug use: No    Comment: qut 1996   Sexual activity: Not on file    Comment: uses e-cig  Other Topics Concern   Not on file  Social History  Narrative   Lives alone   No caffeine   Social Determinants of Health   Financial Resource Strain: Not on file  Food Insecurity: Not on file  Transportation Needs: Not on file  Physical Activity: Not on file  Stress: Not on file  Social Connections: Not on file  Intimate Partner Violence: Not on file  Past Medical History, Surgical history, Social history, and Family history were reviewed and updated as appropriate.   Please see review of systems for further details on the patient's review from today.   Objective:   Physical Exam:  There were no vitals taken for this visit.  Physical Exam Constitutional:      General: He is not in acute distress. Musculoskeletal:        General: No deformity.  Neurological:     Mental Status: He is alert and oriented to person, place, and time.     Cranial Nerves: No dysarthria.     Coordination: Coordination normal.  Psychiatric:        Attention and Perception: Attention and perception normal. He does not perceive auditory or visual hallucinations.        Mood and Affect: Mood is depressed. Mood is not anxious. Affect is not labile, blunt, angry or inappropriate.        Speech: Speech normal.        Behavior: Behavior normal. Behavior is cooperative.        Thought Content: Thought content normal. Thought content is not paranoid or delusional. Thought content does not include homicidal or suicidal ideation. Thought content does not include suicidal plan.        Cognition and Memory: Cognition and memory normal.        Judgment: Judgment normal.     Comments: Talkative. severe anxiety much better and depression is improved so.     Lab Review:     Component Value Date/Time   NA 136 08/02/2018 0458   K 5.0 08/02/2018 0458   CL 102 08/02/2018 0458   CO2 27 08/02/2018 0458   GLUCOSE 138 (H) 08/02/2018 0458   BUN 26 (H) 08/02/2018 0458   CREATININE 1.10 08/02/2018 0458   CALCIUM 9.0 08/02/2018 0458   PROT 7.6 08/12/2017 1445    ALBUMIN 4.3 08/12/2017 1445   AST 29 08/12/2017 1445   ALT 26 08/12/2017 1445   ALKPHOS 60 08/12/2017 1445   BILITOT 1.0 08/12/2017 1445   GFRNONAA >60 08/02/2018 0458   GFRAA >60 08/02/2018 0458       Component Value Date/Time   WBC 14.6 (H) 08/02/2018 0458   RBC 4.06 (L) 08/02/2018 0458   HGB 12.9 (L) 08/02/2018 0458   HCT 37.8 (L) 08/02/2018 0458   PLT 205 08/02/2018 0458   MCV 93.1 08/02/2018 0458   MCH 31.8 08/02/2018 0458   MCHC 34.1 08/02/2018 0458   RDW 12.9 08/02/2018 0458   LYMPHSABS 1.4 07/26/2018 0954   MONOABS 0.6 07/26/2018 0954   EOSABS 0.2 07/26/2018 0954   BASOSABS 0.0 07/26/2018 0954    No results found for: "POCLITH", "LITHIUM"   No results found for: "PHENYTOIN", "PHENOBARB", "VALPROATE", "CBMZ"   04/23/2021 nortriptyline level 113.  No med change indicated.  On nortriptyline 75 mg daily  .res Assessment: Plan:    Adam Harvey was seen today for follow-up, depression, anxiety and post-traumatic stress disorder.  Diagnoses and all orders for this visit:  Moderately severe recurrent major depression (HCC)  GAD (generalized anxiety disorder)  Panic disorder with agoraphobia  Social anxiety disorder  PTSD (post-traumatic stress disorder)  Alcohol dependence, in remission (HCC)   Greater than 50% of 30 min face to face time with patient was spent on counseling and coordination of care. We discussed Patient stated he has been on antidepressants very long time and wondered what his emotional state would be like off the medication.  This  was partly influenced by online patient for him to described problems with withdrawal coming off of SSRIs.  He successfully came off antidepressant without withdrawal symptoms.  But then has had relapse of both depression and anxiety within a few weeks off the antidepressant.  SX have been severe and persistent but is seeing  improvement from nortriptyline which is significant for depression but not much forr  anxiety.  Depression is OK and Anxiety is better markedly with clonidine.  Overall much better than years.  Still would like less anxiety.  Residual social anxiety and not fully confident back.  Clonidine  0.1 mg BID was helpful for anxiety.  Consider lithium, mirtazapine, Remeron, trazodone, Deplin .  Continue Lithium augmentation 150 bc diarrhea with 300 mg.   Transcranial Magnetic Stimulation TMS- [facility], [place] Other Option is Spravato ( nasal spray ketamine)  Switch to nortriptyline 75 HS led to improvement. 04/23/2021 nortriptyline level 113.  No med change indicated.  On nortriptyline 75 mg daily Disc SE.  Avoid sugar Using fiber and Dulcolax  Klonopin 1 mg BID  Disc risk triggering alcohol abuse.  He wants it bc feels desperate.  Disc fall risk.  We discussed the short-term risks associated with benzodiazepines including sedation and increased fall risk among others.  Discussed long-term side effect risk including dependence, potential withdrawal symptoms, and the potential eventual dose-related risk of dementia.  But recent studies from 2020 dispute this association between benzodiazepines and dementia risk. Newer studies in 2020 do not support an association with dementia. He doesn't notice SE He needs this mainly to leave the house.  Use LED or stop if possible. hydroxyzine  to just prn 25 mg prn anxiety and it helps And 1-2 hs prn insomnia.  Discussed his chronic thoughts about death. They resolved so far.  He commits to safety and is not having suicidal thoughts.  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   Disc BZ.   Disc CBT ERP for anticipatory anxiety.  Extensive discussion of how Covid has allowed social anxiety to get worse but is functional.   Genesight DT TR sx.  Reviewed results at length.    Support continued sobriety.  Continue AA.    Rec continue counseling for therapy.  Disc ED meds.  Disc vitamin D in detail.  Vitamin D 5000 units daily until  April 1 Extensive discussion about  Constipation management 1.  Lots of water 2.  Powdered fiber supplement such as MiraLAX, Citrucel, etc. preferably with a meal 3.  2 stool softeners a day 4.  Milk of magnesia or magnesium tablets if needed  Disc concerns about polypharmacy which seems unavoidable at present Option lamotrigine increase if needed. Rec it if seasonal or weather worsening mood.  Plan: Plan: No med changes propranolol per PCP Continue clonidine 0.1 mg twice daily which was helpful for anxiety and BP Continue lithium 150 nightly Continue lamotrigine 150 mg daily Continue nortriptyline 75 mg nightly Continue Klonopin 1 mg twice daily  Follow-up 12 weeks  Meredith Staggersarey Cottle, MD, DFAPA     Please see After Visit Summary for patient specific instructions.  Future Appointments  Date Time Provider Department Center  11/11/2022 11:30 AM Penumalli, Glenford [place]ikram R, MD [facility] None    No orders of the defined types were placed in this encounter.      -------------------------------

## 2022-06-01 ENCOUNTER — Other Ambulatory Visit: Payer: Self-pay | Admitting: Psychiatry

## 2022-06-10 ENCOUNTER — Other Ambulatory Visit: Payer: Self-pay | Admitting: Psychiatry

## 2022-06-10 DIAGNOSIS — F4001 Agoraphobia with panic disorder: Secondary | ICD-10-CM

## 2022-06-10 DIAGNOSIS — F411 Generalized anxiety disorder: Secondary | ICD-10-CM

## 2022-06-10 DIAGNOSIS — F4010 Social phobia, unspecified: Secondary | ICD-10-CM

## 2022-06-23 ENCOUNTER — Other Ambulatory Visit: Payer: Self-pay | Admitting: Psychiatry

## 2022-07-20 ENCOUNTER — Telehealth: Payer: Self-pay | Admitting: Psychiatry

## 2022-07-20 ENCOUNTER — Other Ambulatory Visit: Payer: Self-pay

## 2022-07-20 DIAGNOSIS — F332 Major depressive disorder, recurrent severe without psychotic features: Secondary | ICD-10-CM

## 2022-07-20 MED ORDER — LITHIUM CARBONATE 150 MG PO CAPS: ORAL_CAPSULE | ORAL | 0 refills | Status: DC

## 2022-07-20 NOTE — Telephone Encounter (Signed)
Apt 09/22/22 Pt is requesting a 90 day RF on Lith Carbonate RX.  Send to : [id number] - [place] - [address] AT [facility] OF [address] & [facility] [address]

## 2022-07-20 NOTE — Telephone Encounter (Signed)
Rx sent 

## 2022-08-17 ENCOUNTER — Other Ambulatory Visit: Payer: Self-pay | Admitting: Psychiatry

## 2022-08-17 DIAGNOSIS — F332 Major depressive disorder, recurrent severe without psychotic features: Secondary | ICD-10-CM

## 2022-09-04 ENCOUNTER — Other Ambulatory Visit: Payer: Self-pay | Admitting: Psychiatry

## 2022-09-04 DIAGNOSIS — F411 Generalized anxiety disorder: Secondary | ICD-10-CM

## 2022-09-04 DIAGNOSIS — F4001 Agoraphobia with panic disorder: Secondary | ICD-10-CM

## 2022-09-04 DIAGNOSIS — F4010 Social phobia, unspecified: Secondary | ICD-10-CM

## 2022-09-04 NOTE — Telephone Encounter (Signed)
Filled 12/21 appt 2/6

## 2022-09-22 ENCOUNTER — Encounter: Payer: Self-pay | Admitting: Psychiatry

## 2022-09-22 ENCOUNTER — Ambulatory Visit (INDEPENDENT_AMBULATORY_CARE_PROVIDER_SITE_OTHER): Payer: 59 | Admitting: Psychiatry

## 2022-09-22 DIAGNOSIS — F411 Generalized anxiety disorder: Secondary | ICD-10-CM

## 2022-09-22 DIAGNOSIS — F332 Major depressive disorder, recurrent severe without psychotic features: Secondary | ICD-10-CM | POA: Diagnosis not present

## 2022-09-22 DIAGNOSIS — F4001 Agoraphobia with panic disorder: Secondary | ICD-10-CM

## 2022-09-22 DIAGNOSIS — F4010 Social phobia, unspecified: Secondary | ICD-10-CM | POA: Diagnosis not present

## 2022-09-22 DIAGNOSIS — F4310 Post-traumatic stress disorder, unspecified: Secondary | ICD-10-CM

## 2022-09-22 DIAGNOSIS — F1021 Alcohol dependence, in remission: Secondary | ICD-10-CM

## 2022-09-22 MED ORDER — LITHIUM CARBONATE 150 MG PO CAPS: ORAL_CAPSULE | ORAL | 1 refills | Status: DC

## 2022-09-22 MED ORDER — CLONIDINE HCL 0.1 MG PO TABS: ORAL_TABLET | ORAL | 1 refills | Status: DC

## 2022-09-22 MED ORDER — NORTRIPTYLINE HCL 25 MG PO CAPS: 75.0000 mg | ORAL_CAPSULE | Freq: Every day | ORAL | 1 refills | Status: DC

## 2022-09-22 MED ORDER — TADALAFIL 20 MG PO TABS: 20.0000 mg | ORAL_TABLET | Freq: Every day | ORAL | 3 refills | Status: DC | PRN

## 2022-09-22 MED ORDER — LAMOTRIGINE 150 MG PO TABS: 150.0000 mg | ORAL_TABLET | Freq: Two times a day (BID) | ORAL | 1 refills | Status: DC

## 2022-09-22 MED ORDER — CLONAZEPAM 1 MG PO TABS: 1.0000 mg | ORAL_TABLET | Freq: Two times a day (BID) | ORAL | 5 refills | Status: DC

## 2022-09-22 MED ORDER — PROPRANOLOL HCL 40 MG PO TABS: 40.0000 mg | ORAL_TABLET | Freq: Two times a day (BID) | ORAL | 1 refills | Status: DC

## 2022-09-22 NOTE — Progress Notes (Signed)
HARDY WICHT [id number] 01-28-1961 62 y.o.    Subjective:   Patient ID:  Adam Harvey is a 62 y.o. (DOB 1960/12/30) male.  Chief Complaint:  Chief Complaint  Patient presents with   Follow-up   Anxiety   Depression   Post-Traumatic Stress Disorder    Anxiety Symptoms include nervous/anxious behavior. Patient reports no confusion, decreased concentration, dizziness, nausea, palpitations or suicidal ideas.    Medication Refill Pertinent negatives include no arthralgias, fatigue or nausea.  Depression        Associated symptoms include no decreased concentration, no fatigue and no suicidal ideas.  Past medical history includes anxiety.    Shawnmichael D Bargar presents to the office today for follow-up of several diagnoses and wanted an earlier appointment because he wanted to restart duloxetine.  visit January 16, 2019.  He had wanted a trial off of antidepressants in part because of reading on the Internet about withdrawal symptoms.  He had been off antidepressants for 2 months.  And felt more anxious and depressed.  We discussed it and decided to return to duloxetine and increase to 60 mg daily.  visit in July after being back on the medication of duloxetine 60 mg daily he reported the following: It's great.  No depression but some anxiety that is managed.  He's 9/10 and satisfied.  Pleased with the dosage..  Tolerated well.  Sleep is good.  Better self care and better eating.  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  Energy better. Good appetite.  For years has had thoughts of if got terminal disease it would be easier and they are worse.  Hopelessness resolved. He had wanted to consider tapering off gabapentin and was given permission to do that if he chose to do so. He was continued on lamotrigine 150 mg daily.  visit October 16 2019.   Don't feel depressed and kind of happy but lingering thoughts of "is this as good as it gets?".  Not consuming him.   Not much excitement about things except work.  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  Some anxiety about being around people. Not DT Covid unless it made it worse.  Prefer to be alone.  Did a lot of theater in the past but not excited like he used to be.  Likes yard work.  Not interested in dating.  Wants to talk about Gabapentin for pain.  Previously thought it helped.  Wants to try to stop it and see if he can do without it.  Knees don't hurt bc replaced. Plan:  Cont duloxetine 60 and add Abilify 5 Taper gabapentin off  12/25/19 appt, the following is noted: Anxiety not good.  Weary of dealing with it.  Had trouble dealing with number of people on the beach this weekend DT anxiety.  At times would like a BZ.  Always felt anxious but not managing it well.  Isolating. No SE gabapentin.  Did not stop it bc he forgot it. Plan: Increase gabapentin to 300 BID and 600 HS for 2 days, then incrase to 2 in AM  And HS and 1 in middle of the day for 4 days, then increase to 600 TID.  For a week, if NR then increase to 900  TID.    02/08/2020 appointment with the following noted: Increased gabapentin to 600 mg BID.  Has to nap if takes more.  No less anxious with the increase in dose.  No benefit to it. Asks  about BZ.   Doesn't really want to leave the house and anxiety driving.  Can get anxious even in yard bc afraid of having to talk to people.  No paranoia. Dread of things.  Emotional eating. Plan: Switch to paroxetine 30 mg for better antianxiety effect and hopefully help depression a little more.  Start one half of paroxetine 20 mg tablet and reduce duloxetine to 2 of the 20 mg capsules for 5 days, Then increase paroxetine to 1 tablet daily and reduce duloxetine to 1 capsule daily for 5 days, Then increase to 1-1/2 paroxetine tablets daily and stop duloxetine Continue Abilify 5 mg augmentation for residual anhedonia.   03/08/2020 phone call patient stating he felt Paxil was causing tremors in hands and  feet and not helping with anxiety.  He was instructed to reduce paroxetine to 20 mg daily and stop Abilify for 3 days and then restart Abilify 1/2 tablet. 03/20/2020 phone call patient stating the above changes made no difference in tremor.  He was instructed to stop Abilify and increase paroxetine to 40 mg daily. 04/01/2020 patient called stating his PCP wanted him to stop taking paroxetine completely to see if the tremors would resolve. 05/01/2020 patient phone call stating he was diagnosed with Parkinson's disease but because of anxiety needed to be on some medication he was encouraged to restart the paroxetine which had never had an adequate trial for his anxiety. 06/03/2020 phone call from patient complaining of panic attacks.  He had increase paroxetine to 40 mg a day and was given hydroxyzine as needed.  06/07/2020 urgent appointment with the following noted: Not well.  Depression as bad as ever and anxiety through the roof.  Worse beginning of the week bc feels completely overwhelming.   Getting OOB, brushing teeth is hard, being in public hard. Social avoidance affecting work.  Don't feel scared of PD but slap in the face about age.  Single and in middle of 5 year bankruptcy.  Trouble with concentration. Not suicidal but death thoughts. Yesterday a good day and laughed first time in a while. Poor appetite and lost weight. On paroxetine 40 mg for 3 weeks. No SE.   Trouble staying asleep. 4-5  Hours. Plan: Needs more time to respond to paroxetine 40 mg daily Deplin 15 mg daily for depression  With samples. Increase hydroxyzine for ST anxiety.  07/10/2020 appointment with the following noted: Depression is better but anxiety is through the roof.  Can laugh again.  Less hopeless and paralyzed by depression.  Now 5/10 depression.  Anxiety is still 10/10.  Will have nausea going out.  Has managed the anxiety pretty well.  Wonders if PD is causing anxiety.  Over the last 2-3 years more social anxiety.    No SE with Paxil. Hydroxyzine TID and 2 HS.  Lorazepam helps but manages it. Plan: Increase paroxetine 60 mg daily.  It helped depression but not anxiety so far.  08/06/20 appt with following noted: Cant tell a difference.  Anxiety still up.  BP up with stress.  Takes Ativan prn. Started hydroxyzine 75 HS and sleeping better.  If EMA then hard to get back to sleep.  Not anxious at night typically.  Around people tense and heart races. I don't feel as depressed but no decorations for Xmas for the first time.  Typically after Jan 1 falls into depression and a bit anxious over it. Plan: Continue paroxetine 60 mg daily at least another month.  It helped depression but not anxiety  so far. Increase propranolol to 60-80 mg TID for anxiety  09/05/2020 appointment with following noted: Still awakens terrified of the world and hard to get OOB. Depression is still a bit of a problem. Going anywhere to deal with anyone makes him very anxious to the point of nausea.  When takes lorazepam it eases it off rather than eliminating it. Lorazepam 1 mg avg daily.  Really bad Xmas.  Even to the point of scared to go out with kids.  Once out he does ok usually. SE a little tired.  Not severe.   Hydroxyzine during day and 75 mg at night for sleep and it helps. No libido. Dx PD so can't use risperidone Plan: Increase paroxetine 80 mg daily at least another month.  It helped depression but not anxiety so far.  10/10/2020 appointment with the following noted: Anxiety no better.  Hopeless and "waiting to die".  No joy or relaxation around the corner. Lorazepam helps otherwise shaking and sweating.  Cancelled appts yesterday.  Likes there's something inside the body that's twisting. Plan no med changes.  We will give paroxetine 80 mg another month or so to help.  11/06/2020 appointment with the following noted: I still have pit in stomach but anxiety is better with less shaking sweating, SOB.  Still don't want to go  out much.  Still a lot of worry.  Once home in PM then can let go easier.  Goes to bed at 9 and sleeps good.  Normal function. No change in hopelessness.  No joy or looking forward.  I feel sad and lonely a good part of the time.  Enjoyed football game. Neuro yesterday doing well with early stage PD. Plan: Reduce paroxetine by 50% every 2 weeks down to 20 mg and stay at 20 mg. Start Viibryd 10 mg daily with 350 calories for 1 week then increase to 20 mg daily  12/10/20 appt noted:  Problem with cost Viibryd. Continued with Viibryd 20 mg daily and down to paroxetine 20 mg daily without withdrawal.  The terror of the outside world continues.  Still anxious around most other people and going out. For the first time in a long while,  over the last week, he felt joy and connection and he even made other people laugh.  Had less anxiety around them than usual.  Less hopeless.  Managing work better. Found copay card and last RX $45.   No SE and no withdrawal.  Plan:  paroxetine  stay at 20 mg until the increase in Viibryd can have benefit Increase Viibryd with 350 calories to 40 mg daily DC Deplin due to no response   01/07/2021 appointment with the following noted: The fear of the world remains but significantly less hopeless.  Very anxious over bankruptcy hearing today.  18 mos left on dealing with that. Son graduated from law-school recently.  He got 2 awards.  Left after he walked across the stage.  Couldn't stay in the crowd later.  Ongoing panic with agoraphobia.   Less death thoughts but still some. Works from house and stays there.   Tolerated Viibryd well.   NO SE Needs Klonopin and hydroxyzine. Plan: paroxetine  stay at 20 mg until the increase in Viibryd can have benefit continue Viibryd with 350 calories to 40 mg daily  02/06/21 appt with following noted: Viibryd 40 and paxil 20. Stopped lithium after 3 weeks DT diarrhea and NR. Managing but not great.  Not dramatic improvement but  thinks overall  it is better than with Paxil.  Less depressed than 6 mos ago.  But anxiety over normal activities even in grocery store or waiting room. Working hard.  Managing meetings.   Plan: Reduce Paxil to 10 mg for 2 weeks and stop it Increase Viibryd to 60 mg daily for better anxiety effects  04/03/2021 appointment with following noted: Couldn't afford Viibryd at $200/mo so dropped down to 40 mg daily. More crying on Viibryd even sobbing if not at work.  Feels very alone and hopeless.  Recurring dream disturbing with homesick feelings. Death thoughts without sui intent or plan.  Sleep good. Can do ok around people.  Able to respond to respond to positive stimuli. Plan:  Reduce Viibryd to 1/2 tablet 20 mg and start nortriptyline 25 mg 1 at night for 5 nights then, Continue Viibryd 20 mg daily and increase nortriptyline 2 of the 25 mg capsules for 5 nights, then Stop Viibryd and increase nortriptyline to 3 of the 25 mg capsules nightly. Wait 1 week and get the blood test in the morning. Disc SE  05/19/2021 appointment with the following noted: Cautiously optimistic with change to nortriptyline.  No longer homesick feeling, more confident and less crying.  Still doesn't like going anywhere.  Trying to reduce clonazepam.  Last Saturday 60th BD and really down bc kids didn't come to surprise him..  Lonely. PE last week was good. Handling going out easier.   Been on nortriptyline 75 mg for a month. No full panic lately.  Still leaving house and driving causes anxiety. No SE except 2-3 nights of indigestion. Sleep is good. Plan: Lithium augmentation option to try lower dose 150 bc diarrhea with 300 mg. Yes.  06/18/21 appt noted: Mood depression less severe without crying.  Anxiety still a problem.  Not happy or sad.  Nervous driving. Worrying PD may be worse. Clonazepam 1 mg does not cause sleepiness, dizziness. No particular effect of lihtium 150. Appetite reduced.   Propranolol keeps  BP down. Plan clonidine trial for anxiety  07/30/2021 appt noted: Sees benefit with clonidine 0.1 mg BID.  More at ease.  Still can worry.  Takes clonazepam prn.  Average 1.5 mg daily.  I feel better.  Less hopeless and enjoys things more. Often depression in January. SE constipation and dry mouth. Plan: Reduce propranolol to 40 mg BID bc borderline low BP Continue clonidine 0.1 mg twice daily which was helpful Continue lithium 150 nightly Continue lamotrigine 150 mg daily Continue nortriptyline 75 mg nightly Reduce propranolol to 40 mg twice daily due to 2 borderline low blood pressure Continue Klonopin 1 mg twice daily Try to reduce hydroxyzine to 25 mg 3 times daily and 25-50 nightly Continue gabapentin Discussed referral for [place]  09/04/2021 appointment with the following noted: Covid 10 days ago first time.  Bad first 3 days. Reduced propranolol and lisinopril and now 1/2 tablet lisinopril. Feeling better despite Jan always terrible month for me.   Less fearful going  places but would prefer to be at home.   Plan: reduce hydroxyzine  to just prn 25 mg prn anxiety and it helps And 1-2 hs prn insomnia.  10/06/2021 appointment the following noted: Stopped hyrdroxyzine except prn going out. Lonely but not depressed other than mild seasonal. Terribly constipated using stool softener. Not much appetite and lost 30# over last 3-4 mos.  Not had to work at it. Anxiety is still better and "managed" generally.  Can still hget anxious in situations. Plan: Plan: Plan: No  med changes propranolol per PCP Continue clonidine 0.1 mg twice daily which was helpful Continue lithium 150 nightly Continue lamotrigine 150 mg daily Continue nortriptyline 75 mg nightly Continue Klonopin 1 mg twice daily prn hydroxyzine to 25 mg 3 times daily and 25-50 nightly, use LED Continue gabapentin  12/03/2021 appointment with the following noted: Off gabapentin and reduced hydroxyzine. Stable usually.  More  loneliness than depression.   Still anxious going anywhere but once there is fine. Little appetite and lost 40#. Some low BP lately.  Adjusting dose. Busy with work and a couple of close friends. Able to talk to kids more and longer now. SE dry mouth and ED Plan: no med changes  02/12/22 appt noted: Lost 50# with less appetite than in the past.  At 200# but plateaued. Back pain much better. Off hydroxyzine without problems. A weeks worth of rain and clouds caused depression. Resolved with better weather. Dating.  Lonely and still doesn't love being with people but does so. 3 very close friends.  That's enough.  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 Swamped with work and grateful for it.  10-11 big house projects. Dinner with sons tomorrow.  One is Chief Executive Officer and other retired Company secretary. No panic.  Residual anxiety with people.  Feels safer at home. SE dry mouth and constipation. Plan no med changes  05/18/22 appt noted: Even better, than in 5-10 years. 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 Continued social anxiety and avoids some things. Gradually reducing clonazepam.   SE dry mouth and constipation managed. Appetite is back. And gained back 5# gain. No longer has death thoughts. Dating.  Pleased.  Business is good. Sleep is good.   Pleased with meds. Able to reduce Parkinson's meds. Plan: propranolol per PCP Continue clonidine 0.1 mg twice daily which was helpful for anxiety and BP Continue lithium 150 nightly Continue lamotrigine 150 mg daily Continue nortriptyline 75 mg nightly Continue Klonopin 1 mg twice daily  09/22/22 appt noted: Jan never a good month but 80% better than in the past. Anxiety still there a little but not severe.  Has to push himself to go to [facility] but will do so. Work is good.  Completed chapter 13 bankruptcy and it's behind him. Dating for about a year and it's going well. Has been up to [place]  with her. I feel balanced.  Not on top of the world. Son graduated and starts ICU nurse. PE a month ago. Doesn't think he has Parkinson's.  Not taking tremor meds in 8 mos.   SE dry and constipation managed.    Doing AA by internet.  Sober since 2006.  No history of BZ dependence.   Past Psychiatric Medication Trials:  Brief duloxetine, sertraline 200, paroxetine 80, Viibryd 40, nortriptyline 75 buspirone dizzy, gabapentin,  Lorazepam , clonazepam is better Hydroxyzine  Propranolol 40 BID Lithium 300 diarrhea Lamotrigine 150 Abilify helped the depression and sense of dread but nothing for anxiety. Dx PD so can't use risperidone  Review of Systems:   Review of Systems  Constitutional:  Positive for unexpected weight change. Negative for fatigue.  Cardiovascular:  Negative for palpitations.  Gastrointestinal:  Positive for constipation. Negative for diarrhea and nausea.  Musculoskeletal:  Positive for back pain. Negative for arthralgias.  Neurological:  Negative for dizziness and tremors.  Psychiatric/Behavioral:  Negative for agitation, behavioral problems, confusion, decreased concentration, dysphoric mood, hallucinations, self-injury,  sleep disturbance and suicidal ideas. The patient is nervous/anxious. The patient is not hyperactive.     Medications: I have reviewed the patient's current medications.  Current Outpatient Medications  Medication Sig Dispense Refill   aspirin EC 81 MG tablet Take 1 tablet (81 mg total) by mouth 2 (two) times daily. (Patient taking differently: Take 81 mg by mouth daily.) 60 tablet 0   meloxicam (MOBIC) 15 MG tablet Take 15 mg by mouth daily.     methocarbamol (ROBAXIN) 500 MG tablet Take 500 mg by mouth 3 (three) times daily.     Multiple Vitamins-Minerals (MULTIVITAMIN WITH MINERALS) tablet Take 1 tablet by mouth daily.     omeprazole (PRILOSEC) 20 MG capsule Take 20 mg by mouth 2 (two) times daily.     simvastatin (ZOCOR) 40 MG tablet Take 40  mg by mouth daily.     clonazePAM (KLONOPIN) 1 MG tablet Take 1 tablet (1 mg total) by mouth 2 (two) times daily. 60 tablet 5   cloNIDine (CATAPRES) 0.1 MG tablet TAKE 1 TABLET(0.1 MG) BY MOUTH TWICE DAILY 180 tablet 1   lamoTRIgine (LAMICTAL) 150 MG tablet Take 1 tablet (150 mg total) by mouth 2 (two) times daily. 90 tablet 1   lithium carbonate 150 MG capsule TAKE 1 CAPSULE(150 MG) BY MOUTH DAILY 90 capsule 1   nortriptyline (PAMELOR) 25 MG capsule Take 3 capsules (75 mg total) by mouth at bedtime. 270 capsule 1   propranolol (INDERAL) 40 MG tablet Take 1 tablet (40 mg total) by mouth 2 (two) times daily. 2 daily 180 tablet 1   tadalafil (CIALIS) 20 MG tablet Take 1 tablet (20 mg total) by mouth daily as needed for erectile dysfunction. 30 tablet 3   No current facility-administered medications for this visit.    Medication Side Effects: None  Allergies: No Known Allergies  Past Medical History:  Diagnosis Date   Alcoholism ([place])    sober since 01-2011    Anxiety    Arthritis    Balance problem    Complication of anesthesia    unsuccessful spinal with right knee replacement, used general anesthesia   Depression    Difficulty concentrating    Diverticulosis 2012   GERD (gastroesophageal reflux disease)    Hemorrhoids    Hyperlipidemia    Hypertension    Internal hemorrhoids 2012   Neuromuscular disorder (HCC)    Parkinson   Obsessive compulsive disorder    Osteoarthritis    Rotator cuff tear, left    Tear of right rotator cuff 08/03/2014   Wears contact lenses     Family History  Problem Relation Age of Onset   Stroke Mother    Diabetes Mother    Cancer Father    Parkinson's disease Maternal Grandmother    Colon cancer Neg Hx    Colon polyps Neg Hx    Esophageal cancer Neg Hx    Stomach cancer Neg Hx    Rectal cancer Neg Hx     Social History   Socioeconomic History   Marital status: Divorced    Spouse name: Not on file   Number of children: 3   Years of  education: Not on file   Highest education level: Bachelor's degree (e.g., BA, AB, BS)  Occupational History   Occupation: Investment banker, operational  Tobacco Use   Smoking status: Former    Types: E-cigarettes    Quit date: 08/01/2009    Years since quitting: 13.1   Smokeless tobacco: Never  Vaping Use  Vaping Use: Every day   Start date: 08/12/2014  Substance and Sexual Activity   Alcohol use: No    Comment: not since 2006   Drug use: No    Comment: qut 1996   Sexual activity: Not on file    Comment: uses e-cig  Other Topics Concern   Not on file  Social History Narrative   Lives alone   No caffeine   Social Determinants of Health   Financial Resource Strain: Not on file  Food Insecurity: Not on file  Transportation Needs: Not on file  Physical Activity: Not on file  Stress: Not on file  Social Connections: Not on file  Intimate Partner Violence: Not on file    Past Medical History, Surgical history, Social history, and Family history were reviewed and updated as appropriate.   Please see review of systems for further details on the patient's review from today.   Objective:   Physical Exam:  There were no vitals taken for this visit.  Physical Exam Constitutional:      General: He is not in acute distress. Musculoskeletal:        General: No deformity.  Neurological:     Mental Status: He is alert and oriented to person, place, and time.     Cranial Nerves: No dysarthria.     Coordination: Coordination normal.  Psychiatric:        Attention and Perception: Attention and perception normal. He does not perceive auditory or visual hallucinations.        Mood and Affect: Mood is not anxious or depressed. Affect is not labile, blunt, angry or inappropriate.        Speech: Speech normal.        Behavior: Behavior normal. Behavior is cooperative.        Thought Content: Thought content normal. Thought content is not paranoid or delusional. Thought content does not  include homicidal or suicidal ideation. Thought content does not include suicidal plan.        Cognition and Memory: Cognition and memory normal.        Judgment: Judgment normal.     Comments: Talkative. severe anxiety much better and depression is improved so.    Lab Review:     Component Value Date/Time   NA 136 08/02/2018 0458   K 5.0 08/02/2018 0458   CL 102 08/02/2018 0458   CO2 27 08/02/2018 0458   GLUCOSE 138 (H) 08/02/2018 0458   BUN 26 (H) 08/02/2018 0458   CREATININE 1.10 08/02/2018 0458   CALCIUM 9.0 08/02/2018 0458   PROT 7.6 08/12/2017 1445   ALBUMIN 4.3 08/12/2017 1445   AST 29 08/12/2017 1445   ALT 26 08/12/2017 1445   ALKPHOS 60 08/12/2017 1445   BILITOT 1.0 08/12/2017 1445   GFRNONAA >60 08/02/2018 0458   GFRAA >60 08/02/2018 0458       Component Value Date/Time   WBC 14.6 (H) 08/02/2018 0458   RBC 4.06 (L) 08/02/2018 0458   HGB 12.9 (L) 08/02/2018 0458   HCT 37.8 (L) 08/02/2018 0458   PLT 205 08/02/2018 0458   MCV 93.1 08/02/2018 0458   MCH 31.8 08/02/2018 0458   MCHC 34.1 08/02/2018 0458   RDW 12.9 08/02/2018 0458   LYMPHSABS 1.4 07/26/2018 0954   MONOABS 0.6 07/26/2018 0954   EOSABS 0.2 07/26/2018 0954   BASOSABS 0.0 07/26/2018 0954    No results found for: "POCLITH", "LITHIUM"   No results found for: "PHENYTOIN", "PHENOBARB", "VALPROATE", "CBMZ"  04/23/2021 nortriptyline level 113.  No med change indicated.  On nortriptyline 75 mg daily  .res Assessment: Plan:    [place] was seen today for follow-up, anxiety, depression and post-traumatic stress disorder.  Diagnoses and all orders for this visit:  Moderately severe recurrent major depression (HCC) -     lithium carbonate 150 MG capsule; TAKE 1 CAPSULE(150 MG) BY MOUTH DAILY -     nortriptyline (PAMELOR) 25 MG capsule; Take 3 capsules (75 mg total) by mouth at bedtime.  GAD (generalized anxiety disorder) -     clonazePAM (KLONOPIN) 1 MG tablet; Take 1 tablet (1 mg total) by mouth 2  (two) times daily. -     cloNIDine (CATAPRES) 0.1 MG tablet; TAKE 1 TABLET(0.1 MG) BY MOUTH TWICE DAILY  Panic disorder with agoraphobia -     clonazePAM (KLONOPIN) 1 MG tablet; Take 1 tablet (1 mg total) by mouth 2 (two) times daily. -     cloNIDine (CATAPRES) 0.1 MG tablet; TAKE 1 TABLET(0.1 MG) BY MOUTH TWICE DAILY -     propranolol (INDERAL) 40 MG tablet; Take 1 tablet (40 mg total) by mouth 2 (two) times daily. 2 daily  Social anxiety disorder -     clonazePAM (KLONOPIN) 1 MG tablet; Take 1 tablet (1 mg total) by mouth 2 (two) times daily. -     cloNIDine (CATAPRES) 0.1 MG tablet; TAKE 1 TABLET(0.1 MG) BY MOUTH TWICE DAILY -     propranolol (INDERAL) 40 MG tablet; Take 1 tablet (40 mg total) by mouth 2 (two) times daily. 2 daily  PTSD (post-traumatic stress disorder) -     cloNIDine (CATAPRES) 0.1 MG tablet; TAKE 1 TABLET(0.1 MG) BY MOUTH TWICE DAILY -     propranolol (INDERAL) 40 MG tablet; Take 1 tablet (40 mg total) by mouth 2 (two) times daily. 2 daily  Alcohol dependence, in remission ([place])  Other orders -     lamoTRIgine (LAMICTAL) 150 MG tablet; Take 1 tablet (150 mg total) by mouth 2 (two) times daily. -     tadalafil (CIALIS) 20 MG tablet; Take 1 tablet (20 mg total) by mouth daily as needed for erectile dysfunction.   Greater than 50% of 30 min face to face time with patient was spent on counseling and coordination of care. We discussed Patient stated he has been on antidepressants very long time and wondered what his emotional state would be like off the medication.  This was partly influenced by online patient for him to described problems with withdrawal coming off of SSRIs.  He successfully came off antidepressant without withdrawal symptoms.  But then has had relapse of both depression and anxiety within a few weeks off the antidepressant.  SX have been severe and persistent but is seeing  improvement from nortriptyline which is significant for depression but not much forr  anxiety.  Depression is OK and Anxiety is better markedly with clonidine.  Overall much better than years.  Still would like less anxiety.  Residual social anxiety and not fully confident back.  Clonidine  0.1 mg BID was helpful for anxiety.  Consider lithium, mirtazapine, Remeron, trazodone, Deplin .   Disc at length poss change to [facility] for further improvement.  He doesn't want to risk the change Continue Lithium augmentation 150 bc diarrhea with 300 mg.   Switch to nortriptyline 75 HS led to improvement. 04/23/2021 nortriptyline level 113.  No med change indicated.  On nortriptyline 75 mg daily Disc SE.  Avoid sugar Using fiber and  Dulcolax  Klonopin 1 mg BID  Disc risk triggering alcohol abuse.  He wants it bc feels desperate.  Disc fall risk.  We discussed the short-term risks associated with benzodiazepines including sedation and increased fall risk among others.  Discussed long-term side effect risk including dependence, potential withdrawal symptoms, and the potential eventual dose-related risk of dementia.  But recent studies from 2020 dispute this association between benzodiazepines and dementia risk. Newer studies in 2020 do not support an association with dementia. He doesn't notice SE He needs this mainly to leave the house.  Use LED or stop if possible. hydroxyzine  to just prn 25 mg prn anxiety and it helps And 1-2 hs prn insomnia.  Discussed his chronic thoughts about death. They resolved so far.  He commits to safety and is not having suicidal thoughts.  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   Disc BZ.   Disc CBT ERP for anticipatory anxiety.  Extensive discussion of how Covid has allowed social anxiety to get worse but is functional.   Genesight DT TR sx.  Reviewed results at length.    Option switch and trial of Auvelity but he's not sig depressed with nortriptyline.  Much more stable and normal for the last year.  Support continued sobriety.  Continue  AA.    Rec continue counseling for therapy.  Disc ED meds.  Disc vitamin D in detail.  Vitamin D 5000 units daily until April 1 Extensive discussion about  Constipation management 1.  Lots of water 2.  Powdered fiber supplement such as MiraLAX, Citrucel, etc. preferably with a meal 3.  2 stool softeners a day 4.  Milk of magnesia or magnesium tablets if needed  Disc concerns about polypharmacy which seems unavoidable at present Option lamotrigine increase if needed. Rec it if seasonal or weather worsening mood.  Plan: Plan: No med changes propranolol per PCP Continue clonidine 0.1 mg twice daily which was helpful for anxiety and BP Continue lithium 150 nightly Continue lamotrigine 150 mg daily Continue nortriptyline 75 mg nightly Continue Klonopin 1 mg twice daily  Follow-up 12 weeks  Lynder Parents, MD, DFAPA     Please see After Visit Summary for patient specific instructions.  Future Appointments  Date Time Provider [place]  11/11/2022 11:30 AM Penumalli, Earlean Polka, MD [facility] None    No orders of the defined types were placed in this encounter.      -------------------------------

## 2022-10-19 ENCOUNTER — Other Ambulatory Visit: Payer: Self-pay | Admitting: Psychiatry

## 2022-10-19 DIAGNOSIS — F332 Major depressive disorder, recurrent severe without psychotic features: Secondary | ICD-10-CM

## 2022-11-11 ENCOUNTER — Ambulatory Visit: Payer: 59 | Admitting: Diagnostic Neuroimaging

## 2023-02-10 ENCOUNTER — Other Ambulatory Visit: Payer: Self-pay | Admitting: Psychiatry

## 2023-02-10 DIAGNOSIS — F4310 Post-traumatic stress disorder, unspecified: Secondary | ICD-10-CM

## 2023-02-10 DIAGNOSIS — F4010 Social phobia, unspecified: Secondary | ICD-10-CM

## 2023-02-10 DIAGNOSIS — F4001 Agoraphobia with panic disorder: Secondary | ICD-10-CM

## 2023-03-23 ENCOUNTER — Ambulatory Visit: Payer: 59 | Admitting: Psychiatry

## 2023-03-23 ENCOUNTER — Encounter: Payer: Self-pay | Admitting: Psychiatry

## 2023-03-23 DIAGNOSIS — F411 Generalized anxiety disorder: Secondary | ICD-10-CM

## 2023-03-23 DIAGNOSIS — F1021 Alcohol dependence, in remission: Secondary | ICD-10-CM

## 2023-03-23 DIAGNOSIS — F4001 Agoraphobia with panic disorder: Secondary | ICD-10-CM

## 2023-03-23 DIAGNOSIS — F4010 Social phobia, unspecified: Secondary | ICD-10-CM | POA: Diagnosis not present

## 2023-03-23 DIAGNOSIS — F4310 Post-traumatic stress disorder, unspecified: Secondary | ICD-10-CM

## 2023-03-23 DIAGNOSIS — F332 Major depressive disorder, recurrent severe without psychotic features: Secondary | ICD-10-CM

## 2023-03-23 MED ORDER — NORTRIPTYLINE HCL 75 MG PO CAPS
75.0000 mg | ORAL_CAPSULE | Freq: Every day | ORAL | Status: DC
Start: 2023-03-23 — End: 2023-04-26

## 2023-03-23 NOTE — Progress Notes (Signed)
Adam Harvey [id number] [birth year] 62 y.o.    Subjective:   Patient ID:  Adam Harvey is a 62 y.o. (DOB [birth year]) male.  Chief Complaint:  Chief Complaint  Patient presents with   Follow-up    Mood and anxiety    Anxiety Symptoms include nervous/anxious behavior. Patient reports no confusion, decreased concentration, dizziness, nausea, palpitations or suicidal ideas.    Medication Refill Pertinent negatives include no arthralgias, fatigue or nausea.  Depression        Associated symptoms include no decreased concentration, no fatigue and no suicidal ideas.  Past medical history includes anxiety.    Adam Harvey presents to the office today for follow-up of several diagnoses and wanted an earlier appointment because he wanted to restart duloxetine.  visit January 16, 2019.  He had wanted a trial off of antidepressants in part because of reading on the Internet about withdrawal symptoms.  He had been off antidepressants for 2 months.  And felt more anxious and depressed.  We discussed it and decided to return to duloxetine and increase to 60 mg daily.  visit in July after being back on the medication of duloxetine 60 mg daily he reported the following: It's great.  No depression but some anxiety that is managed.  He's 9/10 and satisfied.  Pleased with the dosage..  Tolerated well.  Sleep is good.  Better self care and better eating.  Working really hard but business is good.  In Holiday representative.  Location manager.  Energy better. Good appetite.  For years has had thoughts of if got terminal disease it would be easier and they are worse.  Hopelessness resolved. He had wanted to consider tapering off gabapentin and was given permission to do that if he chose to do so. He was continued on lamotrigine 150 mg daily.  visit October 16 2019.   Don't feel depressed and kind of happy but lingering thoughts of "is this as good as it gets?".  Not consuming him.  Not much excitement about things  except work.  Doing design work for Teacher, early years/pre.  Some anxiety about being around people. Not DT Covid unless it made it worse.  Prefer to be alone.  Did a lot of theater in the past but not excited like he used to be.  Likes yard work.  Not interested in dating.  Wants to talk about Gabapentin for pain.  Previously thought it helped.  Wants to try to stop it and see if he can do without it.  Knees don't hurt bc replaced. Plan:  Cont duloxetine 60 and add Abilify 5 Taper gabapentin off  12/25/19 appt, the following is noted: Anxiety not good.  Weary of dealing with it.  Had trouble dealing with number of people on the beach this weekend DT anxiety.  At times would like a BZ.  Always felt anxious but not managing it well.  Isolating. No SE gabapentin.  Did not stop it bc he forgot it. Plan: Increase gabapentin to 300 BID and 600 HS for 2 days, then incrase to 2 in AM  And HS and 1 in middle of the day for 4 days, then increase to 600 TID.  For a week, if NR then increase to 900  TID.    02/08/2020 appointment with the following noted: Increased gabapentin to 600 mg BID.  Has to nap if takes more.  No less anxious with the increase in dose.  No benefit to it. Asks about BZ.   Doesn't  really want to leave the house and anxiety driving.  Can get anxious even in yard bc afraid of having to talk to people.  No paranoia. Dread of things.  Emotional eating. Plan: Switch to paroxetine 30 mg for better antianxiety effect and hopefully help depression a little more.  Start one half of paroxetine 20 mg tablet and reduce duloxetine to 2 of the 20 mg capsules for 5 days, Then increase paroxetine to 1 tablet daily and reduce duloxetine to 1 capsule daily for 5 days, Then increase to 1-1/2 paroxetine tablets daily and stop duloxetine Continue Abilify 5 mg augmentation for residual anhedonia.   03/08/2020 phone call patient stating he felt Paxil was causing tremors in hands and feet and not helping with  anxiety.  He was instructed to reduce paroxetine to 20 mg daily and stop Abilify for 3 days and then restart Abilify 1/2 tablet. 03/20/2020 phone call patient stating the above changes made no difference in tremor.  He was instructed to stop Abilify and increase paroxetine to 40 mg daily. 04/01/2020 patient called stating his PCP wanted him to stop taking paroxetine completely to see if the tremors would resolve. 05/01/2020 patient phone call stating he was diagnosed with Parkinson's disease but because of anxiety needed to be on some medication he was encouraged to restart the paroxetine which had never had an adequate trial for his anxiety. 06/03/2020 phone call from patient complaining of panic attacks.  He had increase paroxetine to 40 mg a day and was given hydroxyzine as needed.  06/07/2020 urgent appointment with the following noted: Not well.  Depression as bad as ever and anxiety through the roof.  Worse beginning of the week bc feels completely overwhelming.   Getting OOB, brushing teeth is hard, being in public hard. Social avoidance affecting work.  Don't feel scared of PD but slap in the face about age.  Single and in middle of 5 year bankruptcy.  Trouble with concentration. Not suicidal but death thoughts. Yesterday a good day and laughed first time in a while. Poor appetite and lost weight. On paroxetine 40 mg for 3 weeks. No SE.   Trouble staying asleep. 4-5  Hours. Plan: Needs more time to respond to paroxetine 40 mg daily Deplin 15 mg daily for depression  With samples. Increase hydroxyzine for ST anxiety.  07/10/2020 appointment with the following noted: Depression is better but anxiety is through the roof.  Can laugh again.  Less hopeless and paralyzed by depression.  Now 5/10 depression.  Anxiety is still 10/10.  Will have nausea going out.  Has managed the anxiety pretty well.  Wonders if PD is causing anxiety.  Over the last 2-3 years more social anxiety.   No SE with  Paxil. Hydroxyzine TID and 2 HS.  Lorazepam helps but manages it. Plan: Increase paroxetine 60 mg daily.  It helped depression but not anxiety so far.  08/06/20 appt with following noted: Cant tell a difference.  Anxiety still up.  BP up with stress.  Takes Ativan prn. Started hydroxyzine 75 HS and sleeping better.  If EMA then hard to get back to sleep.  Not anxious at night typically.  Around people tense and heart races. I don't feel as depressed but no decorations for Xmas for the first time.  Typically after Jan 1 falls into depression and a bit anxious over it. Plan: Continue paroxetine 60 mg daily at least another month.  It helped depression but not anxiety so far. Increase propranolol to  60-80 mg TID for anxiety  09/05/2020 appointment with following noted: Still awakens terrified of the world and hard to get OOB. Depression is still a bit of a problem. Going anywhere to deal with anyone makes him very anxious to the point of nausea.  When takes lorazepam it eases it off rather than eliminating it. Lorazepam 1 mg avg daily.  Really bad Xmas.  Even to the point of scared to go out with kids.  Once out he does ok usually. SE a little tired.  Not severe.   Hydroxyzine during day and 75 mg at night for sleep and it helps. No libido. Dx PD so can't use risperidone Plan: Increase paroxetine 80 mg daily at least another month.  It helped depression but not anxiety so far.  10/10/2020 appointment with the following noted: Anxiety no better.  Hopeless and "waiting to die".  No joy or relaxation around the corner. Lorazepam helps otherwise shaking and sweating.  Cancelled appts yesterday.  Likes there's something inside the body that's twisting. Plan no med changes.  We will give paroxetine 80 mg another month or so to help.  11/06/2020 appointment with the following noted: I still have pit in stomach but anxiety is better with less shaking sweating, SOB.  Still don't want to go out much.   Still a lot of worry.  Once home in PM then can let go easier.  Goes to bed at 9 and sleeps good.  Normal function. No change in hopelessness.  No joy or looking forward.  I feel sad and lonely a good part of the time.  Enjoyed football game. Neuro yesterday doing well with early stage PD. Plan: Reduce paroxetine by 50% every 2 weeks down to 20 mg and stay at 20 mg. Start Viibryd 10 mg daily with 350 calories for 1 week then increase to 20 mg daily  12/10/20 appt noted:  Problem with cost Viibryd. Continued with Viibryd 20 mg daily and down to paroxetine 20 mg daily without withdrawal.  The terror of the outside world continues.  Still anxious around most other people and going out. For the first time in a long while,  over the last week, he felt joy and connection and he even made other people laugh.  Had less anxiety around them than usual.  Less hopeless.  Managing work better. Found copay card and last RX $45.   No SE and no withdrawal.  Plan:  paroxetine  stay at 20 mg until the increase in Viibryd can have benefit Increase Viibryd with 350 calories to 40 mg daily DC Deplin due to no response   01/07/2021 appointment with the following noted: The fear of the world remains but significantly less hopeless.  Very anxious over bankruptcy hearing today.  18 mos left on dealing with that. Son graduated from law-school recently.  He got 2 awards.  Left after he walked across the stage.  Couldn't stay in the crowd later.  Ongoing panic with agoraphobia.   Less death thoughts but still some. Works from house and stays there.   Tolerated Viibryd well.   NO SE Needs Klonopin and hydroxyzine. Plan: paroxetine  stay at 20 mg until the increase in Viibryd can have benefit continue Viibryd with 350 calories to 40 mg daily  02/06/21 appt with following noted: Viibryd 40 and paxil 20. Stopped lithium after 3 weeks DT diarrhea and NR. Managing but not great.  Not dramatic improvement but thinks  overall it is better than with  Paxil.  Less depressed than 6 mos ago.  But anxiety over normal activities even in grocery store or waiting room. Working hard.  Managing meetings.   Plan: Reduce Paxil to 10 mg for 2 weeks and stop it Increase Viibryd to 60 mg daily for better anxiety effects  04/03/2021 appointment with following noted: Couldn't afford Viibryd at $200/mo so dropped down to 40 mg daily. More crying on Viibryd even sobbing if not at work.  Feels very alone and hopeless.  Recurring dream disturbing with homesick feelings. Death thoughts without sui intent or plan.  Sleep good. Can do ok around people.  Able to respond to respond to positive stimuli. Plan:  Reduce Viibryd to 1/2 tablet 20 mg and start nortriptyline 25 mg 1 at night for 5 nights then, Continue Viibryd 20 mg daily and increase nortriptyline 2 of the 25 mg capsules for 5 nights, then Stop Viibryd and increase nortriptyline to 3 of the 25 mg capsules nightly. Wait 1 week and get the blood test in the morning. Disc SE  05/19/2021 appointment with the following noted: Cautiously optimistic with change to nortriptyline.  No longer homesick feeling, more confident and less crying.  Still doesn't like going anywhere.  Trying to reduce clonazepam.  Last Saturday 60th BD and really down bc kids didn't come to surprise him..  Lonely. PE last week was good. Handling going out easier.   Been on nortriptyline 75 mg for a month. No full panic lately.  Still leaving house and driving causes anxiety. No SE except 2-3 nights of indigestion. Sleep is good. Plan: Lithium augmentation option to try lower dose 150 bc diarrhea with 300 mg. Yes.  06/18/21 appt noted: Mood depression less severe without crying.  Anxiety still a problem.  Not happy or sad.  Nervous driving. Worrying PD may be worse. Clonazepam 1 mg does not cause sleepiness, dizziness. No particular effect of lihtium 150. Appetite reduced.   Propranolol keeps BP  down. Plan clonidine trial for anxiety  07/30/2021 appt noted: Sees benefit with clonidine 0.1 mg BID.  More at ease.  Still can worry.  Takes clonazepam prn.  Average 1.5 mg daily.  I feel better.  Less hopeless and enjoys things more. Often depression in January. SE constipation and dry mouth. Plan: Reduce propranolol to 40 mg BID bc borderline low BP Continue clonidine 0.1 mg twice daily which was helpful Continue lithium 150 nightly Continue lamotrigine 150 mg daily Continue nortriptyline 75 mg nightly Reduce propranolol to 40 mg twice daily due to 2 borderline low blood pressure Continue Klonopin 1 mg twice daily Try to reduce hydroxyzine to 25 mg 3 times daily and 25-50 nightly Continue gabapentin Discussed referral for TMS  09/04/2021 appointment with the following noted: Covid 10 days ago first time.  Bad first 3 days. Reduced propranolol and lisinopril and now 1/2 tablet lisinopril. Feeling better despite Jan always terrible month for me.   Less fearful going  places but would prefer to be at home.   Plan: reduce hydroxyzine  to just prn 25 mg prn anxiety and it helps And 1-2 hs prn insomnia.  10/06/2021 appointment the following noted: Stopped hyrdroxyzine except prn going out. Lonely but not depressed other than mild seasonal. Terribly constipated using stool softener. Not much appetite and lost 30# over last 3-4 mos.  Not had to work at it. Anxiety is still better and "managed" generally.  Can still hget anxious in situations. Plan: Plan: Plan: No med changes propranolol per PCP  Continue clonidine 0.1 mg twice daily which was helpful Continue lithium 150 nightly Continue lamotrigine 150 mg daily Continue nortriptyline 75 mg nightly Continue Klonopin 1 mg twice daily prn hydroxyzine to 25 mg 3 times daily and 25-50 nightly, use LED Continue gabapentin  12/03/2021 appointment with the following noted: Off gabapentin and reduced hydroxyzine. Stable usually.  More  loneliness than depression.   Still anxious going anywhere but once there is fine. Little appetite and lost 40#. Some low BP lately.  Adjusting dose. Busy with work and a couple of close friends. Able to talk to kids more and longer now. SE dry mouth and ED Plan: no med changes  02/12/22 appt noted: Lost 50# with less appetite than in the past.  At 200# but plateaued. Back pain much better. Off hydroxyzine without problems. A weeks worth of rain and clouds caused depression. Resolved with better weather. Dating.  Lonely and still doesn't love being with people but does so. 3 very close friends.  That's enough.  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 Swamped with work and grateful for it.  10-11 big house projects. Dinner with sons tomorrow.  One is Clinical research associate and other retired Arts development officer. No panic.  Residual anxiety with people.  Feels safer at home. SE dry mouth and constipation. Plan no med changes  05/18/22 appt noted: Even better, than in 5-10 years. 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 Continued social anxiety and avoids some things. Gradually reducing clonazepam.   SE dry mouth and constipation managed. Appetite is back. And gained back 5# gain. No longer has death thoughts. Dating.  Pleased.  Business is good. Sleep is good.   Pleased with meds. Able to reduce Parkinson's meds. Plan: propranolol per PCP Continue clonidine 0.1 mg twice daily which was helpful for anxiety and BP Continue lithium 150 nightly Continue lamotrigine 150 mg daily Continue nortriptyline 75 mg nightly Continue Klonopin 1 mg twice daily  09/22/22 appt noted: Jan never a good month but 80% better than in the past. Anxiety still there a little but not severe.  Has to push himself to go to [employer] but will do so. Work is good.  Completed chapter 13 bankruptcy and it's behind him. Dating for about a year and it's going well. Has been up to [place]  with her. I feel balanced.  Not on top of the world. Son graduated and starts ICU nurse. PE a month ago. Doesn't think he has Parkinson's.  Not taking tremor meds in 8 mos.   SE dry and constipation managed.    03/23/23 appt noted: No med changes Kind of flat but status quo. Some concerns about potential recession.  But he's generally busy. Some anticipatory anxiety but not dep. Superficial dating relationship.  Worries about being alone with age.   Kids well.  Dog doing well. Went to [employer] and enjoyed it.  Clonidine and propranolol helping BP and anxiety  Doing AA by internet.  Sober since 2006.  No history of BZ dependence.   Past Psychiatric Medication Trials:  Brief duloxetine, sertraline 200, paroxetine 80, Viibryd 40, nortriptyline 75 buspirone dizzy, gabapentin,  Lorazepam , clonazepam is better Hydroxyzine  Propranolol 40 BID Lithium 300 diarrhea Lamotrigine 150 Abilify helped the depression and sense of dread but nothing for anxiety. Dx PD so can't use risperidone  Review of Systems:   Review of Systems  Constitutional:  Positive for  unexpected weight change. Negative for fatigue.  Cardiovascular:  Negative for palpitations.  Gastrointestinal:  Positive for constipation. Negative for diarrhea and nausea.  Musculoskeletal:  Positive for back pain. Negative for arthralgias.  Neurological:  Negative for dizziness and tremors.  Psychiatric/Behavioral:  Positive for depression. Negative for agitation, behavioral problems, confusion, decreased concentration, dysphoric mood, hallucinations, self-injury, sleep disturbance and suicidal ideas. The patient is nervous/anxious. The patient is not hyperactive.     Medications: I have reviewed the patient's current medications.  Current Outpatient Medications  Medication Sig Dispense Refill   aspirin EC 81 MG tablet Take 1 tablet (81 mg total) by mouth 2 (two) times daily. (Patient taking differently: Take 81 mg by mouth daily.)  60 tablet 0   clonazePAM (KLONOPIN) 1 MG tablet Take 1 tablet (1 mg total) by mouth 2 (two) times daily. 60 tablet 5   cloNIDine (CATAPRES) 0.1 MG tablet TAKE 1 TABLET(0.1 MG) BY MOUTH TWICE DAILY 180 tablet 1   lamoTRIgine (LAMICTAL) 150 MG tablet Take 1 tablet (150 mg total) by mouth 2 (two) times daily. 90 tablet 1   lithium carbonate 150 MG capsule TAKE 1 CAPSULE(150 MG) BY MOUTH DAILY 90 capsule 1   meloxicam (MOBIC) 15 MG tablet Take 15 mg by mouth daily.     methocarbamol (ROBAXIN) 500 MG tablet Take 500 mg by mouth 3 (three) times daily.     Multiple Vitamins-Minerals (MULTIVITAMIN WITH MINERALS) tablet Take 1 tablet by mouth daily.     nortriptyline (PAMELOR) 25 MG capsule Take 3 capsules (75 mg total) by mouth at bedtime. 270 capsule 1   omeprazole (PRILOSEC) 20 MG capsule Take 20 mg by mouth 2 (two) times daily.     propranolol (INDERAL) 40 MG tablet TAKE 1 TABLET BY MOUTH TWICE DAILY 180 tablet 0   simvastatin (ZOCOR) 40 MG tablet Take 40 mg by mouth daily.     tadalafil (CIALIS) 20 MG tablet Take 1 tablet (20 mg total) by mouth daily as needed for erectile dysfunction. 30 tablet 3   No current facility-administered medications for this visit.    Medication Side Effects: None  Allergies: No Known Allergies  Past Medical History:  Diagnosis Date   Alcoholism (HCC)    sober since 01-2011    Anxiety    Arthritis    Balance problem    Complication of anesthesia    unsuccessful spinal with right knee replacement, used general anesthesia   Depression    Difficulty concentrating    Diverticulosis 2012   GERD (gastroesophageal reflux disease)    Hemorrhoids    Hyperlipidemia    Hypertension    Internal hemorrhoids 2012   Neuromuscular disorder (HCC)    Parkinson   Obsessive compulsive disorder    Osteoarthritis    Rotator cuff tear, left    Tear of right rotator cuff 08/03/2014   Wears contact lenses     Family History  Problem Relation Age of Onset   Stroke Mother     Diabetes Mother    Cancer Father    Parkinson's disease Maternal Grandmother    Colon cancer Neg Hx    Colon polyps Neg Hx    Esophageal cancer Neg Hx    Stomach cancer Neg Hx    Rectal cancer Neg Hx     Social History   Socioeconomic History   Marital status: Divorced    Spouse name: Not on file   Number of children: 3   Years of education: Not on file  Highest education level: Bachelor's degree (e.g., BA, AB, BS)  Occupational History   Occupation: Tree surgeon  Tobacco Use   Smoking status: Former    Types: E-cigarettes    Quit date: 08/01/2009    Years since quitting: 13.6   Smokeless tobacco: Never  Vaping Use   Vaping status: Every Day   Start date: 08/12/2014  Substance and Sexual Activity   Alcohol use: No    Comment: not since 2006   Drug use: No    Comment: qut 1996   Sexual activity: Not on file    Comment: uses e-cig  Other Topics Concern   Not on file  Social History Narrative   Lives alone   No caffeine   Social Determinants of Health   Financial Resource Strain: Not on file  Food Insecurity: Not on file  Transportation Needs: Not on file  Physical Activity: Not on file  Stress: Not on file  Social Connections: Unknown (12/28/2021)   Received from [facility]   Social Network    Social Network: Not on file  Intimate Partner Violence: Unknown (11/19/2021)   Received from [facility]   HITS    Physically Hurt: Not on file    Insult or Talk Down To: Not on file    Threaten Physical Harm: Not on file    Scream or Curse: Not on file    Past Medical History, Surgical history, Social history, and Family history were reviewed and updated as appropriate.   Please see review of systems for further details on the patient's review from today.   Objective:   Physical Exam:  There were no vitals taken for this visit.  Physical Exam Constitutional:      General: He is not in acute distress. Musculoskeletal:        General: No  deformity.  Neurological:     Mental Status: He is alert and oriented to person, place, and time.     Cranial Nerves: No dysarthria.     Coordination: Coordination normal.  Psychiatric:        Attention and Perception: Attention and perception normal. He does not perceive auditory or visual hallucinations.        Mood and Affect: Mood is not anxious or depressed. Affect is not labile, blunt, angry or inappropriate.        Speech: Speech normal.        Behavior: Behavior normal. Behavior is cooperative.        Thought Content: Thought content normal. Thought content is not paranoid or delusional. Thought content does not include homicidal or suicidal ideation. Thought content does not include suicidal plan.        Cognition and Memory: Cognition and memory normal.        Judgment: Judgment normal.     Comments: Talkative. severe anxiety much better and depression is improved so.    Lab Review:     Component Value Date/Time   NA 136 08/02/2018 0458   K 5.0 08/02/2018 0458   CL 102 08/02/2018 0458   CO2 27 08/02/2018 0458   GLUCOSE 138 (H) 08/02/2018 0458   BUN 26 (H) 08/02/2018 0458   CREATININE 1.10 08/02/2018 0458   CALCIUM 9.0 08/02/2018 0458   PROT 7.6 08/12/2017 1445   ALBUMIN 4.3 08/12/2017 1445   AST 29 08/12/2017 1445   ALT 26 08/12/2017 1445   ALKPHOS 60 08/12/2017 1445   BILITOT 1.0 08/12/2017 1445   GFRNONAA >60 08/02/2018 0458   GFRAA >  60 08/02/2018 0458       Component Value Date/Time   WBC 14.6 (H) 08/02/2018 0458   RBC 4.06 (L) 08/02/2018 0458   HGB 12.9 (L) 08/02/2018 0458   HCT 37.8 (L) 08/02/2018 0458   PLT 205 08/02/2018 0458   MCV 93.1 08/02/2018 0458   MCH 31.8 08/02/2018 0458   MCHC 34.1 08/02/2018 0458   RDW 12.9 08/02/2018 0458   LYMPHSABS 1.4 07/26/2018 0954   MONOABS 0.6 07/26/2018 0954   EOSABS 0.2 07/26/2018 0954   BASOSABS 0.0 07/26/2018 0954    No results found for: "POCLITH", "LITHIUM"   No results found for: "PHENYTOIN",  "PHENOBARB", "VALPROATE", "CBMZ"   04/23/2021 nortriptyline level 113.  No med change indicated.  On nortriptyline 75 mg daily  .res Assessment: Plan:    Adam Harvey was seen today for follow-up.  Diagnoses and all orders for this visit:  Moderately severe recurrent major depression (HCC)  GAD (generalized anxiety disorder)  Panic disorder with agoraphobia  Social anxiety disorder  PTSD (post-traumatic stress disorder)  Alcohol dependence, in remission (HCC)    30 min face to face time with patient was spent on counseling and coordination of care. We discussed Patient stated he has been on antidepressants very long time and wondered what his emotional state would be like off the medication.  This was partly influenced by online patient for him to described problems with withdrawal coming off of SSRIs.  He successfully came off antidepressant without withdrawal symptoms.  But then had relapse of both depression and anxiety within a few weeks off the antidepressant.  SX were severe and persistent but then dep  improvement from nortriptyline which is significant but not much forr anxiety.  Depression is OK and Anxiety is better markedly with clonidine.  Overall much better than years.   Residual social anxiety and not fully confident but functions as needed.  Clonidine  0.1 mg BID was helpful for anxiety and BP. Propranolol also helps.  Consider lithium, mirtazapine, Remeron, trazodone, Deplin .   poss change to [facility] for further improvement.  He doesn't want to risk the change Continue Lithium augmentation 150 bc diarrhea with 300 mg.   Switch to nortriptyline 75 HS led to improvement. 04/23/2021 nortriptyline level 113.  No med change indicated.  On nortriptyline 75 mg daily Disc SE.  Avoid sugar Using fiber and Dulcolax  Klonopin 1 mg BID  Disc risk triggering alcohol abuse.  He wants it bc feels desperate.  Disc fall risk.  We discussed the short-term risks associated with  benzodiazepines including sedation and increased fall risk among others.  Discussed long-term side effect risk including dependence, potential withdrawal symptoms, and the potential eventual dose-related risk of dementia.  But recent studies from 2020 dispute this association between benzodiazepines and dementia risk. Newer studies in 2020 do not support an association with dementia. He doesn't notice SE He needs this mainly to leave the house.  Use LED or stop if possible. hydroxyzine  to just prn 25 mg prn anxiety and it helps And 1-2 hs prn insomnia.  Discussed his chronic thoughts about death. They resolved so far.  He commits to safety and is not having suicidal thoughts.  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   Disc BZ.  Some existential issues.    Disc CBT ERP for anticipatory anxiety.  Extensive discussion of how Covid has allowed social anxiety to get worse but is functional.   Genesight DT TR sx.  Reviewed results at length  previously  Support continued sobriety.  Continue AA.    Rec continue counseling for therapy.  Disc ED meds.  Disc vitamin D in detail.  Vitamin D 5000 units daily until April 1 Extensive discussion about  Constipation management 1.  Lots of water 2.  Powdered fiber supplement such as MiraLAX, Citrucel, etc. preferably with a meal 3.  2 stool softeners a day 4.  Milk of magnesia or magnesium tablets if needed  Disc concerns about polypharmacy which seems unavoidable at present Option lamotrigine increase if needed. Rec it if seasonal or weather worsening mood.  Plan: Plan: No med changes indicated propranolol per PCP helps anxiety and BP Continue clonidine 0.1 mg twice daily which was helpful for anxiety and BP Continue lithium 150 nightly Continue lamotrigine 150 mg daily Continue nortriptyline 75 mg nightly Continue Klonopin 1 mg twice daily  Follow-up 4-5 mos  Meredith Staggers, MD, DFAPA     Please see After Visit Summary for  patient specific instructions.  No future appointments.   No orders of the defined types were placed in this encounter.      -------------------------------

## 2023-03-30 ENCOUNTER — Other Ambulatory Visit: Payer: Self-pay | Admitting: Psychiatry

## 2023-03-30 DIAGNOSIS — F4010 Social phobia, unspecified: Secondary | ICD-10-CM

## 2023-03-30 DIAGNOSIS — F4001 Agoraphobia with panic disorder: Secondary | ICD-10-CM

## 2023-03-30 DIAGNOSIS — F411 Generalized anxiety disorder: Secondary | ICD-10-CM

## 2023-04-26 ENCOUNTER — Telehealth: Payer: Self-pay | Admitting: Psychiatry

## 2023-04-26 ENCOUNTER — Other Ambulatory Visit: Payer: Self-pay | Admitting: Psychiatry

## 2023-04-26 DIAGNOSIS — F4310 Post-traumatic stress disorder, unspecified: Secondary | ICD-10-CM

## 2023-04-26 DIAGNOSIS — F332 Major depressive disorder, recurrent severe without psychotic features: Secondary | ICD-10-CM

## 2023-04-26 DIAGNOSIS — F411 Generalized anxiety disorder: Secondary | ICD-10-CM

## 2023-04-26 DIAGNOSIS — F4001 Agoraphobia with panic disorder: Secondary | ICD-10-CM

## 2023-04-26 DIAGNOSIS — F4010 Social phobia, unspecified: Secondary | ICD-10-CM

## 2023-04-26 MED ORDER — NORTRIPTYLINE HCL 75 MG PO CAPS
75.0000 mg | ORAL_CAPSULE | Freq: Every day | ORAL | 4 refills | Status: DC
Start: 2023-04-26 — End: 2023-09-23

## 2023-04-26 NOTE — Telephone Encounter (Signed)
Sent!

## 2023-04-26 NOTE — Telephone Encounter (Signed)
Adam Harvey called in and has run out of nortriptylene 25mg  tabs. He was told you can send in a 75mg  RX taking 1 per day. Please send in the 75mg  RX to [facility] on [address], [place].

## 2023-05-27 ENCOUNTER — Other Ambulatory Visit: Payer: Self-pay | Admitting: Psychiatry

## 2023-06-24 ENCOUNTER — Other Ambulatory Visit: Payer: Self-pay | Admitting: Psychiatry

## 2023-06-24 NOTE — Telephone Encounter (Signed)
Lf 06/22; lv 08/16; nv 02/26 Changed rf to 2.

## 2023-08-26 DIAGNOSIS — M25552 Pain in left hip: Secondary | ICD-10-CM | POA: Diagnosis not present

## 2023-08-26 DIAGNOSIS — M25551 Pain in right hip: Secondary | ICD-10-CM | POA: Diagnosis not present

## 2023-08-26 DIAGNOSIS — M47816 Spondylosis without myelopathy or radiculopathy, lumbar region: Secondary | ICD-10-CM | POA: Diagnosis not present

## 2023-09-05 ENCOUNTER — Ambulatory Visit
Admission: EM | Admit: 2023-09-05 | Discharge: 2023-09-05 | Disposition: A | Discharge status: Home / Self Care | Payer: BC Managed Care – PPO | Attending: Family Medicine | Admitting: Family Medicine

## 2023-09-05 DIAGNOSIS — L03012 Cellulitis of left finger: Secondary | ICD-10-CM | POA: Diagnosis not present

## 2023-09-05 MED ORDER — AMOXICILLIN-POT CLAVULANATE 875-125 MG PO TABS: 1.0000 | ORAL_TABLET | Freq: Two times a day (BID) | ORAL | 0 refills | Status: DC

## 2023-09-05 MED ORDER — HYDROCODONE-ACETAMINOPHEN 5-325 MG PO TABS: 1.0000 | ORAL_TABLET | Freq: Four times a day (QID) | ORAL | 0 refills | Status: DC | PRN

## 2023-09-05 NOTE — Discharge Instructions (Addendum)
You have declined incision and drainage today.  Please start Augmentin to address suspected cellulitis from your paronychia/fingernail infection.  Use warm compresses 3-5 times daily 5 to 10 minutes at a time as your schedule allows.  Please schedule Tylenol at 500 mg - 650 mg once every 6 hours as needed for aches and pains.  If you still have pain despite taking Tylenol regularly, this is breakthrough pain.  You can use hydrocodone once every 6 hours for this.  Once your pain is better controlled, switch back to just Tylenol.

## 2023-09-05 NOTE — ED Triage Notes (Signed)
Pt c/o pain, redness, swelling around left thumb nail area x 3 days-NAD-steady gait

## 2023-09-05 NOTE — ED Provider Notes (Addendum)
[facility]  Note:  This document was prepared using Conservation officer, historic buildings and may include unintentional dictation errors.  MRN: [id number] DOB: Sep 03, 1960  Subjective:   Adam Harvey is a 63 y.o. male presenting for 3-day history of acute onset recurrent left thumb pain with swelling and redness.  Patient states that symptoms started after he was biting his cuticles.  Has a history of paronychias, usually they drain on their own with his personal interventions.  However, they are persisting now.   No current facility-administered medications for this encounter.  Current Outpatient Medications:    aspirin EC 81 MG tablet, Take 1 tablet (81 mg total) by mouth 2 (two) times daily. (Patient taking differently: Take 81 mg by mouth daily.), Disp: 60 tablet, Rfl: 0   clonazePAM (KLONOPIN) 1 MG tablet, TAKE 1 TABLET(1 MG) BY MOUTH TWICE DAILY, Disp: 60 tablet, Rfl: 5   cloNIDine (CATAPRES) 0.1 MG tablet, TAKE 1 TABLET(0.1 MG) BY MOUTH TWICE DAILY, Disp: 180 tablet, Rfl: 1   lamoTRIgine (LAMICTAL) 150 MG tablet, TAKE 1 TABLET(150 MG) BY MOUTH TWICE DAILY, Disp: 90 tablet, Rfl: 2   lithium carbonate 150 MG capsule, TAKE 1 CAPSULE(150 MG) BY MOUTH DAILY, Disp: 90 capsule, Rfl: 1   meloxicam (MOBIC) 15 MG tablet, Take 15 mg by mouth daily., Disp: , Rfl:    methocarbamol (ROBAXIN) 500 MG tablet, Take 500 mg by mouth 3 (three) times daily., Disp: , Rfl:    Multiple Vitamins-Minerals (MULTIVITAMIN WITH MINERALS) tablet, Take 1 tablet by mouth daily., Disp: , Rfl:    nortriptyline (PAMELOR) 75 MG capsule, Take 1 capsule (75 mg total) by mouth at bedtime., Disp: 30 capsule, Rfl: 4   omeprazole (PRILOSEC) 20 MG capsule, Take 20 mg by mouth 2 (two) times daily., Disp: , Rfl:    propranolol (INDERAL) 40 MG tablet, TAKE 1 TABLET BY MOUTH TWICE DAILY, Disp: 180 tablet, Rfl: 0   simvastatin (ZOCOR) 40 MG tablet, Take 40 mg by mouth daily., Disp: , Rfl:    tadalafil  (CIALIS) 20 MG tablet, TAKE 1 TABLET(20 MG) BY MOUTH DAILY AS NEEDED FOR ERECTILE DYSFUNCTION, Disp: 30 tablet, Rfl: 3   No Known Allergies  Past Medical History:  Diagnosis Date   Alcoholism (HCC)    sober since 01-2011    Anxiety    Arthritis    Balance problem    Complication of anesthesia    unsuccessful spinal with right knee replacement, used general anesthesia   Depression    Difficulty concentrating    Diverticulosis 2012   GERD (gastroesophageal reflux disease)    Hemorrhoids    Hyperlipidemia    Hypertension    Internal hemorrhoids 2012   Neuromuscular disorder (HCC)    Parkinson   Obsessive compulsive disorder    Osteoarthritis    Rotator cuff tear, left    Tear of right rotator cuff 08/03/2014   Wears contact lenses      Past Surgical History:  Procedure Laterality Date   ABCESS DRAINAGE  2012   nasal cyst   ACHILLES TENDON REPAIR  2008   right   ARTHOSCOPIC ROTAOR CUFF REPAIR Right 08/03/2014   Procedure: ARTHROSCOPIC ROTATOR CUFF REPAIR;  Surgeon: Eulas Post, MD;  Location:  SURGERY CENTER;  Service: Orthopedics;  Laterality: Right;   COLONOSCOPY  2012   INGUINAL HERNIA REPAIR     right as infant   KNEE ARTHROSCOPY Left    x4   KNEE SURGERY  x3-right   ROTATOR CUFF REPAIR Left 2011    x 2   TONSILLECTOMY     TOTAL KNEE ARTHROPLASTY Right 12/2017   debride scar tissue   TOTAL KNEE ARTHROPLASTY Right 08/20/2017   Procedure: TOTAL KNEE ARTHROPLASTY;  Surgeon: Frederico Hamman, MD;  Location: [facility];  Service: Orthopedics;  Laterality: Right;   TOTAL KNEE ARTHROPLASTY Left 08/01/2018   Procedure: TOTAL KNEE ARTHROPLASTY;  Surgeon: Gean Birchwood, MD;  Location: [facility];  Service: Orthopedics;  Laterality: Left;  Adductor Block   TYMPANOSTOMY TUBE PLACEMENT     as child, several times    Family History  Problem Relation Age of Onset   Stroke Mother    Diabetes Mother    Cancer Father    Parkinson's disease Maternal Grandmother     Colon cancer Neg Hx    Colon polyps Neg Hx    Esophageal cancer Neg Hx    Stomach cancer Neg Hx    Rectal cancer Neg Hx     Social History   Tobacco Use   Smoking status: Former    Types: E-cigarettes, Cigarettes    Quit date: 08/01/2009    Years since quitting: 14.1   Smokeless tobacco: Never  Vaping Use   Vaping status: Every Day   Start date: 08/12/2014  Substance Use Topics   Alcohol use: No    Comment: not since 2006   Drug use: No    ROS   Objective:   Vitals: BP (!) 145/86 (BP Location: Right Arm)   Pulse 69   Temp 97.6 F (36.4 C) (Oral)   Resp 16   SpO2 95%   Physical Exam Constitutional:      General: He is not in acute distress.    Appearance: Normal appearance. He is well-developed and normal weight. He is not ill-appearing, toxic-appearing or diaphoretic.  HENT:     Head: Normocephalic and atraumatic.     Right Ear: External ear normal.     Left Ear: External ear normal.     Nose: Nose normal.     Mouth/Throat:     Pharynx: Oropharynx is clear.  Eyes:     General: No scleral icterus.       Right eye: No discharge.        Left eye: No discharge.     Extraocular Movements: Extraocular movements intact.  Cardiovascular:     Rate and Rhythm: Normal rate.  Pulmonary:     Effort: Pulmonary effort is normal.  Musculoskeletal:       Hands:     Cervical back: Normal range of motion.  Neurological:     Mental Status: He is alert and oriented to person, place, and time.  Psychiatric:        Mood and Affect: Mood normal.        Behavior: Behavior normal.        Thought Content: Thought content normal.        Judgment: Judgment normal.     Assessment and Plan :   I have reviewed the PDMP during this encounter.  1. Cellulitis of left thumb   2. Acute paronychia of left thumb    Patient requested a few doses of hydrocodone and I was agreeable given that he cannot take anti-inflammatories due to his chronic medication.  Offered incision and  drainage but patient declined.  Will use Augmentin to cover for anaerobic organisms.  Recommended using soaks and warm compresses.  Counseled patient on potential for adverse effects  with medications prescribed/recommended today, ER and return-to-clinic precautions discussed, patient verbalized understanding.      Wallis Bamberg, [place] 09/05/23 1610

## 2023-09-09 ENCOUNTER — Other Ambulatory Visit: Payer: Self-pay | Admitting: Psychiatry

## 2023-09-09 DIAGNOSIS — M5416 Radiculopathy, lumbar region: Secondary | ICD-10-CM | POA: Diagnosis not present

## 2023-09-09 DIAGNOSIS — F4001 Agoraphobia with panic disorder: Secondary | ICD-10-CM

## 2023-09-09 DIAGNOSIS — F4010 Social phobia, unspecified: Secondary | ICD-10-CM

## 2023-09-09 DIAGNOSIS — F4310 Post-traumatic stress disorder, unspecified: Secondary | ICD-10-CM

## 2023-09-15 DIAGNOSIS — M5416 Radiculopathy, lumbar region: Secondary | ICD-10-CM | POA: Diagnosis not present

## 2023-09-19 ENCOUNTER — Other Ambulatory Visit: Payer: Self-pay | Admitting: Psychiatry

## 2023-09-19 DIAGNOSIS — F4001 Agoraphobia with panic disorder: Secondary | ICD-10-CM

## 2023-09-19 DIAGNOSIS — F4010 Social phobia, unspecified: Secondary | ICD-10-CM

## 2023-09-19 DIAGNOSIS — F411 Generalized anxiety disorder: Secondary | ICD-10-CM

## 2023-09-20 NOTE — Telephone Encounter (Signed)
Lf 1/3 lv 08/6 nv 2/6

## 2023-09-23 ENCOUNTER — Encounter: Payer: Self-pay | Admitting: Psychiatry

## 2023-09-23 ENCOUNTER — Ambulatory Visit (INDEPENDENT_AMBULATORY_CARE_PROVIDER_SITE_OTHER): Payer: BC Managed Care – PPO | Admitting: Psychiatry

## 2023-09-23 DIAGNOSIS — F4001 Agoraphobia with panic disorder: Secondary | ICD-10-CM | POA: Diagnosis not present

## 2023-09-23 DIAGNOSIS — F4010 Social phobia, unspecified: Secondary | ICD-10-CM

## 2023-09-23 DIAGNOSIS — F411 Generalized anxiety disorder: Secondary | ICD-10-CM | POA: Diagnosis not present

## 2023-09-23 DIAGNOSIS — F332 Major depressive disorder, recurrent severe without psychotic features: Secondary | ICD-10-CM

## 2023-09-23 DIAGNOSIS — F4310 Post-traumatic stress disorder, unspecified: Secondary | ICD-10-CM | POA: Diagnosis not present

## 2023-09-23 DIAGNOSIS — F1021 Alcohol dependence, in remission: Secondary | ICD-10-CM

## 2023-09-23 MED ORDER — LITHIUM CARBONATE 150 MG PO CAPS: ORAL_CAPSULE | ORAL | 1 refills | Status: DC

## 2023-09-23 MED ORDER — CLONAZEPAM 1 MG PO TABS
1.0000 mg | ORAL_TABLET | Freq: Two times a day (BID) | ORAL | 5 refills | Status: DC
Start: 2023-09-23 — End: 2024-03-22

## 2023-09-23 MED ORDER — CLONIDINE HCL 0.1 MG PO TABS: ORAL_TABLET | ORAL | 1 refills | Status: DC

## 2023-09-23 MED ORDER — NORTRIPTYLINE HCL 75 MG PO CAPS: 75.0000 mg | ORAL_CAPSULE | Freq: Every day | ORAL | 1 refills | Status: DC

## 2023-09-23 MED ORDER — LAMOTRIGINE 150 MG PO TABS: 150.0000 mg | ORAL_TABLET | Freq: Every day | ORAL | 1 refills | Status: DC

## 2023-09-23 NOTE — Progress Notes (Signed)
 Adam Harvey [id number] [birth year] 63 y.o.    Subjective:   Patient ID:  Adam Harvey is a 63 y.o. (DOB [birth year]) male.  Chief Complaint:  Chief Complaint  Patient presents with   Follow-up   Depression   Anxiety   Sleeping Problem     Adam Harvey presents to the office today for follow-up of several diagnoses and wanted an earlier appointment because he wanted to restart duloxetine .  visit January 16, 2019.  He had wanted a trial off of antidepressants in part because of reading on the Internet about withdrawal symptoms.  He had been off antidepressants for 2 months.  And felt more anxious and depressed.  We discussed it and decided to return to duloxetine  and increase to 60 mg daily.  visit in July after being back on the medication of duloxetine  60 mg daily he reported the following: It's great.  No depression but some anxiety that is managed.  He's 9/10 and satisfied.  Pleased with the dosage..  Tolerated well.  Sleep is good.  Better self care and better eating.  Working really hard but business is good.  In holiday representative.  Location manager.  Energy better. Good appetite.  For years has had thoughts of if got terminal disease it would be easier and they are worse.  Hopelessness resolved. He had wanted to consider tapering off gabapentin  and was given permission to do that if he chose to do so. He was continued on lamotrigine  150 mg daily.  visit October 16 2019.   Don't feel depressed and kind of happy but lingering thoughts of is this as good as it gets?[name]  Not consuming him.  Not much excitement about things except work.  Doing design work for teacher, early years/pre.  Some anxiety about being around people. Not DT Covid unless it made it worse.  Prefer to be alone.  Did a lot of theater in the past but not excited like he used to be.  Likes yard work.  Not interested in dating.  Wants to talk about Gabapentin  for pain.  Previously thought it helped.  Wants to try to stop it and see if he  can do without it.  Knees don't hurt bc replaced. Plan:  Cont duloxetine  60 and add Abilify  5 Taper gabapentin  off  12/25/19 appt, the following is noted: Anxiety not good.  Weary of dealing with it.  Had trouble dealing with number of people on the beach this weekend DT anxiety.  At times would like a BZ.  Always felt anxious but not managing it well.  Isolating. No SE gabapentin .  Did not stop it bc he forgot it. Plan: Increase gabapentin  to 300 BID and 600 HS for 2 days, then incrase to 2 in AM  And HS and 1 in middle of the day for 4 days, then increase to 600 TID.  For a week, if NR then increase to 900  TID.    02/08/2020 appointment with the following noted: Increased gabapentin  to 600 mg BID.  Has to nap if takes more.  No less anxious with the increase in dose.  No benefit to it. Asks about BZ.   Doesn't really want to leave the house and anxiety driving.  Can get anxious even in yard bc afraid of having to talk to people.  No paranoia. Dread of things.  Emotional eating. Plan: Switch to paroxetine  30 mg for better antianxiety effect and hopefully help depression a little more.  Start one half of  paroxetine  20 mg tablet and reduce duloxetine  to 2 of the 20 mg capsules for 5 days, Then increase paroxetine  to 1 tablet daily and reduce duloxetine  to 1 capsule daily for 5 days, Then increase to 1-1/2 paroxetine  tablets daily and stop duloxetine  Continue Abilify  5 mg augmentation for residual anhedonia.   03/08/2020 phone call patient stating he felt Paxil  was causing tremors in hands and feet and not helping with anxiety.  He was instructed to reduce paroxetine  to 20 mg daily and stop Abilify  for 3 days and then restart Abilify  1/2 tablet. 03/20/2020 phone call patient stating the above changes made no difference in tremor.  He was instructed to stop Abilify  and increase paroxetine  to 40 mg daily. 04/01/2020 patient called stating his PCP wanted him to stop taking paroxetine  completely to see  if the tremors would resolve. 05/01/2020 patient phone call stating he was diagnosed with Parkinson's disease but because of anxiety needed to be on some medication he was encouraged to restart the paroxetine  which had never had an adequate trial for his anxiety. 06/03/2020 phone call from patient complaining of panic attacks.  He had increase paroxetine  to 40 mg a day and was given hydroxyzine  as needed.  06/07/2020 urgent appointment with the following noted: Not well.  Depression as bad as ever and anxiety through the roof.  Worse beginning of the week bc feels completely overwhelming.   Getting OOB, brushing teeth is hard, being in public hard. Social avoidance affecting work.  Don't feel scared of PD but slap in the face about age.  Single and in middle of 5 year bankruptcy.  Trouble with concentration. Not suicidal but death thoughts. Yesterday a good day and laughed first time in a while. Poor appetite and lost weight. On paroxetine  40 mg for 3 weeks. No SE.   Trouble staying asleep. 4-5  Hours. Plan: Needs more time to respond to paroxetine  40 mg daily Deplin 15 mg daily for depression  With samples. Increase hydroxyzine  for ST anxiety.  07/10/2020 appointment with the following noted: Depression is better but anxiety is through the roof.  Can laugh again.  Less hopeless and paralyzed by depression.  Now 5/10 depression.  Anxiety is still 10/10.  Will have nausea going out.  Has managed the anxiety pretty well.  Wonders if PD is causing anxiety.  Over the last 2-3 years more social anxiety.   No SE with Paxil . Hydroxyzine  TID and 2 HS.  Lorazepam  helps but manages it. Plan: Increase paroxetine  60 mg daily.  It helped depression but not anxiety so far.  08/06/20 appt with following noted: Cant tell a difference.  Anxiety still up.  BP up with stress.  Takes Ativan  prn. Started hydroxyzine  75 HS and sleeping better.  If EMA then hard to get back to sleep.  Not anxious at night typically.   Around people tense and heart races. I don't feel as depressed but no decorations for Xmas for the first time.  Typically after Jan 1 falls into depression and a bit anxious over it. Plan: Continue paroxetine  60 mg daily at least another month.  It helped depression but not anxiety so far. Increase propranolol  to 60-80 mg TID for anxiety  09/05/2020 appointment with following noted: Still awakens terrified of the world and hard to get OOB. Depression is still a bit of a problem. Going anywhere to deal with anyone makes him very anxious to the point of nausea.  When takes lorazepam  it eases it off rather than  eliminating it. Lorazepam  1 mg avg daily.  Really bad Xmas.  Even to the point of scared to go out with kids.  Once out he does ok usually. SE a little tired.  Not severe.   Hydroxyzine  during day and 75 mg at night for sleep and it helps. No libido. Dx PD so can't use risperidone Plan: Increase paroxetine  80 mg daily at least another month.  It helped depression but not anxiety so far.  10/10/2020 appointment with the following noted: Anxiety no better.  Hopeless and waiting to die.  No joy or relaxation around the corner. Lorazepam  helps otherwise shaking and sweating.  Cancelled appts yesterday.  Likes there's something inside the body that's twisting. Plan no med changes.  We will give paroxetine  80 mg another month or so to help.  11/06/2020 appointment with the following noted: I still have pit in stomach but anxiety is better with less shaking sweating, SOB.  Still don't want to go out much.  Still a lot of worry.  Once home in PM then can let go easier.  Goes to bed at 9 and sleeps good.  Normal function. No change in hopelessness.  No joy or looking forward.  I feel sad and lonely a good part of the time.  Enjoyed football game. Neuro yesterday doing well with early stage PD. Plan: Reduce paroxetine  by 50% every 2 weeks down to 20 mg and stay at 20 mg. Start Viibryd  10 mg daily  with 350 calories for 1 week then increase to 20 mg daily  12/10/20 appt noted:  Problem with cost Viibryd . Continued with Viibryd  20 mg daily and down to paroxetine  20 mg daily without withdrawal.  The terror of the outside world continues.  Still anxious around most other people and going out. For the first time in a long while,  over the last week, he felt joy and connection and he even made other people laugh.  Had less anxiety around them than usual.  Less hopeless.  Managing work better. Found copay card and last RX $45.   No SE and no withdrawal.  Plan:  paroxetine   stay at 20 mg until the increase in Viibryd  can have benefit Increase Viibryd  with 350 calories to 40 mg daily DC Deplin due to no response   01/07/2021 appointment with the following noted: The fear of the world remains but significantly less hopeless.  Very anxious over bankruptcy hearing today.  18 mos left on dealing with that. Son graduated from law-school recently.  He got 2 awards.  Left after he walked across the stage.  Couldn't stay in the crowd later.  Ongoing panic with agoraphobia.   Less death thoughts but still some. Works from house and stays there.   Tolerated Viibryd  well.   NO SE Needs Klonopin  and hydroxyzine . Plan: paroxetine   stay at 20 mg until the increase in Viibryd  can have benefit continue Viibryd  with 350 calories to 40 mg daily  02/06/21 appt with following noted: Viibryd  40 and paxil  20. Stopped lithium  after 3 weeks DT diarrhea and NR. Managing but not great.  Not dramatic improvement but thinks overall it is better than with Paxil .  Less depressed than 6 mos ago.  But anxiety over normal activities even in grocery store or waiting room. Working hard.  Managing meetings.   Plan: Reduce Paxil  to 10 mg for 2 weeks and stop it Increase Viibryd  to 60 mg daily for better anxiety effects  04/03/2021 appointment with following noted:  Couldn't afford Viibryd  at $200/mo so dropped down to 40 mg  daily. More crying on Viibryd  even sobbing if not at work.  Feels very alone and hopeless.  Recurring dream disturbing with homesick feelings. Death thoughts without sui intent or plan.  Sleep good. Can do ok around people.  Able to respond to respond to positive stimuli. Plan:  Reduce Viibryd  to 1/2 tablet 20 mg and start nortriptyline  25 mg 1 at night for 5 nights then, Continue Viibryd  20 mg daily and increase nortriptyline  2 of the 25 mg capsules for 5 nights, then Stop Viibryd  and increase nortriptyline  to 3 of the 25 mg capsules nightly. Wait 1 week and get the blood test in the morning. Disc SE  05/19/2021 appointment with the following noted: Cautiously optimistic with change to nortriptyline .  No longer homesick feeling, more confident and less crying.  Still doesn't like going anywhere.  Trying to reduce clonazepam .  Last Saturday 60th BD and really down bc kids didn't come to surprise him..  Lonely. PE last week was good. Handling going out easier.   Been on nortriptyline  75 mg for a month. No full panic lately.  Still leaving house and driving causes anxiety. No SE except 2-3 nights of indigestion. Sleep is good. Plan: Lithium  augmentation option to try lower dose 150 bc diarrhea with 300 mg. Yes.  06/18/21 appt noted: Mood depression less severe without crying.  Anxiety still a problem.  Not happy or sad.  Nervous driving. Worrying PD may be worse. Clonazepam  1 mg does not cause sleepiness, dizziness. No particular effect of lihtium 150. Appetite reduced.   Propranolol  keeps BP down. Plan clonidine  trial for anxiety  07/30/2021 appt noted: Sees benefit with clonidine  0.1 mg BID.  More at ease.  Still can worry.  Takes clonazepam  prn.  Average 1.5 mg daily.  I feel better.  Less hopeless and enjoys things more. Often depression in January. SE constipation and dry mouth. Plan: Reduce propranolol  to 40 mg BID bc borderline low BP Continue clonidine  0.1 mg twice daily which  was helpful Continue lithium  150 nightly Continue lamotrigine  150 mg daily Continue nortriptyline  75 mg nightly Reduce propranolol  to 40 mg twice daily due to 2 borderline low blood pressure Continue Klonopin  1 mg twice daily Try to reduce hydroxyzine  to 25 mg 3 times daily and 25-50 nightly Continue gabapentin  Discussed referral for TMS  09/04/2021 appointment with the following noted: Covid 10 days ago first time.  Bad first 3 days. Reduced propranolol  and lisinopril  and now 1/2 tablet lisinopril . Feeling better despite Jan always terrible month for me.   Less fearful going  places but would prefer to be at home.   Plan: reduce hydroxyzine   to just prn 25 mg prn anxiety and it helps And 1-2 hs prn insomnia.  10/06/2021 appointment the following noted: Stopped hyrdroxyzine except prn going out. Lonely but not depressed other than mild seasonal. Terribly constipated using stool softener. Not much appetite and lost 30# over last 3-4 mos.  Not had to work at it. Anxiety is still better and managed generally.  Can still hget anxious in situations. Plan: Plan: Plan: No med changes propranolol  per PCP Continue clonidine  0.1 mg twice daily which was helpful Continue lithium  150 nightly Continue lamotrigine  150 mg daily Continue nortriptyline  75 mg nightly Continue Klonopin  1 mg twice daily prn hydroxyzine  to 25 mg 3 times daily and 25-50 nightly, use LED Continue gabapentin   12/03/2021 appointment with the following noted: Off gabapentin  and reduced  hydroxyzine . Stable usually.  More loneliness than depression.   Still anxious going anywhere but once there is fine. Little appetite and lost 40#. Some low BP lately.  Adjusting dose. Busy with work and a couple of close friends. Able to talk to kids more and longer now. SE dry mouth and ED Plan: no med changes  02/12/22 appt noted: Lost 50# with less appetite than in the past.  At 200# but plateaued. Back pain much better. Off  hydroxyzine  without problems. A weeks worth of rain and clouds caused depression. Resolved with better weather. Dating.  Lonely and still doesn't love being with people but does so. 3 very close friends.  That's enough.  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 Swamped with work and grateful for it.  10-11 big house projects. Dinner with sons tomorrow.  One is clinical research associate and other retired Arts Development Officer. No panic.  Residual anxiety with people.  Feels safer at home. SE dry mouth and constipation. Plan no med changes  05/18/22 appt noted: Even better, than in 5-10 years. 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 Continued social anxiety and avoids some things. Gradually reducing clonazepam .   SE dry mouth and constipation managed. Appetite is back. And gained back 5# gain. No longer has death thoughts. Dating.  Pleased.  Business is good. Sleep is good.   Pleased with meds. Able to reduce Parkinson's meds. Plan: propranolol  per PCP Continue clonidine  0.1 mg twice daily which was helpful for anxiety and BP Continue lithium  150 nightly Continue lamotrigine  150 mg daily Continue nortriptyline  75 mg nightly Continue Klonopin  1 mg twice daily  09/22/22 appt noted: Jan never a good month but 80% better than in the past. Anxiety still there a little but not severe.  Has to push himself to go to [employer] but will do so. Work is good.  Completed chapter 13 bankruptcy and it's behind him. Dating for about a year and it's going well. Has been up to [place] with her. I feel balanced.  Not on top of the world. Son graduated and starts ICU nurse. PE a month ago. Doesn't think he has Parkinson's.  Not taking tremor meds in 8 mos.   SE dry and constipation managed.    03/23/23 appt noted: No med changes Kind of flat but status quo. Some concerns about potential recession.  But he's generally busy. Some anticipatory anxiety but not dep. Superficial  dating relationship.  Worries about being alone with age.   Kids well.  Dog doing well. Went to [employer] and enjoyed it.  Clonidine  and propranolol  helping BP and anxiety Plan no changes  09/23/23 appt noted: Psych med: Clonazepam  1 mg twice daily, clonidine  0.1 mg twice daily, lamotrigine  150 twice daily, lithium  150 nightly, propranolol  40 twice daily, nortriptyline  75 nightly. Without propranolol  BP goes up.   Best Xmas with kids than in 20 years.  Saw [place]. Back pain.  Shot didn't help much.  Stiffness in hands, thighs;  walking is stiff.  Pending neurologist.  He's nervous about it. Close friend with cancer.   Mood and anxiety is pretty stable .  No panic.   Enjoys kids and dating relationship.   No SI anymore but does have death thoughts some. No SE problems.  Doing AA by internet.  Sober since 2006.  No history of BZ dependence.  Past Psychiatric Medication Trials:  Brief duloxetine , sertraline  200, paroxetine   80, Viibryd  40, nortriptyline  75 buspirone dizzy, gabapentin ,  Lorazepam  , clonazepam  is better Hydroxyzine   Propranolol  40 BID Lithium  300 diarrhea Lamotrigine  150 Abilify  helped the depression and sense of dread but nothing for anxiety. Dx PD so can't use risperidone  Review of Systems:   Review of Systems  Constitutional:  Positive for unexpected weight change. Negative for fatigue.  Cardiovascular:  Negative for palpitations.  Gastrointestinal:  Positive for constipation. Negative for diarrhea and nausea.  Musculoskeletal:  Positive for back pain. Negative for arthralgias.  Neurological:  Negative for dizziness and tremors.  Psychiatric/Behavioral:  Negative for agitation, behavioral problems, confusion, decreased concentration, dysphoric mood, hallucinations, self-injury, sleep disturbance and suicidal ideas. The patient is nervous/anxious. The patient is not hyperactive.     Medications: I have reviewed the patient's current medications.  Current Outpatient  Medications  Medication Sig Dispense Refill   aspirin  EC 81 MG tablet Take 1 tablet (81 mg total) by mouth 2 (two) times daily. (Patient taking differently: Take 81 mg by mouth daily.) 60 tablet 0   HYDROcodone -acetaminophen  (NORCO/VICODIN) 5-325 MG tablet Take 1 tablet by mouth every 6 (six) hours as needed for severe pain (pain score 7-10). 3 tablet 0   meloxicam (MOBIC) 15 MG tablet Take 15 mg by mouth daily.     methocarbamol  (ROBAXIN ) 500 MG tablet Take 500 mg by mouth 3 (three) times daily.     Multiple Vitamins-Minerals (MULTIVITAMIN WITH MINERALS) tablet Take 1 tablet by mouth daily.     omeprazole (PRILOSEC) 20 MG capsule Take 20 mg by mouth 2 (two) times daily.     propranolol  (INDERAL ) 40 MG tablet TAKE 1 TABLET BY MOUTH TWICE DAILY 60 tablet 0   simvastatin  (ZOCOR ) 40 MG tablet Take 40 mg by mouth daily.     tadalafil  (CIALIS ) 20 MG tablet TAKE 1 TABLET(20 MG) BY MOUTH DAILY AS NEEDED FOR ERECTILE DYSFUNCTION 30 tablet 3   clonazePAM  (KLONOPIN ) 1 MG tablet Take 1 tablet (1 mg total) by mouth 2 (two) times daily. 60 tablet 5   cloNIDine  (CATAPRES ) 0.1 MG tablet TAKE 1 TABLET(0.1 MG) BY MOUTH TWICE DAILY 180 tablet 1   lamoTRIgine  (LAMICTAL ) 150 MG tablet Take 1 tablet (150 mg total) by mouth daily. 90 tablet 1   lithium  carbonate 150 MG capsule TAKE 1 CAPSULE(150 MG) BY MOUTH DAILY 90 capsule 1   nortriptyline  (PAMELOR ) 75 MG capsule Take 1 capsule (75 mg total) by mouth at bedtime. 90 capsule 1   No current facility-administered medications for this visit.    Medication Side Effects: None  Allergies: No Known Allergies  Past Medical History:  Diagnosis Date   Alcoholism (HCC)    sober since 01-2011    Anxiety    Arthritis    Balance problem    Complication of anesthesia    unsuccessful spinal with right knee replacement, used general anesthesia   Depression    Difficulty concentrating    Diverticulosis 2012   GERD (gastroesophageal reflux disease)    Hemorrhoids     Hyperlipidemia    Hypertension    Internal hemorrhoids 2012   Neuromuscular disorder (HCC)    Parkinson   Obsessive compulsive disorder    Osteoarthritis    Rotator cuff tear, left    Tear of right rotator cuff 08/03/2014   Wears contact lenses     Family History  Problem Relation Age of Onset   Stroke Mother    Diabetes Mother    Cancer Father    Parkinson's disease  Maternal Grandmother    Colon cancer Neg Hx    Colon polyps Neg Hx    Esophageal cancer Neg Hx    Stomach cancer Neg Hx    Rectal cancer Neg Hx     Social History   Socioeconomic History   Marital status: Divorced    Spouse name: Not on file   Number of children: 3   Years of education: Not on file   Highest education level: Bachelor's degree (e.g., BA, AB, BS)  Occupational History   Occupation: Tree Surgeon  Tobacco Use   Smoking status: Former    Types: E-cigarettes, Cigarettes    Quit date: 08/01/2009    Years since quitting: 14.1   Smokeless tobacco: Never  Vaping Use   Vaping status: Every Day   Start date: 08/12/2014  Substance and Sexual Activity   Alcohol use: No    Comment: not since 2006   Drug use: No   Sexual activity: Not on file    Comment: uses e-cig  Other Topics Concern   Not on file  Social History Narrative   Lives alone   No caffeine   Social Drivers of Corporate Investment Banker Strain: Not on file  Food Insecurity: Not on file  Transportation Needs: Not on file  Physical Activity: Not on file  Stress: Not on file  Social Connections: Unknown (12/28/2021)   [facility] from [facility], [facility]   Social Network    Social Network: Not on file  Intimate Partner Violence: Unknown (11/19/2021)   [facility] from [facility], [facility]   HITS    Physically Hurt: Not on file    Insult or Talk Down To: Not on file    Threaten Physical Harm: Not on file    Scream or Curse: Not on file    Past Medical History, Surgical history, Social history, and Family  history were reviewed and updated as appropriate.   Please see review of systems for further details on the patient's review from today.   Objective:   Physical Exam:  There were no vitals taken for this visit.  Physical Exam Constitutional:      General: He is not in acute distress. Musculoskeletal:        General: No deformity.  Neurological:     Mental Status: He is alert and oriented to person, place, and time.     Cranial Nerves: No dysarthria.     Coordination: Coordination normal.  Psychiatric:        Attention and Perception: Attention and perception normal. He does not perceive auditory or visual hallucinations.        Mood and Affect: Mood is not anxious or depressed. Affect is not labile, blunt, angry or inappropriate.        Speech: Speech normal.        Behavior: Behavior normal. Behavior is cooperative.        Thought Content: Thought content normal. Thought content is not paranoid or delusional. Thought content does not include homicidal or suicidal ideation. Thought content does not include suicidal plan.        Cognition and Memory: Cognition and memory normal.        Judgment: Judgment normal.     Comments: Talkative. severe anxiety much better and depression is improved  Gets joy out of things.     Lab Review:     Component Value Date/Time   NA 136 08/02/2018 0458   K 5.0 08/02/2018 0458  CL 102 08/02/2018 0458   CO2 27 08/02/2018 0458   GLUCOSE 138 (H) 08/02/2018 0458   BUN 26 (H) 08/02/2018 0458   CREATININE 1.10 08/02/2018 0458   CALCIUM 9.0 08/02/2018 0458   PROT 7.6 08/12/2017 1445   ALBUMIN 4.3 08/12/2017 1445   AST 29 08/12/2017 1445   ALT 26 08/12/2017 1445   ALKPHOS 60 08/12/2017 1445   BILITOT 1.0 08/12/2017 1445   GFRNONAA >60 08/02/2018 0458   GFRAA >60 08/02/2018 0458       Component Value Date/Time   WBC 14.6 (H) 08/02/2018 0458   RBC 4.06 (L) 08/02/2018 0458   HGB 12.9 (L) 08/02/2018 0458   HCT 37.8 (L) 08/02/2018 0458    PLT 205 08/02/2018 0458   MCV 93.1 08/02/2018 0458   MCH 31.8 08/02/2018 0458   MCHC 34.1 08/02/2018 0458   RDW 12.9 08/02/2018 0458   LYMPHSABS 1.4 07/26/2018 0954   MONOABS 0.6 07/26/2018 0954   EOSABS 0.2 07/26/2018 0954   BASOSABS 0.0 07/26/2018 0954    No results found for: POCLITH, LITHIUM    No results found for: PHENYTOIN, PHENOBARB, VALPROATE, CBMZ   04/23/2021 nortriptyline  level 113.  No med change indicated.  On nortriptyline  75 mg daily  .res Assessment: Plan:    Wilson was seen today for follow-up, depression, anxiety and sleeping problem.  Diagnoses and all orders for this visit:  Moderately severe recurrent major depression (HCC) -     lithium  carbonate 150 MG capsule; TAKE 1 CAPSULE(150 MG) BY MOUTH DAILY -     nortriptyline  (PAMELOR ) 75 MG capsule; Take 1 capsule (75 mg total) by mouth at bedtime.  GAD (generalized anxiety disorder) -     clonazePAM  (KLONOPIN ) 1 MG tablet; Take 1 tablet (1 mg total) by mouth 2 (two) times daily. -     cloNIDine  (CATAPRES ) 0.1 MG tablet; TAKE 1 TABLET(0.1 MG) BY MOUTH TWICE DAILY  Panic disorder with agoraphobia -     clonazePAM  (KLONOPIN ) 1 MG tablet; Take 1 tablet (1 mg total) by mouth 2 (two) times daily. -     cloNIDine  (CATAPRES ) 0.1 MG tablet; TAKE 1 TABLET(0.1 MG) BY MOUTH TWICE DAILY  PTSD (post-traumatic stress disorder) -     cloNIDine  (CATAPRES ) 0.1 MG tablet; TAKE 1 TABLET(0.1 MG) BY MOUTH TWICE DAILY  Social anxiety disorder -     clonazePAM  (KLONOPIN ) 1 MG tablet; Take 1 tablet (1 mg total) by mouth 2 (two) times daily. -     cloNIDine  (CATAPRES ) 0.1 MG tablet; TAKE 1 TABLET(0.1 MG) BY MOUTH TWICE DAILY  Alcohol dependence, in remission (HCC)  Other orders -     lamoTRIgine  (LAMICTAL ) 150 MG tablet; Take 1 tablet (150 mg total) by mouth daily.   30 min face to face time with patient was spent on counseling and coordination of care. We discussed Patient stated he has been on antidepressants very  long time and PREVIOUSLY wondered what his emotional state would be like off the medication.  This was partly influenced by online patient for him to described problems with withdrawal coming off of SSRIs.  He successfully came off antidepressant without withdrawal symptoms.  But then had relapse of both depression and anxiety within a few weeks off the antidepressant.  SX were severe and persistent but then dep AND anxiety have largely resolved with current med regimen.  Depression is OK and Anxiety is better markedly with clonidine .  Overall much better than years.   Residual social anxiety and not fully confident  but functions as needed.  Clonidine   0.1 mg BID was helpful for anxiety and BP. Propranolol  also helps.  Consider lithium , mirtazapine , Remeron , trazodone, Deplin .   poss change to [facility] for further improvement.  He doesn't want to risk the change Continue Lithium  augmentation 150 bc diarrhea with 300 mg.   Switch to nortriptyline  75 HS led to improvement and doing well.   04/23/2021 nortriptyline  level 113.  No med change indicated.  On nortriptyline  75 mg daily Disc SE.  Avoid sugar Using fiber and Dulcolax  Klonopin  1 mg BID  Disc risk triggering alcohol abuse.  He wants it bc feels desperate.  Disc fall risk.  We discussed the short-term risks associated with benzodiazepines including sedation and increased fall risk among others.  Discussed long-term side effect risk including dependence, potential withdrawal symptoms, and the potential eventual dose-related risk of dementia.  But recent studies from 2020 dispute this association between benzodiazepines and dementia risk. Newer studies in 2020 do not support an association with dementia. He doesn't notice SE He needs this mainly to leave the house.  Use LED or stop if possible. hydroxyzine   to just prn 25 mg prn anxiety and it helps And 1-2 hs prn insomnia.  Discussed his chronic thoughts about death. They resolved so far.  He  commits to safety and is not having suicidal thoughts.  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   Disc BZ.  Some existential issues.    Disc CBT dealing with bucket list things.  So much to be grateful for.   Extensive discussion of how Covid has allowed social anxiety to get worse but is functional.   Genesight DT TR sx.  Reviewed results at length previously  Support continued sobriety.  Continue AA.    Rec continue counseling for therapy.  Disc ED meds.  Disc vitamin D in detail.  Vitamin D 5000 units daily until April 1 Extensive discussion about  Constipation management 1.  Lots of water  2.  Powdered fiber supplement such as MiraLAX , Citrucel, etc. preferably with a meal 3.  2 stool softeners a day 4.  Milk of magnesia or magnesium tablets if needed  Disc concerns about polypharmacy which seems unavoidable at present Option lamotrigine  increase if needed. Rec it if seasonal or weather worsening mood.  Plan: Plan: No med changes indicated propranolol  per PCP helps anxiety and BP Continue clonidine  0.1 mg twice daily which was helpful for anxiety and BP Continue lithium  150 nightly Continue lamotrigine  150 mg daily Continue nortriptyline  75 mg nightly Continue Klonopin  1 mg twice daily  Follow-up 4-6 mos  Lorene Macintosh, MD, DFAPA     Please see After Visit Summary for patient specific instructions.  No future appointments.   No orders of the defined types were placed in this encounter.      -------------------------------

## 2023-10-04 DIAGNOSIS — I10 Essential (primary) hypertension: Secondary | ICD-10-CM | POA: Diagnosis not present

## 2023-10-04 DIAGNOSIS — E785 Hyperlipidemia, unspecified: Secondary | ICD-10-CM | POA: Diagnosis not present

## 2023-10-04 DIAGNOSIS — M353 Polymyalgia rheumatica: Secondary | ICD-10-CM | POA: Diagnosis not present

## 2023-10-06 DIAGNOSIS — Z1389 Encounter for screening for other disorder: Secondary | ICD-10-CM | POA: Diagnosis not present

## 2023-10-07 DIAGNOSIS — M5416 Radiculopathy, lumbar region: Secondary | ICD-10-CM | POA: Diagnosis not present

## 2023-10-11 ENCOUNTER — Telehealth: Payer: Self-pay | Admitting: Psychiatry

## 2023-10-11 DIAGNOSIS — F4001 Agoraphobia with panic disorder: Secondary | ICD-10-CM

## 2023-10-11 DIAGNOSIS — F4010 Social phobia, unspecified: Secondary | ICD-10-CM

## 2023-10-11 DIAGNOSIS — F4310 Post-traumatic stress disorder, unspecified: Secondary | ICD-10-CM

## 2023-10-11 MED ORDER — PROPRANOLOL HCL 40 MG PO TABS: 40.0000 mg | ORAL_TABLET | Freq: Two times a day (BID) | ORAL | 0 refills | Status: AC

## 2023-10-11 NOTE — Telephone Encounter (Signed)
 Rx sent for propranolol 40 mg, #180, to [facility] on [address].

## 2023-10-11 NOTE — Telephone Encounter (Signed)
 Next appt is 03/22/24. Requesting refill on Propanolol 40 mg x 90 days. Pharmacy is:    [id number] [place], [place] - [address] AT [facility] [address] [facility] [address]  Phone: [phone]  Fax: [phone]

## 2023-11-10 DIAGNOSIS — I10 Essential (primary) hypertension: Secondary | ICD-10-CM | POA: Diagnosis not present

## 2023-11-10 DIAGNOSIS — M353 Polymyalgia rheumatica: Secondary | ICD-10-CM | POA: Diagnosis not present

## 2023-11-25 DIAGNOSIS — M353 Polymyalgia rheumatica: Secondary | ICD-10-CM | POA: Diagnosis not present

## 2023-11-25 DIAGNOSIS — L821 Other seborrheic keratosis: Secondary | ICD-10-CM | POA: Diagnosis not present

## 2023-11-25 DIAGNOSIS — Z79899 Other long term (current) drug therapy: Secondary | ICD-10-CM | POA: Diagnosis not present

## 2023-11-25 DIAGNOSIS — L570 Actinic keratosis: Secondary | ICD-10-CM | POA: Diagnosis not present

## 2023-11-25 DIAGNOSIS — M2560 Stiffness of unspecified joint, not elsewhere classified: Secondary | ICD-10-CM | POA: Diagnosis not present

## 2023-11-25 DIAGNOSIS — M79642 Pain in left hand: Secondary | ICD-10-CM | POA: Diagnosis not present

## 2023-11-26 ENCOUNTER — Other Ambulatory Visit: Payer: Self-pay | Admitting: Rheumatology

## 2023-11-26 DIAGNOSIS — M353 Polymyalgia rheumatica: Secondary | ICD-10-CM

## 2023-12-02 DIAGNOSIS — M353 Polymyalgia rheumatica: Secondary | ICD-10-CM | POA: Diagnosis not present

## 2023-12-02 DIAGNOSIS — Z79899 Other long term (current) drug therapy: Secondary | ICD-10-CM | POA: Diagnosis not present

## 2024-01-20 DIAGNOSIS — R5383 Other fatigue: Secondary | ICD-10-CM | POA: Diagnosis not present

## 2024-01-20 DIAGNOSIS — M79642 Pain in left hand: Secondary | ICD-10-CM | POA: Diagnosis not present

## 2024-01-20 DIAGNOSIS — M79641 Pain in right hand: Secondary | ICD-10-CM | POA: Diagnosis not present

## 2024-01-20 DIAGNOSIS — M353 Polymyalgia rheumatica: Secondary | ICD-10-CM | POA: Diagnosis not present

## 2024-01-23 DIAGNOSIS — R55 Syncope and collapse: Secondary | ICD-10-CM | POA: Diagnosis not present

## 2024-01-23 DIAGNOSIS — S065X0A Traumatic subdural hemorrhage without loss of consciousness, initial encounter: Secondary | ICD-10-CM | POA: Diagnosis not present

## 2024-01-23 DIAGNOSIS — Z043 Encounter for examination and observation following other accident: Secondary | ICD-10-CM | POA: Diagnosis not present

## 2024-01-23 DIAGNOSIS — G20A1 Parkinson's disease without dyskinesia, without mention of fluctuations: Secondary | ICD-10-CM | POA: Diagnosis not present

## 2024-01-23 DIAGNOSIS — R9431 Abnormal electrocardiogram [ECG] [EKG]: Secondary | ICD-10-CM | POA: Diagnosis not present

## 2024-01-23 DIAGNOSIS — S3991XA Unspecified injury of abdomen, initial encounter: Secondary | ICD-10-CM | POA: Diagnosis not present

## 2024-01-23 DIAGNOSIS — S0990XA Unspecified injury of head, initial encounter: Secondary | ICD-10-CM | POA: Diagnosis not present

## 2024-01-23 DIAGNOSIS — M546 Pain in thoracic spine: Secondary | ICD-10-CM | POA: Diagnosis not present

## 2024-01-23 DIAGNOSIS — W109XXA Fall (on) (from) unspecified stairs and steps, initial encounter: Secondary | ICD-10-CM | POA: Diagnosis not present

## 2024-01-23 DIAGNOSIS — R918 Other nonspecific abnormal finding of lung field: Secondary | ICD-10-CM | POA: Diagnosis not present

## 2024-01-23 DIAGNOSIS — W19XXXA Unspecified fall, initial encounter: Secondary | ICD-10-CM | POA: Diagnosis not present

## 2024-01-23 DIAGNOSIS — S0191XA Laceration without foreign body of unspecified part of head, initial encounter: Secondary | ICD-10-CM | POA: Diagnosis not present

## 2024-01-23 DIAGNOSIS — M5459 Other low back pain: Secondary | ICD-10-CM | POA: Diagnosis not present

## 2024-01-23 DIAGNOSIS — S298XXA Other specified injuries of thorax, initial encounter: Secondary | ICD-10-CM | POA: Diagnosis not present

## 2024-01-23 DIAGNOSIS — Y9301 Activity, walking, marching and hiking: Secondary | ICD-10-CM | POA: Diagnosis not present

## 2024-01-23 DIAGNOSIS — S0101XA Laceration without foreign body of scalp, initial encounter: Secondary | ICD-10-CM | POA: Diagnosis not present

## 2024-01-23 DIAGNOSIS — I6200 Nontraumatic subdural hemorrhage, unspecified: Secondary | ICD-10-CM | POA: Diagnosis not present

## 2024-01-23 DIAGNOSIS — Z23 Encounter for immunization: Secondary | ICD-10-CM | POA: Diagnosis not present

## 2024-01-24 DIAGNOSIS — R55 Syncope and collapse: Secondary | ICD-10-CM | POA: Diagnosis not present

## 2024-01-29 ENCOUNTER — Emergency Department (HOSPITAL_COMMUNITY)

## 2024-01-29 ENCOUNTER — Other Ambulatory Visit: Payer: Self-pay

## 2024-01-29 ENCOUNTER — Emergency Department (HOSPITAL_COMMUNITY)
Admission: EM | Admit: 2024-01-29 | Discharge: 2024-01-30 | Disposition: A | Discharge status: Home / Self Care | Attending: Emergency Medicine | Admitting: Emergency Medicine

## 2024-01-29 ENCOUNTER — Encounter (HOSPITAL_COMMUNITY): Payer: Self-pay

## 2024-01-29 DIAGNOSIS — F0781 Postconcussional syndrome: Secondary | ICD-10-CM | POA: Insufficient documentation

## 2024-01-29 DIAGNOSIS — R42 Dizziness and giddiness: Secondary | ICD-10-CM | POA: Diagnosis not present

## 2024-01-29 DIAGNOSIS — W19XXXA Unspecified fall, initial encounter: Secondary | ICD-10-CM | POA: Diagnosis not present

## 2024-01-29 DIAGNOSIS — I6782 Cerebral ischemia: Secondary | ICD-10-CM | POA: Diagnosis not present

## 2024-01-29 DIAGNOSIS — S0101XA Laceration without foreign body of scalp, initial encounter: Secondary | ICD-10-CM | POA: Diagnosis not present

## 2024-01-29 DIAGNOSIS — R519 Headache, unspecified: Secondary | ICD-10-CM | POA: Diagnosis not present

## 2024-01-29 DIAGNOSIS — R110 Nausea: Secondary | ICD-10-CM | POA: Diagnosis not present

## 2024-01-29 LAB — CBC WITH DIFFERENTIAL/PLATELET
Abs Immature Granulocytes: 0.11 10*3/uL — ABNORMAL HIGH (ref 0.00–0.07)
Basophils Absolute: 0.1 10*3/uL (ref 0.0–0.1)
Basophils Relative: 1 %
Eosinophils Absolute: 0.1 10*3/uL (ref 0.0–0.5)
Eosinophils Relative: 1 %
HCT: 38 % — ABNORMAL LOW (ref 39.0–52.0)
Hemoglobin: 13.2 g/dL (ref 13.0–17.0)
Immature Granulocytes: 1 %
Lymphocytes Relative: 13 %
Lymphs Abs: 1.3 10*3/uL (ref 0.7–4.0)
MCH: 31.5 pg (ref 26.0–34.0)
MCHC: 34.7 g/dL (ref 30.0–36.0)
MCV: 90.7 fL (ref 80.0–100.0)
Monocytes Absolute: 0.6 10*3/uL (ref 0.1–1.0)
Monocytes Relative: 6 %
Neutro Abs: 7.8 10*3/uL — ABNORMAL HIGH (ref 1.7–7.7)
Neutrophils Relative %: 78 %
Platelets: 229 10*3/uL (ref 150–400)
RBC: 4.19 MIL/uL — ABNORMAL LOW (ref 4.22–5.81)
RDW: 13 % (ref 11.5–15.5)
WBC: 10 10*3/uL (ref 4.0–10.5)
nRBC: 0 % (ref 0.0–0.2)

## 2024-01-29 LAB — COMPREHENSIVE METABOLIC PANEL WITH GFR
ALT: 30 U/L (ref 0–44)
AST: 30 U/L (ref 15–41)
Albumin: 3.8 g/dL (ref 3.5–5.0)
Alkaline Phosphatase: 54 U/L (ref 38–126)
Anion gap: 11 (ref 5–15)
BUN: 30 mg/dL — ABNORMAL HIGH (ref 8–23)
CO2: 22 mmol/L (ref 22–32)
Calcium: 9.3 mg/dL (ref 8.9–10.3)
Chloride: 102 mmol/L (ref 98–111)
Creatinine, Ser: 1 mg/dL (ref 0.61–1.24)
GFR, Estimated: >60 mL/min (ref >60)
Glucose, Bld: 113 mg/dL — ABNORMAL HIGH (ref 70–99)
Potassium: 3.8 mmol/L (ref 3.5–5.1)
Sodium: 135 mmol/L (ref 135–145)
Total Bilirubin: 0.7 mg/dL (ref 0.0–1.2)
Total Protein: 7 g/dL (ref 6.5–8.1)

## 2024-01-29 MED ORDER — MECLIZINE HCL 25 MG PO TABS
25.0000 mg | ORAL_TABLET | Freq: Once | ORAL | Status: AC
  Administered 2024-01-29: 25 mg via ORAL
  Filled 2024-01-29: qty 1

## 2024-01-29 MED ORDER — LORAZEPAM 2 MG/ML IJ SOLN
1.0000 mg | Freq: Once | INTRAMUSCULAR | Status: AC | PRN
  Administered 2024-01-29: 1 mg via INTRAVENOUS
  Filled 2024-01-29: qty 1

## 2024-01-29 NOTE — ED Provider Notes (Signed)
 [facility] Emergency Department Provider Note MRN:  [id number]  Arrival date & time: 01/30/24     Chief Complaint   Dizziness   History of Present Illness   Adam Harvey is a 63 y.o. year-old male presents to the ED with chief complaint of dizziness.  States that he fell down about 7 stairs 1 week ago.  He states that he landed directly on his head.  He passed out for about 10 minutes.  States that he had a subdural hematoma.  States that he has been dizzy ever since the fall 1 week ago.  He states that the dizziness only occurs with position changes.  States that tonight at 7:45pm, he felt extremely dizzy that was worse than normal.  States that he wasn't able to walk because he was so dizzy.  States that when he had these symptoms he took (4) 81mg  ASA and took 3 THC gummies. Denies numbness or weakness in his extremities.  Denies vision changes.  States that he has sometimes had a hard time finding his words recently.  History provided by patient.   Review of Systems  Pertinent positive and negative review of systems noted in HPI.    Physical Exam   Vitals:   01/29/24 2139 01/30/24 0042  BP: (!) 141/86 (!) 142/89  Pulse: 75 71  Resp: 18 18  Temp: 98.5 F (36.9 C) 98 F (36.7 C)  SpO2: 99% 100%    CONSTITUTIONAL:  non toxic-appearing, NAD NEURO:  Alert and oriented x 3, CN 3-12 grossly intact EYES:  eyes equal and reactive ENT/NECK:  Supple, no stridor  CARDIO:  normal rate, regular rhythm, appears well-perfused  PULM:  No respiratory distress, CTAB GI/GU:  non-distended,  MSK/SPINE:  No gross deformities, no edema, moves all extremities  SKIN:  no rash, atraumatic   *Additional and/or pertinent findings included in MDM below  Diagnostic and Interventional Summary    EKG Interpretation Date/Time:    Ventricular Rate:    PR Interval:    QRS Duration:    QT Interval:    QTC Calculation:   R Axis:      Text Interpretation:          Labs Reviewed  CBC WITH DIFFERENTIAL/PLATELET - Abnormal; Notable for the following components:      Result Value   RBC 4.19 (*)    HCT 38.0 (*)    Neutro Abs 7.8 (*)    Abs Immature Granulocytes 0.11 (*)    All other components within normal limits  COMPREHENSIVE METABOLIC PANEL WITH GFR - Abnormal; Notable for the following components:   Glucose, Bld 113 (*)    BUN 30 (*)    All other components within normal limits    MR BRAIN WO CONTRAST  Final Result    CT Head Wo Contrast  Final Result      Medications  meclizine (ANTIVERT) tablet 25 mg (25 mg Oral Given 01/29/24 2224)  LORazepam  (ATIVAN ) injection 1 mg (1 mg Intravenous Given 01/29/24 2249)     Procedures  /  Critical Care Procedures  ED Course and Medical Decision Making  I have reviewed the triage vital signs, the nursing notes, and pertinent available records from the EMR.  Social Determinants Affecting Complexity of Care: Patient has no clinically significant social determinants affecting this chief complaint..   ED Course:    Medical Decision Making Patient here with dizziness.  He has had some intermittent episodes of dizziness for the past  week after having a traumatic fall down about 6 stairs.  [facility] and found to have small subdural hematoma.  Called neurosurgery for outpatient visit, but was told that he could follow-up in 1 years time.  Patient states that he came in for repeat CT scan tonight because his dizziness worsened.  CT is unremarkable.  Due to patient's dizziness, I will get an MRI.  Will treat with meclizine.  MRI is also reassuring.  After meclizine, patient is able to ambulate.  He states he feels much better.  His symptoms are likely secondary to postconcussive syndrome.  I will have him follow-up with neurology.  He already has a neurologist with [facility] neurology.  He states that he will call to make an appointment.  I will prescribe him meclizine for his dizziness.  I do  not feel that he needs any further emergent workup at this time.  Amount and/or Complexity of Data Reviewed Labs: ordered. Radiology: ordered.  Risk Prescription drug management.         Consultants: No consultations were needed in caring for this patient.   Treatment and Plan: I considered admission due to patient's initial presentation, but after considering the examination and diagnostic results, patient will not require admission and can be discharged with outpatient follow-up.    Final Clinical Impressions(s) / ED Diagnoses     ICD-10-CM   1. Dizziness  R42     2. Post concussive syndrome  F07.81       ED Discharge Orders          Ordered    meclizine (ANTIVERT) 25 MG tablet  3 times daily PRN        01/30/24 0038              Discharge Instructions Discussed with and Provided to Patient:   Discharge Instructions   None      Sherel Dikes, PA-C 01/30/24 0119    Lowery Rue, DO 01/30/24 1855

## 2024-01-29 NOTE — ED Triage Notes (Addendum)
 Ptg to ED from home with c/o dizziness. Pt had a recent mechanical fall and was diagnosed with a hematoma and a small bleed. Pt started to feel dizzy around 7:45 pm, more than what he has had usually since the fall. Arrives A+O, VSS, NADN.Pt is not on any thinners, negative stroke screen. Pt endorses taking 3 thc gummies this afternoon also.

## 2024-01-30 MED ORDER — MECLIZINE HCL 25 MG PO TABS: 25.0000 mg | ORAL_TABLET | Freq: Three times a day (TID) | ORAL | 0 refills | Status: DC | PRN

## 2024-01-30 MED ORDER — FUROSEMIDE 10 MG/ML IJ SOLN: 40.0000 mg | INTRAMUSCULAR | Status: DC

## 2024-01-31 DIAGNOSIS — I10 Essential (primary) hypertension: Secondary | ICD-10-CM | POA: Diagnosis not present

## 2024-01-31 DIAGNOSIS — R7301 Impaired fasting glucose: Secondary | ICD-10-CM | POA: Diagnosis not present

## 2024-01-31 DIAGNOSIS — M353 Polymyalgia rheumatica: Secondary | ICD-10-CM | POA: Diagnosis not present

## 2024-02-24 ENCOUNTER — Telehealth: Payer: Self-pay | Admitting: Psychiatry

## 2024-02-24 ENCOUNTER — Other Ambulatory Visit: Payer: Self-pay | Admitting: Psychiatry

## 2024-02-24 MED ORDER — TADALAFIL 20 MG PO TABS: ORAL_TABLET | ORAL | 3 refills | Status: AC

## 2024-02-24 NOTE — Telephone Encounter (Signed)
 Rx updated and sent to [facility]

## 2024-02-24 NOTE — Telephone Encounter (Signed)
 Patient would like to get a RF on Cialis  but is wanting to switch to [employer]. Please send it in to [facility] on [address]

## 2024-03-21 ENCOUNTER — Other Ambulatory Visit: Payer: Self-pay | Admitting: Psychiatry

## 2024-03-21 ENCOUNTER — Ambulatory Visit: Payer: Self-pay | Admitting: Diagnostic Neuroimaging

## 2024-03-21 ENCOUNTER — Encounter: Payer: Self-pay | Admitting: Diagnostic Neuroimaging

## 2024-03-21 VITALS — Ht 72.0 in | Wt 232.0 lb

## 2024-03-21 DIAGNOSIS — F332 Major depressive disorder, recurrent severe without psychotic features: Secondary | ICD-10-CM

## 2024-03-21 DIAGNOSIS — F0781 Postconcussional syndrome: Secondary | ICD-10-CM | POA: Diagnosis not present

## 2024-03-21 DIAGNOSIS — G20C Parkinsonism, unspecified: Secondary | ICD-10-CM | POA: Diagnosis not present

## 2024-03-21 NOTE — Patient Instructions (Signed)
  CONCUSSION (June 2025; memory, spatial impairment) - try to stay active physically and get some exercise (at least 15-30 minutes per day) - eat a nutritious diet with lean protein, plants / vegetables, whole grains; avoid ultra-processed foods - increase social activities, brain stimulation, games, puzzles, hobbies, crafts, arts, music; try new activities; keep it fun! - aim for at least 7-8 hours sleep per night (or more) - avoid smoking and alcohol - caution with medications, finances, driving  RESTING TREMOR / RIGIDITY / gait impairment / dysphagia --> (now improved) - check DATscan to rule out parkinsonism - encouraged patient to optimize nutrition and exercise  NUMBNESS IN TOES - check B12, A1c, TSH per PCP - follow up with spine surgery clinic re: cervical / lumbar spine stenosis  PMR (polymyalgia rheumatica) - on prednisone

## 2024-03-21 NOTE — Progress Notes (Signed)
 [facility]  PATIENT: Adam Harvey DOB: 07-23-1961  REFERRING CLINICIAN: Shepard Ade, MD HISTORY FROM: patient  REASON FOR VISIT: follow up  HISTORICAL  CHIEF COMPLAINT:  Chief Complaint  Patient presents with   Neurologic Problem    Rm 7, pt alone. States that 8 wks ago he had a fall and hit the back of head. + LOC and was advised he had post concussive. Was told there was a small bleed on scan but then was never told anything else. States overall  denied headache. He does have concerns and dizziness related but not PT. Was diagnosed with PMR 6 mths ago has been on prednisone and follow up with rheumatology.     HISTORY OF PRESENT ILLNESS:   UPDATE (03/21/24, VRP): Since last visit, doing well until last few months, more falls. Had bad fall down stairs 01/23/24, with concussion. Still with memory, mood and dizziness issues, but improving.   Since last visit tried carb / levo x 6-8 months, and tremors improved, then patient stopped med and tremors stayed away.   UPDATE (11/11/21, VRP): Since last visit, doing well, except some mild balance issues, leaning forward, losing train of thought, more tremor. Tolerating meds. No wearing off.  UPDATE (11/05/2020, VRP): Since last visit, doing better on carbidopa  levodopa , with great improvement tremor control.  Continues to have some issues with anxiety and panic attacks and agoraphobia.  He is working with psychiatry.  He is walking at least 5-6 times per week.  PRIOR HPI (05/01/20): 63 year old right-handed male with hypertension, hypercholesterolemia, depression, anxiety, here for evaluation of tremors.  Patient is noticed 3 to 4 months of intermittent right hand resting tremor.  Also has noted stiffness in muscles, apathy, anxiety, balance difficulty, ringing in ears, restless sleep.  Patient has family history of Parkinson's in his grandmother.  No change in smell or taste.  Had MRI of the brain which was  unremarkable.    REVIEW OF SYSTEMS: Full 14 system review of systems performed and negative with exception of: As per HPI.  ALLERGIES: No Known Allergies  HOME MEDICATIONS: Outpatient Medications Prior to Visit  Medication Sig Dispense Refill   aspirin  EC 81 MG tablet Take 1 tablet (81 mg total) by mouth 2 (two) times daily. 60 tablet 0   clonazePAM  (KLONOPIN ) 1 MG tablet Take 1 tablet (1 mg total) by mouth 2 (two) times daily. 60 tablet 5   cloNIDine  (CATAPRES ) 0.1 MG tablet TAKE 1 TABLET(0.1 MG) BY MOUTH TWICE DAILY 180 tablet 1   lamoTRIgine  (LAMICTAL ) 150 MG tablet Take 1 tablet (150 mg total) by mouth daily. 90 tablet 1   lithium  carbonate 150 MG capsule TAKE 1 CAPSULE(150 MG) BY MOUTH DAILY 90 capsule 1   meclizine  (ANTIVERT ) 25 MG tablet Take 1 tablet (25 mg total) by mouth 3 (three) times daily as needed for dizziness. 30 tablet 0   meloxicam (MOBIC) 15 MG tablet Take 15 mg by mouth daily.     methocarbamol  (ROBAXIN ) 500 MG tablet Take 500 mg by mouth 3 (three) times daily.     Multiple Vitamins-Minerals (MULTIVITAMIN WITH MINERALS) tablet Take 1 tablet by mouth daily.     nortriptyline  (PAMELOR ) 75 MG capsule Take 1 capsule (75 mg total) by mouth at bedtime. 90 capsule 1   omeprazole (PRILOSEC) 20 MG capsule Take 20 mg by mouth 2 (two) times daily.     predniSONE (DELTASONE) 1 MG tablet Take 4 mg by mouth daily. (Patient taking differently: Take 1  mg by mouth daily with breakfast. Takes along with the 5 mg tablet)     predniSONE (DELTASONE) 5 MG tablet Take 5 mg by mouth daily.     propranolol  (INDERAL ) 40 MG tablet Take 1 tablet (40 mg total) by mouth 2 (two) times daily. 180 tablet 0   simvastatin  (ZOCOR ) 40 MG tablet Take 40 mg by mouth daily.     tadalafil  (CIALIS ) 20 MG tablet TAKE 1 TABLET(20 MG) BY MOUTH DAILY AS NEEDED FOR ERECTILE DYSFUNCTION 30 tablet 3   HYDROcodone -acetaminophen  (NORCO/VICODIN) 5-325 MG tablet Take 1 tablet by mouth every 6 (six) hours as needed for  severe pain (pain score 7-10). 3 tablet 0   No facility-administered medications prior to visit.    PAST MEDICAL HISTORY: Past Medical History:  Diagnosis Date   Alcoholism (HCC)    sober since 01-2011    Anxiety    Arthritis    Balance problem    Complication of anesthesia    unsuccessful spinal with right knee replacement, used general anesthesia   Depression    Difficulty concentrating    Diverticulosis 2012   GERD (gastroesophageal reflux disease)    Hemorrhoids    Hyperlipidemia    Hypertension    Internal hemorrhoids 2012   Neuromuscular disorder (HCC)    Parkinson   Obsessive compulsive disorder    Osteoarthritis    Rotator cuff tear, left    Tear of right rotator cuff 08/03/2014   Wears contact lenses     PAST SURGICAL HISTORY: Past Surgical History:  Procedure Laterality Date   ABCESS DRAINAGE  2012   nasal cyst   ACHILLES TENDON REPAIR  2008   right   ARTHOSCOPIC ROTAOR CUFF REPAIR Right 08/03/2014   Procedure: ARTHROSCOPIC ROTATOR CUFF REPAIR;  Surgeon: Fonda SHAUNNA Olmsted, MD;  Location: [facility];  Service: Orthopedics;  Laterality: Right;   COLONOSCOPY  2012   INGUINAL HERNIA REPAIR     right as infant   KNEE ARTHROSCOPY Left    x4   KNEE SURGERY     x3-right   ROTATOR CUFF REPAIR Left 2011    x 2   TONSILLECTOMY     TOTAL KNEE ARTHROPLASTY Right 12/2017   debride scar tissue   TOTAL KNEE ARTHROPLASTY Right 08/20/2017   Procedure: TOTAL KNEE ARTHROPLASTY;  Surgeon: Shari Sieving, MD;  Location: [facility];  Service: Orthopedics;  Laterality: Right;   TOTAL KNEE ARTHROPLASTY Left 08/01/2018   Procedure: TOTAL KNEE ARTHROPLASTY;  Surgeon: Liam Lerner, MD;  Location: [facility];  Service: Orthopedics;  Laterality: Left;  Adductor Block   TYMPANOSTOMY TUBE PLACEMENT     as child, several times    FAMILY HISTORY: Family History  Problem Relation Age of Onset   Stroke Mother    Diabetes Mother    Cancer Father    Parkinson's disease  Maternal Grandmother    Colon cancer Neg Hx    Colon polyps Neg Hx    Esophageal cancer Neg Hx    Stomach cancer Neg Hx    Rectal cancer Neg Hx     SOCIAL HISTORY: Social History   Socioeconomic History   Marital status: Divorced    Spouse name: Not on file   Number of children: 3   Years of education: Not on file   Highest education level: Bachelor's degree (e.g., BA, AB, BS)  Occupational History   Occupation: Tree surgeon  Tobacco Use   Smoking status: Former    Types: E-cigarettes, Cigarettes  Quit date: 08/01/2009    Years since quitting: 14.6   Smokeless tobacco: Never  Vaping Use   Vaping status: Every Day   Start date: 08/12/2014  Substance and Sexual Activity   Alcohol use: No    Comment: not since 2006   Drug use: No   Sexual activity: Not on file    Comment: uses e-cig  Other Topics Concern   Not on file  Social History Narrative   Lives alone   No caffeine   Social Drivers of Corporate investment banker Strain: Not on file  Food Insecurity: Not on file  Transportation Needs: Not on file  Physical Activity: Not on file  Stress: Not on file  Social Connections: Unknown (12/28/2021)   Received [facility]   Social Network    Social Network: Not on file  Intimate Partner Violence: Unknown (11/19/2021)   Received from [facility]   HITS    Physically Hurt: Not on file    Insult or Talk Down To: Not on file    Threaten Physical Harm: Not on file    Scream or Curse: Not on file     PHYSICAL EXAM  GENERAL EXAM/CONSTITUTIONAL: Vitals:  Vitals:   03/21/24 1403  Weight: 232 lb (105.2 kg)  Height: 6' (1.829 m)    Body mass index is 31.46 kg/m. Wt Readings from Last 3 Encounters:  03/21/24 232 lb (105.2 kg)  01/29/24 220 lb 7.4 oz (100 kg)  11/11/21 220 lb (99.8 kg)  Orthostatic VS for the past 24 hrs (Last 3 readings):  BP- Lying Pulse- Lying BP- Standing at 0 minutes Pulse- Standing at 0 minutes BP- Standing at 3 minutes  Pulse- Standing at 3 minutes  03/21/24 1403 151/88 61 131/85 70 (!) 143/92 70    Patient is in no distress; well developed, nourished and groomed; neck is supple  CARDIOVASCULAR: Examination of carotid arteries is normal; no carotid bruits Regular rate and rhythm, no murmurs Examination of peripheral vascular system by observation and palpation is normal  EYES: Ophthalmoscopic exam of optic discs and posterior segments is normal; no papilledema or hemorrhages No results found.  MUSCULOSKELETAL: Gait, strength, tone, movements noted in Neurologic exam below  NEUROLOGIC: MENTAL STATUS:      No data to display         awake, alert, oriented to person, place and time recent and remote memory intact normal attention and concentration language fluent, comprehension intact, naming intact fund of knowledge appropriate  CRANIAL NERVE:  2nd - no papilledema on fundoscopic exam 2nd, 3rd, 4th, 6th - pupils equal and reactive to light, visual fields full to confrontation, extraocular muscles intact, no nystagmus 5th - facial sensation symmetric 7th - facial strength symmetric 8th - hearing intact 9th - palate elevates symmetrically, uvula midline 11th - shoulder shrug symmetric 12th - tongue protrusion midline  MOTOR:  No tremor; no bradykinesia; no rigidity normal bulk and tone, full strength in the BUE, BLE  SENSORY:  normal and symmetric to light touch, temperature, vibration  COORDINATION:  finger-nose-finger, fine finger movements normal  REFLEXES:  deep tendon reflexes TRACE and symmetric  GAIT/STATION:  narrow based gait; SLIGHT DECR RIGHT ARM SWING; SLIGHTLY STOPPED POSTURE     DIAGNOSTIC DATA (LABS, IMAGING, TESTING) - I reviewed patient records, labs, notes, testing and imaging myself where available.  Lab Results  Component Value Date   WBC 10.0 01/29/2024   HGB 13.2 01/29/2024   HCT 38.0 (L) 01/29/2024   MCV  90.7 01/29/2024   PLT 229 01/29/2024       Component Value Date/Time   NA 135 01/29/2024 2136   K 3.8 01/29/2024 2136   CL 102 01/29/2024 2136   CO2 22 01/29/2024 2136   GLUCOSE 113 (H) 01/29/2024 2136   BUN 30 (H) 01/29/2024 2136   CREATININE 1.00 01/29/2024 2136   CALCIUM 9.3 01/29/2024 2136   PROT 7.0 01/29/2024 2136   ALBUMIN 3.8 01/29/2024 2136   AST 30 01/29/2024 2136   ALT 30 01/29/2024 2136   ALKPHOS 54 01/29/2024 2136   BILITOT 0.7 01/29/2024 2136   GFRNONAA >60 01/29/2024 2136   GFRAA >60 08/02/2018 0458   No results found for: CHOL, HDL, LDLCALC, LDLDIRECT, TRIG, CHOLHDL No results found for: YHAJ8R No results found for: VITAMINB12 No results found for: TSH   04/11/20 MRI brain [I reviewed images myself and agree with interpretation. -VRP]  - No acute intracranial abnormality. No findings to explain patient's symptoms.  01/23/24 CT head  1.  Trace amount of acute subdural hemorrhage along the interhemispheric falx.  2.  Subcentimeter extra-axial structure along the right frontal convexity, possibly a small meningioma versus an additional small amount of acute extra-axial hemorrhage.  3.  No significant intracranial mass effect.  4.  Posterior scalp contusions and laceration. No underlying calvarial fracture.   01/23/24 CT cervical spine  Degenerative changes: Posterior disc osteophyte complex at C5-C6 contributes to mild to moderate spinal canal stenosis. Uncovertebral joint spurring at C5-C6 and C6-C7 contributes to mild to moderate bilateral neural foraminal stenosis.   01/23/24 CT lumbar spine Degenerative changes: Multilevel moderate to severe degenerative disc disease in the lumbar spine with vacuum disc phenomenon at multiple levels and multifocal endplate erosions. This contributes to mild to moderate multilevel lumbar spinal canal stenosis. No definite high-grade spinal canal stenosis. Degenerative disc disease in combination with degenerative facet hypertrophy contributes to mild to  moderate multilevel lumbar neural foraminal stenosis.   01/29/24 MRI brain 1. No acute intracranial abnormality. 2. Soft tissue contusion with laceration at the posterior scalp. 3. Mild chronic microvascular ischemic disease for age.   ASSESSMENT AND PLAN  63 y.o. year old male here with:   Dx:  1. Parkinsonism, unspecified Parkinsonism type (HCC)   2. Post concussion syndrome       PLAN:  CONCUSSION (June 2025; memory, spatial impairment) - try to stay active physically and get some exercise (at least 15-30 minutes per day) - eat a nutritious diet with lean protein, plants / vegetables, whole grains; avoid ultra-processed foods - increase social activities, brain stimulation, games, puzzles, hobbies, crafts, arts, music; try new activities; keep it fun! - aim for at least 7-8 hours sleep per night (or more) - avoid smoking and alcohol - caution with medications, finances, driving  RESTING TREMOR / RIGIDITY / gait impairment / dysphagia --> (now improved) - check DATscan to rule out parkinsonism - encouraged patient to optimize nutrition and exercise  NUMBNESS IN TOES - check B12, A1c, TSH per PCP - follow up with spine surgery clinic re: cervical / lumbar spine stenosis  PMR (polymyalgia rheumatica) - on prednisone  Orders Placed This Encounter  Procedures   NM BRAIN DATSCAN TUMOR LOC INFLAM SPECT 1 DAY   Return for return to PCP, pending if symptoms worsen or fail to improve.    EDUARD FABIENE HANLON, MD 03/21/2024, 2:40 PM Certified in Neurology, Neurophysiology and Neuroimaging  [facility] [address], [address] [place], [place] [address] [phone]

## 2024-03-22 ENCOUNTER — Encounter: Payer: Self-pay | Admitting: Psychiatry

## 2024-03-22 ENCOUNTER — Ambulatory Visit: Payer: BC Managed Care – PPO | Admitting: Psychiatry

## 2024-03-22 ENCOUNTER — Telehealth: Payer: Self-pay | Admitting: Diagnostic Neuroimaging

## 2024-03-22 DIAGNOSIS — F4001 Agoraphobia with panic disorder: Secondary | ICD-10-CM

## 2024-03-22 DIAGNOSIS — F4310 Post-traumatic stress disorder, unspecified: Secondary | ICD-10-CM

## 2024-03-22 DIAGNOSIS — F4010 Social phobia, unspecified: Secondary | ICD-10-CM

## 2024-03-22 DIAGNOSIS — F411 Generalized anxiety disorder: Secondary | ICD-10-CM

## 2024-03-22 DIAGNOSIS — F332 Major depressive disorder, recurrent severe without psychotic features: Secondary | ICD-10-CM

## 2024-03-22 DIAGNOSIS — F1021 Alcohol dependence, in remission: Secondary | ICD-10-CM

## 2024-03-22 MED ORDER — LAMOTRIGINE 150 MG PO TABS: 150.0000 mg | ORAL_TABLET | Freq: Every day | ORAL | 1 refills | Status: AC

## 2024-03-22 MED ORDER — CLONIDINE HCL 0.1 MG PO TABS: ORAL_TABLET | ORAL | 1 refills | Status: AC

## 2024-03-22 MED ORDER — CLONAZEPAM 1 MG PO TABS: 1.0000 mg | ORAL_TABLET | Freq: Two times a day (BID) | ORAL | 5 refills | Status: DC

## 2024-03-22 MED ORDER — LITHIUM CARBONATE 150 MG PO CAPS: ORAL_CAPSULE | ORAL | 1 refills | Status: DC

## 2024-03-22 MED ORDER — NORTRIPTYLINE HCL 50 MG PO CAPS
100.0000 mg | ORAL_CAPSULE | Freq: Every day | ORAL | 0 refills | Status: DC
Start: 2024-03-22 — End: 2024-06-19

## 2024-03-22 NOTE — Telephone Encounter (Signed)
 CHARON barrows: [id number] exp. 03/22/24-04/20/24 sent to [facility] [phone]

## 2024-03-22 NOTE — Progress Notes (Signed)
 Adam Harvey [id number] September 25, 1960 63 y.o.    Subjective:   Patient ID:  Adam Harvey is a 63 y.o. (DOB 1961-08-04) male.  Chief Complaint:  Chief Complaint  Patient presents with   Follow-up   Depression   Anxiety     Adam Harvey presents to the office today for follow-up of several diagnoses and wanted an earlier appointment because he wanted to restart duloxetine .  visit January 16, 2019.  He had wanted a trial off of antidepressants in part because of reading on the Internet about withdrawal symptoms.  He had been off antidepressants for 2 months.  And felt more anxious and depressed.  We discussed it and decided to return to duloxetine  and increase to 60 mg daily.  visit in July after being back on the medication of duloxetine  60 mg daily he reported the following: It's great.  No depression but some anxiety that is managed.  He's 9/10 and satisfied.  Pleased with the dosage..  Tolerated well.  Sleep is good.  Better self care and better eating.  Working really hard but business is good.  In Holiday representative.  Location manager.  Energy better. Good appetite.  For years has had thoughts of if got terminal disease it would be easier and they are worse.  Hopelessness resolved. He had wanted to consider tapering off gabapentin  and was given permission to do that if he chose to do so. He was continued on lamotrigine  150 mg daily.  visit October 16 2019.   Don't feel depressed and kind of happy but lingering thoughts of is this as good as it gets?Adam Harvey  Not consuming him.  Not much excitement about things except work.  Doing design work for Teacher, early years/pre.  Some anxiety about being around people. Not DT Covid unless it made it worse.  Prefer to be alone.  Did a lot of theater in the past but not excited like he used to be.  Likes yard work.  Not interested in dating.  Wants to talk about Gabapentin  for pain.  Previously thought it helped.  Wants to try to stop it and see if he can do without it.   Knees don't hurt bc replaced. Plan:  Cont duloxetine  60 and add Abilify  5 Taper gabapentin  off  12/25/19 appt, the following is noted: Anxiety not good.  Weary of dealing with it.  Had trouble dealing with number of people on the beach this weekend DT anxiety.  At times would like a BZ.  Always felt anxious but not managing it well.  Isolating. No SE gabapentin .  Did not stop it bc he forgot it. Plan: Increase gabapentin  to 300 BID and 600 HS for 2 days, then incrase to 2 in AM  And HS and 1 in middle of the day for 4 days, then increase to 600 TID.  For a week, if NR then increase to 900  TID.    02/08/2020 appointment with the following noted: Increased gabapentin  to 600 mg BID.  Has to nap if takes more.  No less anxious with the increase in dose.  No benefit to it. Asks about BZ.   Doesn't really want to leave the house and anxiety driving.  Can get anxious even in yard bc afraid of having to talk to people.  No paranoia. Dread of things.  Emotional eating. Plan: Switch to paroxetine  30 mg for better antianxiety effect and hopefully help depression a little more.  Start one half of paroxetine  20 mg tablet  and reduce duloxetine  to 2 of the 20 mg capsules for 5 days, Then increase paroxetine  to 1 tablet daily and reduce duloxetine  to 1 capsule daily for 5 days, Then increase to 1-1/2 paroxetine  tablets daily and stop duloxetine  Continue Abilify  5 mg augmentation for residual anhedonia.   03/08/2020 phone call patient stating he felt Paxil  was causing tremors in hands and feet and not helping with anxiety.  He was instructed to reduce paroxetine  to 20 mg daily and stop Abilify  for 3 days and then restart Abilify  1/2 tablet. 03/20/2020 phone call patient stating the above changes made no difference in tremor.  He was instructed to stop Abilify  and increase paroxetine  to 40 mg daily. 04/01/2020 patient called stating his PCP wanted him to stop taking paroxetine  completely to see if the tremors would  resolve. 05/01/2020 patient phone call stating he was diagnosed with Parkinson's disease but because of anxiety needed to be on some medication he was encouraged to restart the paroxetine  which had never had an adequate trial for his anxiety. 06/03/2020 phone call from patient complaining of panic attacks.  He had increase paroxetine  to 40 mg a day and was given hydroxyzine  as needed.  06/07/2020 urgent appointment with the following noted: Not well.  Depression as bad as ever and anxiety through the roof.  Worse beginning of the week bc feels completely overwhelming.   Getting OOB, brushing teeth is hard, being in public hard. Social avoidance affecting work.  Don't feel scared of PD but slap in the face about age.  Single and in middle of 5 year bankruptcy.  Trouble with concentration. Not suicidal but death thoughts. Yesterday a good day and laughed first time in a while. Poor appetite and lost weight. On paroxetine  40 mg for 3 weeks. No SE.   Trouble staying asleep. 4-5  Hours. Plan: Needs more time to respond to paroxetine  40 mg daily Deplin 15 mg daily for depression  With samples. Increase hydroxyzine  for ST anxiety.  07/10/2020 appointment with the following noted: Depression is better but anxiety is through the roof.  Can laugh again.  Less hopeless and paralyzed by depression.  Now 5/10 depression.  Anxiety is still 10/10.  Will have nausea going out.  Has managed the anxiety pretty well.  Wonders if PD is causing anxiety.  Over the last 2-3 years more social anxiety.   No SE with Paxil . Hydroxyzine  TID and 2 HS.  Lorazepam  helps but manages it. Plan: Increase paroxetine  60 mg daily.  It helped depression but not anxiety so far.  08/06/20 appt with following noted: Cant tell a difference.  Anxiety still up.  BP up with stress.  Takes Ativan  prn. Started hydroxyzine  75 HS and sleeping better.  If EMA then hard to get back to sleep.  Not anxious at night typically.  Around people tense  and heart races. I don't feel as depressed but no decorations for Xmas for the first time.  Typically after Jan 1 falls into depression and a bit anxious over it. Plan: Continue paroxetine  60 mg daily at least another month.  It helped depression but not anxiety so far. Increase propranolol  to 60-80 mg TID for anxiety  09/05/2020 appointment with following noted: Still awakens terrified of the world and hard to get OOB. Depression is still a bit of a problem. Going anywhere to deal with anyone makes him very anxious to the point of nausea.  When takes lorazepam  it eases it off rather than eliminating it. Lorazepam  1  mg avg daily.  Really bad Xmas.  Even to the point of scared to go out with kids.  Once out he does ok usually. SE a little tired.  Not severe.   Hydroxyzine  during day and 75 mg at night for sleep and it helps. No libido. Dx PD so can't use risperidone Plan: Increase paroxetine  80 mg daily at least another month.  It helped depression but not anxiety so far.  10/10/2020 appointment with the following noted: Anxiety no better.  Hopeless and waiting to die.  No joy or relaxation around the corner. Lorazepam  helps otherwise shaking and sweating.  Cancelled appts yesterday.  Likes there's something inside the body that's twisting. Plan no med changes.  We will give paroxetine  80 mg another month or so to help.  11/06/2020 appointment with the following noted: I still have pit in stomach but anxiety is better with less shaking sweating, SOB.  Still don't want to go out much.  Still a lot of worry.  Once home in PM then can let go easier.  Goes to bed at 9 and sleeps good.  Normal function. No change in hopelessness.  No joy or looking forward.  I feel sad and lonely a good part of the time.  Enjoyed football game. Neuro yesterday doing well with early stage PD. Plan: Reduce paroxetine  by 50% every 2 weeks down to 20 mg and stay at 20 mg. Start Viibryd  10 mg daily with 350 calories  for 1 week then increase to 20 mg daily  12/10/20 appt noted:  Problem with cost Viibryd . Continued with Viibryd  20 mg daily and down to paroxetine  20 mg daily without withdrawal.  The terror of the outside world continues.  Still anxious around most other people and going out. For the first time in a long while,  over the last week, he felt joy and connection and he even made other people laugh.  Had less anxiety around them than usual.  Less hopeless.  Managing work better. Found copay card and last RX $45.   No SE and no withdrawal.  Plan:  paroxetine   stay at 20 mg until the increase in Viibryd  can have benefit Increase Viibryd  with 350 calories to 40 mg daily DC Deplin due to no response   01/07/2021 appointment with the following noted: The fear of the world remains but significantly less hopeless.  Very anxious over bankruptcy hearing today.  18 mos left on dealing with that. Son graduated from law-school recently.  He got 2 awards.  Left after he walked across the stage.  Couldn't stay in the crowd later.  Ongoing panic with agoraphobia.   Less death thoughts but still some. Works from house and stays there.   Tolerated Viibryd  well.   NO SE Needs Klonopin  and hydroxyzine . Plan: paroxetine   stay at 20 mg until the increase in Viibryd  can have benefit continue Viibryd  with 350 calories to 40 mg daily  02/06/21 appt with following noted: Viibryd  40 and paxil  20. Stopped lithium  after 3 weeks DT diarrhea and NR. Managing but not great.  Not dramatic improvement but thinks overall it is better than with Paxil .  Less depressed than 6 mos ago.  But anxiety over normal activities even in grocery store or waiting room. Working hard.  Managing meetings.   Plan: Reduce Paxil  to 10 mg for 2 weeks and stop it Increase Viibryd  to 60 mg daily for better anxiety effects  04/03/2021 appointment with following noted: Couldn't afford Viibryd  at $  200/mo so dropped down to 40 mg daily. More  crying on Viibryd  even sobbing if not at work.  Feels very alone and hopeless.  Recurring dream disturbing with homesick feelings. Death thoughts without sui intent or plan.  Sleep good. Can do ok around people.  Able to respond to respond to positive stimuli. Plan:  Reduce Viibryd  to 1/2 tablet 20 mg and start nortriptyline  25 mg 1 at night for 5 nights then, Continue Viibryd  20 mg daily and increase nortriptyline  2 of the 25 mg capsules for 5 nights, then Stop Viibryd  and increase nortriptyline  to 3 of the 25 mg capsules nightly. Wait 1 week and get the blood test in the morning. Disc SE  05/19/2021 appointment with the following noted: Cautiously optimistic with change to nortriptyline .  No longer homesick feeling, more confident and less crying.  Still doesn't like going anywhere.  Trying to reduce clonazepam .  Last Saturday 60th BD and really down bc kids didn't come to surprise him..  Lonely. PE last week was good. Handling going out easier.   Been on nortriptyline  75 mg for a month. No full panic lately.  Still leaving house and driving causes anxiety. No SE except 2-3 nights of indigestion. Sleep is good. Plan: Lithium  augmentation option to try lower dose 150 bc diarrhea with 300 mg. Yes.  06/18/21 appt noted: Mood depression less severe without crying.  Anxiety still a problem.  Not happy or sad.  Nervous driving. Worrying PD may be worse. Clonazepam  1 mg does not cause sleepiness, dizziness. No particular effect of lihtium 150. Appetite reduced.   Propranolol  keeps BP down. Plan clonidine  trial for anxiety  07/30/2021 appt noted: Sees benefit with clonidine  0.1 mg BID.  More at ease.  Still can worry.  Takes clonazepam  prn.  Average 1.5 mg daily.  I feel better.  Less hopeless and enjoys things more. Often depression in January. SE constipation and dry mouth. Plan: Reduce propranolol  to 40 mg BID bc borderline low BP Continue clonidine  0.1 mg twice daily which was  helpful Continue lithium  150 nightly Continue lamotrigine  150 mg daily Continue nortriptyline  75 mg nightly Reduce propranolol  to 40 mg twice daily due to 2 borderline low blood pressure Continue Klonopin  1 mg twice daily Try to reduce hydroxyzine  to 25 mg 3 times daily and 25-50 nightly Continue gabapentin  Discussed referral for TMS  09/04/2021 appointment with the following noted: Covid 10 days ago first time.  Bad first 3 days. Reduced propranolol  and lisinopril  and now 1/2 tablet lisinopril . Feeling better despite Jan always terrible month for me.   Less fearful going  places but would prefer to be at home.   Plan: reduce hydroxyzine   to just prn 25 mg prn anxiety and it helps And 1-2 hs prn insomnia.  10/06/2021 appointment the following noted: Stopped hyrdroxyzine except prn going out. Lonely but not depressed other than mild seasonal. Terribly constipated using stool softener. Not much appetite and lost 30# over last 3-4 mos.  Not had to work at it. Anxiety is still better and managed generally.  Can still hget anxious in situations. Plan: Plan: Plan: No med changes propranolol  per PCP Continue clonidine  0.1 mg twice daily which was helpful Continue lithium  150 nightly Continue lamotrigine  150 mg daily Continue nortriptyline  75 mg nightly Continue Klonopin  1 mg twice daily prn hydroxyzine  to 25 mg 3 times daily and 25-50 nightly, use LED Continue gabapentin   12/03/2021 appointment with the following noted: Off gabapentin  and reduced hydroxyzine . Stable usually.  More loneliness than depression.   Still anxious going anywhere but once there is fine. Little appetite and lost 40#. Some low BP lately.  Adjusting dose. Busy with work and a couple of close friends. Able to talk to kids more and longer now. SE dry mouth and ED Plan: no med changes  02/12/22 appt noted: Lost 50# with less appetite than in the past.  At 200# but plateaued. Back pain much better. Off  hydroxyzine  without problems. A weeks worth of rain and clouds caused depression. Resolved with better weather. Dating.  Lonely and still doesn't love being with people but does so. 3 very close friends.  That's enough.  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 Swamped with work and grateful for it.  10-11 big house projects. Dinner with sons tomorrow.  One is Clinical research associate and other retired Arts development officer. No panic.  Residual anxiety with people.  Feels safer at home. SE dry mouth and constipation. Plan no med changes  05/18/22 appt noted: Even better, than in 5-10 years. 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 Continued social anxiety and avoids some things. Gradually reducing clonazepam .   SE dry mouth and constipation managed. Appetite is back. And gained back 5# gain. No longer has death thoughts. Dating.  Pleased.  Business is good. Sleep is good.   Pleased with meds. Able to reduce Parkinson's meds. Plan: propranolol  per PCP Continue clonidine  0.1 mg twice daily which was helpful for anxiety and BP Continue lithium  150 nightly Continue lamotrigine  150 mg daily Continue nortriptyline  75 mg nightly Continue Klonopin  1 mg twice daily  09/22/22 appt noted: Jan never a good month but 80% better than in the past. Anxiety still there a little but not severe.  Has to push himself to go to [employer] but will do so. Work is good.  Completed chapter 13 bankruptcy and it's behind him. Dating for about a year and it's going well. Has been up to [place] with her. I feel balanced.  Not on top of the world. Son graduated and starts ICU nurse. PE a month ago. Doesn't think he has Parkinson's.  Not taking tremor meds in 8 mos.   SE dry and constipation managed.    03/23/23 appt noted: No med changes Kind of flat but status quo. Some concerns about potential recession.  But he's generally busy. Some anticipatory anxiety but not dep. Superficial  dating relationship.  Worries about being alone with age.   Kids well.  Dog doing well. Went to [employer] and enjoyed it.  Clonidine  and propranolol  helping BP and anxiety Plan no changes  09/23/23 appt noted: Psych med: Clonazepam  1 mg twice daily, clonidine  0.1 mg twice daily, lamotrigine  150 twice daily, lithium  150 nightly, propranolol  40 twice daily, nortriptyline  75 nightly. Without propranolol  BP goes up.   Best Xmas with kids than in 20 years.  Saw [place]. Back pain.  Shot didn't help much.  Stiffness in hands, thighs;  walking is stiff.  Pending neurologist.  He's nervous about it. Close friend with cancer.   Mood and anxiety is pretty stable .  No panic.   Enjoys kids and dating relationship.   No SI anymore but does have death thoughts some. No SE problems. Plan no med changes  03/22/24 appt noted:  Psych med: Clonazepam  1 mg twice daily, clonidine  0.1 mg twice daily, lamotrigine  150 twice daily, lithium  150 AM, propranolol  40  twice daily, nortriptyline  75 nightly. Interesting 6 mos.  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  Prednisone taper.  Took 10 mg and tapering 1 mg per month and so far ok.   Split up with GF.   8 weeks ago fell down the steps and hit back of head.  LOC 10 mins. Dx post-concussive syndrome and as anxious and dep as I've ever been. Was not dep before the concussion.  Can only work 4-5 hours daily but that is an improvement.  Focus and conc is not good.  Feels very alone.   AA supportive.  Has seen neuro about this. At times don't eat bc scared to go to grocery bc anxiety and seems overwhelming.   More irritable and emotional.  Alone and scared.  Doing AA by internet.  Sober since 2006.  No history of BZ dependence.  Past Psychiatric Medication Trials:  Brief duloxetine , sertraline  200, paroxetine  80, Viibryd  40, nortriptyline  75 buspirone dizzy, gabapentin ,  Lorazepam  , clonazepam  is better Hydroxyzine   Propranolol  40 BID Lithium  300 diarrhea Lamotrigine   150 Abilify  helped the depression and sense of dread but nothing for anxiety. Dx PD so can't use risperidone  Review of Systems:   Review of Systems  Constitutional:  Positive for unexpected weight change. Negative for fatigue.  Cardiovascular:  Negative for palpitations.  Gastrointestinal:  Positive for constipation. Negative for diarrhea and nausea.  Musculoskeletal:  Positive for back pain. Negative for arthralgias.  Neurological:  Negative for dizziness and tremors.  Psychiatric/Behavioral:  Negative for agitation, behavioral problems, confusion, decreased concentration, dysphoric mood, hallucinations, self-injury, sleep disturbance and suicidal ideas. The patient is nervous/anxious. The patient is not hyperactive.     Medications: I have reviewed the patient's current medications.  Current Outpatient Medications  Medication Sig Dispense Refill   aspirin  EC 81 MG tablet Take 1 tablet (81 mg total) by mouth 2 (two) times daily. 60 tablet 0   meclizine  (ANTIVERT ) 25 MG tablet Take 1 tablet (25 mg total) by mouth 3 (three) times daily as needed for dizziness. 30 tablet 0   meloxicam (MOBIC) 15 MG tablet Take 15 mg by mouth daily.     methocarbamol  (ROBAXIN ) 500 MG tablet Take 500 mg by mouth 3 (three) times daily.     Multiple Vitamins-Minerals (MULTIVITAMIN WITH MINERALS) tablet Take 1 tablet by mouth daily.     omeprazole (PRILOSEC) 20 MG capsule Take 20 mg by mouth 2 (two) times daily.     predniSONE (DELTASONE) 1 MG tablet Take 4 mg by mouth daily. (Patient taking differently: Take 1 mg by mouth daily with breakfast. Takes along with the 5 mg tablet)     predniSONE (DELTASONE) 5 MG tablet Take 5 mg by mouth daily.     propranolol  (INDERAL ) 40 MG tablet Take 1 tablet (40 mg total) by mouth 2 (two) times daily. 180 tablet 0   simvastatin  (ZOCOR ) 40 MG tablet Take 40 mg by mouth daily.     tadalafil  (CIALIS ) 20 MG tablet TAKE 1 TABLET(20 MG) BY MOUTH DAILY AS NEEDED FOR ERECTILE  DYSFUNCTION 30 tablet 3   clonazePAM  (KLONOPIN ) 1 MG tablet Take 1 tablet (1 mg total) by mouth 2 (two) times daily. 60 tablet 5   cloNIDine  (CATAPRES ) 0.1 MG tablet TAKE 1 TABLET(0.1 MG) BY MOUTH TWICE DAILY 180 tablet 1   lamoTRIgine  (LAMICTAL ) 150 MG tablet Take 1 tablet (150 mg total) by mouth daily. 90 tablet 1   lithium  carbonate 150 MG capsule TAKE 1 CAPSULE(150  MG) BY MOUTH DAILY 90 capsule 1   nortriptyline  (PAMELOR ) 50 MG capsule Take 2 capsules (100 mg total) by mouth at bedtime. 180 capsule 0   No current facility-administered medications for this visit.    Medication Side Effects: None  Allergies: No Known Allergies  Past Medical History:  Diagnosis Date   Alcoholism (HCC)    sober since 01-2011    Anxiety    Arthritis    Balance problem    Complication of anesthesia    unsuccessful spinal with right knee replacement, used general anesthesia   Depression    Difficulty concentrating    Diverticulosis 2012   GERD (gastroesophageal reflux disease)    Hemorrhoids    Hyperlipidemia    Hypertension    Internal hemorrhoids 2012   Neuromuscular disorder (HCC)    Parkinson   Obsessive compulsive disorder    Osteoarthritis    Rotator cuff tear, left    Tear of right rotator cuff 08/03/2014   Wears contact lenses     Family History  Problem Relation Age of Onset   Stroke Mother    Diabetes Mother    Cancer Father    Parkinson's disease Maternal Grandmother    Colon cancer Neg Hx    Colon polyps Neg Hx    Esophageal cancer Neg Hx    Stomach cancer Neg Hx    Rectal cancer Neg Hx     Social History   Socioeconomic History   Marital status: Divorced    Spouse name: Not on file   Number of children: 3   Years of education: Not on file   Highest education level: Bachelor's degree (e.g., BA, AB, BS)  Occupational History   Occupation: Tree surgeon  Tobacco Use   Smoking status: Former    Types: E-cigarettes, Cigarettes    Quit date: 08/01/2009    Years  since quitting: 14.6   Smokeless tobacco: Never  Vaping Use   Vaping status: Every Day   Start date: 08/12/2014  Substance and Sexual Activity   Alcohol use: No    Comment: not since 2006   Drug use: No   Sexual activity: Not on file    Comment: uses e-cig  Other Topics Concern   Not on file  Social History Narrative   Lives alone   No caffeine   Social Drivers of Corporate investment banker Strain: Not on file  Food Insecurity: Not on file  Transportation Needs: Not on file  Physical Activity: Not on file  Stress: Not on file  Social Connections: Unknown (12/28/2021)   [facility]   Social Network    Social Network: Not on file  Intimate Partner Violence: Unknown (11/19/2021)   Received from [facility]   HITS    Physically Hurt: Not on file    Insult or Talk Down To: Not on file    Threaten Physical Harm: Not on file    Scream or Curse: Not on file    Past Medical History, Surgical history, Social history, and Family history were reviewed and updated as appropriate.   Please see review of systems for further details on the patient's review from today.   Objective:   Physical Exam:  There were no vitals taken for this visit.  Physical Exam Constitutional:      General: He is not in acute distress. Musculoskeletal:        General: No deformity.  Neurological:     Mental Status: He is alert  and oriented to person, place, and time.     Cranial Nerves: No dysarthria.     Coordination: Coordination normal.  Psychiatric:        Attention and Perception: Attention and perception normal. He does not perceive auditory or visual hallucinations.        Mood and Affect: Mood is not anxious or depressed. Affect is not labile, blunt, angry or inappropriate.        Speech: Speech normal.        Behavior: Behavior normal. Behavior is cooperative.        Thought Content: Thought content normal. Thought content is not paranoid or delusional. Thought content  does not include homicidal or suicidal ideation. Thought content does not include suicidal plan.        Cognition and Memory: Cognition and memory normal.        Judgment: Judgment normal.     Comments: Talkative. severe anxiety much better and depression is improved  Gets joy out of things.     Lab Review:     Component Value Date/Time   NA 135 01/29/2024 2136   K 3.8 01/29/2024 2136   CL 102 01/29/2024 2136   CO2 22 01/29/2024 2136   GLUCOSE 113 (H) 01/29/2024 2136   BUN 30 (H) 01/29/2024 2136   CREATININE 1.00 01/29/2024 2136   CALCIUM 9.3 01/29/2024 2136   PROT 7.0 01/29/2024 2136   ALBUMIN 3.8 01/29/2024 2136   AST 30 01/29/2024 2136   ALT 30 01/29/2024 2136   ALKPHOS 54 01/29/2024 2136   BILITOT 0.7 01/29/2024 2136   GFRNONAA >60 01/29/2024 2136   GFRAA >60 08/02/2018 0458       Component Value Date/Time   WBC 10.0 01/29/2024 2136   RBC 4.19 (L) 01/29/2024 2136   HGB 13.2 01/29/2024 2136   HCT 38.0 (L) 01/29/2024 2136   PLT 229 01/29/2024 2136   MCV 90.7 01/29/2024 2136   MCH 31.5 01/29/2024 2136   MCHC 34.7 01/29/2024 2136   RDW 13.0 01/29/2024 2136   LYMPHSABS 1.3 01/29/2024 2136   MONOABS 0.6 01/29/2024 2136   EOSABS 0.1 01/29/2024 2136   BASOSABS 0.1 01/29/2024 2136    No results found for: POCLITH, LITHIUM    No results found for: PHENYTOIN, PHENOBARB, VALPROATE, CBMZ   04/23/2021 nortriptyline  level 113.  No med change indicated.  On nortriptyline  75 mg daily  .res Assessment: Plan:    Adam Harvey was seen today for follow-up, depression and anxiety.  Diagnoses and all orders for this visit:  Moderately severe recurrent major depression (HCC) -     nortriptyline  (PAMELOR ) 50 MG capsule; Take 2 capsules (100 mg total) by mouth at bedtime. -     lithium  carbonate 150 MG capsule; TAKE 1 CAPSULE(150 MG) BY MOUTH DAILY  Panic disorder with agoraphobia -     cloNIDine  (CATAPRES ) 0.1 MG tablet; TAKE 1 TABLET(0.1 MG) BY MOUTH TWICE DAILY -      clonazePAM  (KLONOPIN ) 1 MG tablet; Take 1 tablet (1 mg total) by mouth 2 (two) times daily.  Social anxiety disorder -     cloNIDine  (CATAPRES ) 0.1 MG tablet; TAKE 1 TABLET(0.1 MG) BY MOUTH TWICE DAILY -     clonazePAM  (KLONOPIN ) 1 MG tablet; Take 1 tablet (1 mg total) by mouth 2 (two) times daily.  GAD (generalized anxiety disorder) -     cloNIDine  (CATAPRES ) 0.1 MG tablet; TAKE 1 TABLET(0.1 MG) BY MOUTH TWICE DAILY -     clonazePAM  (KLONOPIN ) 1 MG tablet;  Take 1 tablet (1 mg total) by mouth 2 (two) times daily.  PTSD (post-traumatic stress disorder) -     cloNIDine  (CATAPRES ) 0.1 MG tablet; TAKE 1 TABLET(0.1 MG) BY MOUTH TWICE DAILY  Alcohol dependence, in remission (HCC)  Other orders -     lamoTRIgine  (LAMICTAL ) 150 MG tablet; Take 1 tablet (150 mg total) by mouth daily.    30 min face to face time with patient was spent on counseling and coordination of care. We discussed Patient stated he has been on antidepressants very long time and PREVIOUSLY wondered what his emotional state would be like off the medication.  This was partly influenced by online patient for him to described problems with withdrawal coming off of SSRIs.  He successfully came off antidepressant without withdrawal symptoms.  But then had relapse of both depression and anxiety within a few weeks off the antidepressant.  SX were severe and persistent but then dep AND anxiety have largely resolved with current med regimen.  Depression is OK and Anxiety is better markedly with clonidine .  Overall much better than years.   Residual social anxiety and not fully confident but functions as needed.  Clonidine   0.1 mg BID was helpful for anxiety and BP. Propranolol  also helps.  Consider lithium , mirtazapine , Remeron , trazodone, Deplin .   poss change to [facility] for further improvement.  He doesn't want to risk the change Continue Lithium  augmentation 150 bc diarrhea with 300 mg.   Switch to nortriptyline  75 HS led to  improvement and doing well until concussion.   Increase nortriptyline  to 100mg  HS. 04/23/2021 nortriptyline  level 113.  No med change indicated.  On nortriptyline  75 mg daily Disc SE.  Avoid sugar Using fiber and Dulcolax  Klonopin  1 mg BID  Disc risk triggering alcohol abuse.  He wants it bc feels desperate.  Disc fall risk.  We discussed the short-term risks associated with benzodiazepines including sedation and increased fall risk among others.  Discussed long-term side effect risk including dependence, potential withdrawal symptoms, and the potential eventual dose-related risk of dementia.  But recent studies from 2020 dispute this association between benzodiazepines and dementia risk. Newer studies in 2020 do not support an association with dementia. He doesn't notice SE He needs this mainly to leave the house.  Use LED or stop if possible. hydroxyzine   to just prn 25 mg prn anxiety and it helps And 1-2 hs prn insomnia.  Discussed his chronic thoughts about death. They resolved so far.  He commits to safety and is not having suicidal thoughts.  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   Disc BZ.  Some existential issues.    Disc CBT dealing with bucket list things.  So much to be grateful for.   Extensive discussion of how Covid has allowed social anxiety to get worse but is functional.   Genesight DT TR sx.  Reviewed results at length previously  Support continued sobriety.  Continue AA.    Rec continue counseling for therapy.  Disc ED meds.  Disc vitamin D in detail.  Vitamin D 5000 units daily  Extensive discussion about  Constipation management 1.  Lots of water  2.  Powdered fiber supplement such as MiraLAX , Citrucel, etc. preferably with a meal 3.  2 stool softeners a day 4.  Milk of magnesia or magnesium tablets if needed  Disc concerns about polypharmacy which seems unavoidable at present Option lamotrigine  increase if needed. Rec it if seasonal or weather  worsening mood.  Consider fish  oil and vitamin E.  2 fish oil daily with  highest dose of vitamin E you find which might help healing from concussion.   Plan: Plan: No med changes indicated propranolol  per PCP helps anxiety and BP Continue clonidine  0.1 mg twice daily which was helpful for anxiety and BP Continue lithium  150 nightly Continue lamotrigine  150 mg daily Continue nortriptyline  75 mg nightly Continue Klonopin  1 mg twice daily  Follow-up 4-6 weeks  Lorene Macintosh, MD, DFAPA     Please see After Visit Summary for patient specific instructions.  No future appointments.    No orders of the defined types were placed in this encounter.      -------------------------------

## 2024-03-22 NOTE — Patient Instructions (Addendum)
 2 enteric-coated fish oil daily with  highest dose of vitamin E you find.

## 2024-04-04 ENCOUNTER — Telehealth: Payer: Self-pay | Admitting: Psychiatry

## 2024-04-04 NOTE — Telephone Encounter (Signed)
 Ok.  Reduce clonazepam  to 1 mg AM and 0.5 mg PM for 3-4 weeks then reduce to 0.5 mg BID.  He can reduce either the am or pm choice first, his choice.  But not faster than 0.5 mg reduction per day every 3-4 weeks.

## 2024-04-04 NOTE — Telephone Encounter (Signed)
 Pt called at 9:10a stating that he needs to get off Clonazepam .  He didn't state why.  He says he needs to either see Dr Geoffry or get instructions on how to wean off of it.  Next appt 9/25

## 2024-04-04 NOTE — Telephone Encounter (Signed)
 Pt currently taking clonazepam  1 mg BID. He feels that at AA he is outside the bubble by being on benzo. He feels it is not conductive to his recovery to continue the clonazepam . He said he had discussed with you previously and that this would not be a quick process, but he would like to start it. He said he doesn't know what will happen with his anxiety after coming off the clonazepam , but said he wants something besides benzos to deal with it if needed.

## 2024-04-05 NOTE — Telephone Encounter (Signed)
 Called patient and got VM. LM that I would send response via MyChart and note was sent.

## 2024-04-12 DIAGNOSIS — I10 Essential (primary) hypertension: Secondary | ICD-10-CM | POA: Diagnosis not present

## 2024-04-12 DIAGNOSIS — M353 Polymyalgia rheumatica: Secondary | ICD-10-CM | POA: Diagnosis not present

## 2024-04-20 DIAGNOSIS — M79642 Pain in left hand: Secondary | ICD-10-CM | POA: Diagnosis not present

## 2024-04-20 DIAGNOSIS — R5383 Other fatigue: Secondary | ICD-10-CM | POA: Diagnosis not present

## 2024-04-20 DIAGNOSIS — M353 Polymyalgia rheumatica: Secondary | ICD-10-CM | POA: Diagnosis not present

## 2024-04-20 DIAGNOSIS — M79641 Pain in right hand: Secondary | ICD-10-CM | POA: Diagnosis not present

## 2024-04-24 NOTE — Telephone Encounter (Signed)
 He told Jolynn Pack a month ago he was going to call insurance to find out the cost and call them back but he hasn't so they are cancelling the order. The PA is also expired.

## 2024-05-03 DIAGNOSIS — M4726 Other spondylosis with radiculopathy, lumbar region: Secondary | ICD-10-CM | POA: Diagnosis not present

## 2024-05-11 ENCOUNTER — Ambulatory Visit: Admitting: Psychiatry

## 2024-05-11 DIAGNOSIS — F332 Major depressive disorder, recurrent severe without psychotic features: Secondary | ICD-10-CM

## 2024-05-11 DIAGNOSIS — F1021 Alcohol dependence, in remission: Secondary | ICD-10-CM

## 2024-05-11 DIAGNOSIS — F411 Generalized anxiety disorder: Secondary | ICD-10-CM

## 2024-05-11 DIAGNOSIS — F4001 Agoraphobia with panic disorder: Secondary | ICD-10-CM | POA: Diagnosis not present

## 2024-05-11 DIAGNOSIS — F4310 Post-traumatic stress disorder, unspecified: Secondary | ICD-10-CM | POA: Diagnosis not present

## 2024-05-11 DIAGNOSIS — F4010 Social phobia, unspecified: Secondary | ICD-10-CM

## 2024-05-11 NOTE — Progress Notes (Unsigned)
 Adam Harvey [id number] Dec 07, 1960 63 y.o.    Subjective:   Patient ID:  Adam Harvey is a 63 y.o. (DOB 1961/08/12) male.  Chief Complaint:  Chief Complaint  Patient presents with  . Follow-up  . Depression  . Anxiety     Adam Harvey presents to the office today for follow-up of several diagnoses and wanted an earlier appointment because he wanted to restart duloxetine .  visit January 16, 2019.  He had wanted a trial off of antidepressants in part because of reading on the Internet about withdrawal symptoms.  He had been off antidepressants for 2 months.  And felt more anxious and depressed.  We discussed it and decided to return to duloxetine  and increase to 60 mg daily.  visit in July after being back on the medication of duloxetine  60 mg daily he reported the following: It's great.  No depression but some anxiety that is managed.  He's 9/10 and satisfied.  Pleased with the dosage..  Tolerated well.  Sleep is good.  Better self care and better eating.  Working really hard but business is good.  In Holiday representative.  Location manager.  Energy better. Good appetite.  For years has had thoughts of if got terminal disease it would be easier and they are worse.  Hopelessness resolved. He had wanted to consider tapering off gabapentin  and was given permission to do that if he chose to do so. He was continued on lamotrigine  150 mg daily.  visit October 16 2019.   Don't feel depressed and kind of happy but lingering thoughts of is this as good as it gets?[name]  Not consuming him.  Not much excitement about things except work.  Doing design work for Teacher, early years/pre.  Some anxiety about being around people. Not DT Covid unless it made it worse.  Prefer to be alone.  Did a lot of theater in the past but not excited like he used to be.  Likes yard work.  Not interested in dating.  Wants to talk about Gabapentin  for pain.  Previously thought it helped.  Wants to try to stop it and see if he can do without  it.  Knees don't hurt bc replaced. Plan:  Cont duloxetine  60 and add Abilify  5 Taper gabapentin  off  12/25/19 appt, the following is noted: Anxiety not good.  Weary of dealing with it.  Had trouble dealing with number of people on the beach this weekend DT anxiety.  At times would like a BZ.  Always felt anxious but not managing it well.  Isolating. No SE gabapentin .  Did not stop it bc he forgot it. Plan: Increase gabapentin  to 300 BID and 600 HS for 2 days, then incrase to 2 in AM  And HS and 1 in middle of the day for 4 days, then increase to 600 TID.  For a week, if NR then increase to 900  TID.    02/08/2020 appointment with the following noted: Increased gabapentin  to 600 mg BID.  Has to nap if takes more.  No less anxious with the increase in dose.  No benefit to it. Asks about BZ.   Doesn't really want to leave the house and anxiety driving.  Can get anxious even in yard bc afraid of having to talk to people.  No paranoia. Dread of things.  Emotional eating. Plan: Switch to paroxetine  30 mg for better antianxiety effect and hopefully help depression a little more.  Start one half of paroxetine  20 mg tablet  and reduce duloxetine  to 2 of the 20 mg capsules for 5 days, Then increase paroxetine  to 1 tablet daily and reduce duloxetine  to 1 capsule daily for 5 days, Then increase to 1-1/2 paroxetine  tablets daily and stop duloxetine  Continue Abilify  5 mg augmentation for residual anhedonia.   03/08/2020 phone call patient stating he felt Paxil  was causing tremors in hands and feet and not helping with anxiety.  He was instructed to reduce paroxetine  to 20 mg daily and stop Abilify  for 3 days and then restart Abilify  1/2 tablet. 03/20/2020 phone call patient stating the above changes made no difference in tremor.  He was instructed to stop Abilify  and increase paroxetine  to 40 mg daily. 04/01/2020 patient called stating his PCP wanted him to stop taking paroxetine  completely to see if the tremors  would resolve. 05/01/2020 patient phone call stating he was diagnosed with Parkinson's disease but because of anxiety needed to be on some medication he was encouraged to restart the paroxetine  which had never had an adequate trial for his anxiety. 06/03/2020 phone call from patient complaining of panic attacks.  He had increase paroxetine  to 40 mg a day and was given hydroxyzine  as needed.  06/07/2020 urgent appointment with the following noted: Not well.  Depression as bad as ever and anxiety through the roof.  Worse beginning of the week bc feels completely overwhelming.   Getting OOB, brushing teeth is hard, being in public hard. Social avoidance affecting work.  Don't feel scared of PD but slap in the face about age.  Single and in middle of 5 year bankruptcy.  Trouble with concentration. Not suicidal but death thoughts. Yesterday a good day and laughed first time in a while. Poor appetite and lost weight. On paroxetine  40 mg for 3 weeks. No SE.   Trouble staying asleep. 4-5  Hours. Plan: Needs more time to respond to paroxetine  40 mg daily Deplin 15 mg daily for depression  With samples. Increase hydroxyzine  for ST anxiety.  07/10/2020 appointment with the following noted: Depression is better but anxiety is through the roof.  Can laugh again.  Less hopeless and paralyzed by depression.  Now 5/10 depression.  Anxiety is still 10/10.  Will have nausea going out.  Has managed the anxiety pretty well.  Wonders if PD is causing anxiety.  Over the last 2-3 years more social anxiety.   No SE with Paxil . Hydroxyzine  TID and 2 HS.  Lorazepam  helps but manages it. Plan: Increase paroxetine  60 mg daily.  It helped depression but not anxiety so far.  08/06/20 appt with following noted: Cant tell a difference.  Anxiety still up.  BP up with stress.  Takes Ativan  prn. Started hydroxyzine  75 HS and sleeping better.  If EMA then hard to get back to sleep.  Not anxious at night typically.  Around people  tense and heart races. I don't feel as depressed but no decorations for Xmas for the first time.  Typically after Jan 1 falls into depression and a bit anxious over it. Plan: Continue paroxetine  60 mg daily at least another month.  It helped depression but not anxiety so far. Increase propranolol  to 60-80 mg TID for anxiety  09/05/2020 appointment with following noted: Still awakens terrified of the world and hard to get OOB. Depression is still a bit of a problem. Going anywhere to deal with anyone makes him very anxious to the point of nausea.  When takes lorazepam  it eases it off rather than eliminating it. Lorazepam  1  mg avg daily.  Really bad Xmas.  Even to the point of scared to go out with kids.  Once out he does ok usually. SE a little tired.  Not severe.   Hydroxyzine  during day and 75 mg at night for sleep and it helps. No libido. Dx PD so can't use risperidone Plan: Increase paroxetine  80 mg daily at least another month.  It helped depression but not anxiety so far.  10/10/2020 appointment with the following noted: Anxiety no better.  Hopeless and waiting to die.  No joy or relaxation around the corner. Lorazepam  helps otherwise shaking and sweating.  Cancelled appts yesterday.  Likes there's something inside the body that's twisting. Plan no med changes.  We will give paroxetine  80 mg another month or so to help.  11/06/2020 appointment with the following noted: I still have pit in stomach but anxiety is better with less shaking sweating, SOB.  Still don't want to go out much.  Still a lot of worry.  Once home in PM then can let go easier.  Goes to bed at 9 and sleeps good.  Normal function. No change in hopelessness.  No joy or looking forward.  I feel sad and lonely a good part of the time.  Enjoyed football game. Neuro yesterday doing well with early stage PD. Plan: Reduce paroxetine  by 50% every 2 weeks down to 20 mg and stay at 20 mg. Start Viibryd  10 mg daily with 350  calories for 1 week then increase to 20 mg daily  12/10/20 appt noted:  Problem with cost Viibryd . Continued with Viibryd  20 mg daily and down to paroxetine  20 mg daily without withdrawal.  The terror of the outside world continues.  Still anxious around most other people and going out. For the first time in a long while,  over the last week, he felt joy and connection and he even made other people laugh.  Had less anxiety around them than usual.  Less hopeless.  Managing work better. Found copay card and last RX $45.   No SE and no withdrawal.  Plan:  paroxetine   stay at 20 mg until the increase in Viibryd  can have benefit Increase Viibryd  with 350 calories to 40 mg daily DC Deplin due to no response   01/07/2021 appointment with the following noted: The fear of the world remains but significantly less hopeless.  Very anxious over bankruptcy hearing today.  18 mos left on dealing with that. Son graduated from law-school recently.  He got 2 awards.  Left after he walked across the stage.  Couldn't stay in the crowd later.  Ongoing panic with agoraphobia.   Less death thoughts but still some. Works from house and stays there.   Tolerated Viibryd  well.   NO SE Needs Klonopin  and hydroxyzine . Plan: paroxetine   stay at 20 mg until the increase in Viibryd  can have benefit continue Viibryd  with 350 calories to 40 mg daily  02/06/21 appt with following noted: Viibryd  40 and paxil  20. Stopped lithium  after 3 weeks DT diarrhea and NR. Managing but not great.  Not dramatic improvement but thinks overall it is better than with Paxil .  Less depressed than 6 mos ago.  But anxiety over normal activities even in grocery store or waiting room. Working hard.  Managing meetings.   Plan: Reduce Paxil  to 10 mg for 2 weeks and stop it Increase Viibryd  to 60 mg daily for better anxiety effects  04/03/2021 appointment with following noted: Couldn't afford Viibryd  at $  200/mo so dropped down to 40 mg  daily. More crying on Viibryd  even sobbing if not at work.  Feels very alone and hopeless.  Recurring dream disturbing with homesick feelings. Death thoughts without sui intent or plan.  Sleep good. Can do ok around people.  Able to respond to respond to positive stimuli. Plan:  Reduce Viibryd  to 1/2 tablet 20 mg and start nortriptyline  25 mg 1 at night for 5 nights then, Continue Viibryd  20 mg daily and increase nortriptyline  2 of the 25 mg capsules for 5 nights, then Stop Viibryd  and increase nortriptyline  to 3 of the 25 mg capsules nightly. Wait 1 week and get the blood test in the morning. Disc SE  05/19/2021 appointment with the following noted: Cautiously optimistic with change to nortriptyline .  No longer homesick feeling, more confident and less crying.  Still doesn't like going anywhere.  Trying to reduce clonazepam .  Last Saturday 60th BD and really down bc kids didn't come to surprise him..  Lonely. PE last week was good. Handling going out easier.   Been on nortriptyline  75 mg for a month. No full panic lately.  Still leaving house and driving causes anxiety. No SE except 2-3 nights of indigestion. Sleep is good. Plan: Lithium  augmentation option to try lower dose 150 bc diarrhea with 300 mg. Yes.  06/18/21 appt noted: Mood depression less severe without crying.  Anxiety still a problem.  Not happy or sad.  Nervous driving. Worrying PD may be worse. Clonazepam  1 mg does not cause sleepiness, dizziness. No particular effect of lihtium 150. Appetite reduced.   Propranolol  keeps BP down. Plan clonidine  trial for anxiety  07/30/2021 appt noted: Sees benefit with clonidine  0.1 mg BID.  More at ease.  Still can worry.  Takes clonazepam  prn.  Average 1.5 mg daily.  I feel better.  Less hopeless and enjoys things more. Often depression in January. SE constipation and dry mouth. Plan: Reduce propranolol  to 40 mg BID bc borderline low BP Continue clonidine  0.1 mg twice daily which  was helpful Continue lithium  150 nightly Continue lamotrigine  150 mg daily Continue nortriptyline  75 mg nightly Reduce propranolol  to 40 mg twice daily due to 2 borderline low blood pressure Continue Klonopin  1 mg twice daily Try to reduce hydroxyzine  to 25 mg 3 times daily and 25-50 nightly Continue gabapentin  Discussed referral for TMS  09/04/2021 appointment with the following noted: Covid 10 days ago first time.  Bad first 3 days. Reduced propranolol  and lisinopril  and now 1/2 tablet lisinopril . Feeling better despite Jan always terrible month for me.   Less fearful going  places but would prefer to be at home.   Plan: reduce hydroxyzine   to just prn 25 mg prn anxiety and it helps And 1-2 hs prn insomnia.  10/06/2021 appointment the following noted: Stopped hyrdroxyzine except prn going out. Lonely but not depressed other than mild seasonal. Terribly constipated using stool softener. Not much appetite and lost 30# over last 3-4 mos.  Not had to work at it. Anxiety is still better and managed generally.  Can still hget anxious in situations. Plan: Plan: Plan: No med changes propranolol  per PCP Continue clonidine  0.1 mg twice daily which was helpful Continue lithium  150 nightly Continue lamotrigine  150 mg daily Continue nortriptyline  75 mg nightly Continue Klonopin  1 mg twice daily prn hydroxyzine  to 25 mg 3 times daily and 25-50 nightly, use LED Continue gabapentin   12/03/2021 appointment with the following noted: Off gabapentin  and reduced hydroxyzine . Stable usually.  More loneliness than depression.   Still anxious going anywhere but once there is fine. Little appetite and lost 40#. Some low BP lately.  Adjusting dose. Busy with work and a couple of close friends. Able to talk to kids more and longer now. SE dry mouth and ED Plan: no med changes  02/12/22 appt noted: Lost 50# with less appetite than in the past.  At 200# but plateaued. Back pain much better. Off  hydroxyzine  without problems. A weeks worth of rain and clouds caused depression. Resolved with better weather. Dating.  Lonely and still doesn't love being with people but does so. 3 very close friends.  That's enough.  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 Swamped with work and grateful for it.  10-11 big house projects. Dinner with sons tomorrow.  One is Clinical research associate and other retired Arts development officer. No panic.  Residual anxiety with people.  Feels safer at home. SE dry mouth and constipation. Plan no med changes  05/18/22 appt noted: Even better, than in 5-10 years. 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 Continued social anxiety and avoids some things. Gradually reducing clonazepam .   SE dry mouth and constipation managed. Appetite is back. And gained back 5# gain. No longer has death thoughts. Dating.  Pleased.  Business is good. Sleep is good.   Pleased with meds. Able to reduce Parkinson's meds. Plan: propranolol  per PCP Continue clonidine  0.1 mg twice daily which was helpful for anxiety and BP Continue lithium  150 nightly Continue lamotrigine  150 mg daily Continue nortriptyline  75 mg nightly Continue Klonopin  1 mg twice daily  09/22/22 appt noted: Jan never a good month but 80% better than in the past. Anxiety still there a little but not severe.  Has to push himself to go to [employer] but will do so. Work is good.  Completed chapter 13 bankruptcy and it's behind him. Dating for about a year and it's going well. Has been up to [place] with her. I feel balanced.  Not on top of the world. Son graduated and starts ICU nurse. PE a month ago. Doesn't think he has Parkinson's.  Not taking tremor meds in 8 mos.   SE dry and constipation managed.    03/23/23 appt noted: No med changes Kind of flat but status quo. Some concerns about potential recession.  But he's generally busy. Some anticipatory anxiety but not dep. Superficial  dating relationship.  Worries about being alone with age.   Kids well.  Dog doing well. Went to [employer] and enjoyed it.  Clonidine  and propranolol  helping BP and anxiety Plan no changes  09/23/23 appt noted: Psych med: Clonazepam  1 mg twice daily, clonidine  0.1 mg twice daily, lamotrigine  150 twice daily, lithium  150 nightly, propranolol  40 twice daily, nortriptyline  75 nightly. Without propranolol  BP goes up.   Best Xmas with kids than in 20 years.  Saw [place]. Back pain.  Shot didn't help much.  Stiffness in hands, thighs;  walking is stiff.  Pending neurologist.  He's nervous about it. Close friend with cancer.   Mood and anxiety is pretty stable .  No panic.   Enjoys kids and dating relationship.   No SI anymore but does have death thoughts some. No SE problems. Plan no med changes  03/22/24 appt noted:  Psych med: Clonazepam  1 mg twice daily, clonidine  0.1 mg twice daily, lamotrigine  150 twice daily, lithium  150 AM, propranolol  40  twice daily, nortriptyline  75 nightly. Interesting 6 mos.  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  Prednisone taper.  Took 10 mg and tapering 1 mg per month and so far ok.   Split up with GF.   8 weeks ago fell down the steps and hit back of head.  LOC 10 mins. Dx post-concussive syndrome and as anxious and dep as I've ever been. Was not dep before the concussion.  Can only work 4-5 hours daily but that is an improvement.  Focus and conc is not good.  Feels very alone.   AA supportive.  Has seen neuro about this. At times don't eat bc scared to go to grocery bc anxiety and seems overwhelming.   More irritable and emotional.  Alone and scared. Plan: Increase nortriptyline  to 100mg  HS.  04/04/24 TC:  Pt currently taking clonazepam  1 mg BID. He feels that at AA he is outside the bubble by being on benzo. He feels it is not conductive to his recovery to continue the clonazepam . He said he had discussed with you previously and that this would not be a quick process, but  he would like to start it. He said he doesn't know what will happen with his anxiety after coming off the clonazepam , but said he wants something besides benzos to deal with it if needed.     MD resp:  Ok.  Reduce clonazepam  to 1 mg AM and 0.5 mg PM for 3-4 weeks then reduce to 0.5 mg BID.  He can reduce either the am or pm choice first, his choice.  But not faster than 0.5 mg reduction per day every 3-4 weeks. Lorene Macintosh, MD, DFAPA      05/11/24 appt noted:  Med: reduced Clonazepam  1 mg HS, clonidine  0.1 mg twice daily, lamotrigine  150 twice daily, lithium  150 AM, propranolol  40 twice daily, nortriptyline  100 nightly. No SE  Better than in 10 years.   Broke up with GF mid July and crashed emotionally and was smoking pot and SI.  Dep and anxiety was worse since Covid when also was not attending AA.  Got back in AA 6 weeks ago and life is better  by a lot.  Quit pot and wants to come off BZ.  Dep is better.   More comfortable with himself.  Walking 3 miles daily Had concussion since from a fall.    Post concussion sx better including fog, forgetfulness, excessive sleep,  Anxiety reduced dramatically Clonidine  is helping the BP too and anxiety.    Doing AA by internet.  Sober since 2006.  No history of BZ dependence.  Past Psychiatric Medication Trials:  Brief duloxetine , sertraline  200, paroxetine  80, Viibryd  40,  nortriptyline  75 buspirone dizzy, gabapentin ,  Lorazepam  , clonazepam  is better Hydroxyzine   Propranolol  40 BID Clonidine  0.1 BID Lithium  300 diarrhea Lamotrigine  150 Abilify  helped the depression and sense of dread but nothing for anxiety. Dx PD so can't use risperidone  Review of Systems:   Review of Systems  Constitutional:  Positive for unexpected weight change. Negative for fatigue.  Cardiovascular:  Negative for palpitations.  Gastrointestinal:  Positive for constipation. Negative for diarrhea and nausea.  Musculoskeletal:  Positive for back pain. Negative for  arthralgias.  Neurological:  Negative for dizziness and tremors.  Psychiatric/Behavioral:  Negative for agitation, behavioral problems, confusion, decreased concentration, dysphoric mood, hallucinations, self-injury, sleep disturbance and suicidal ideas. The patient is nervous/anxious. The patient is not hyperactive.     Medications: I have reviewed  the patient's current medications.  Current Outpatient Medications  Medication Sig Dispense Refill  . aspirin  EC 81 MG tablet Take 1 tablet (81 mg total) by mouth 2 (two) times daily. 60 tablet 0  . clonazePAM  (KLONOPIN ) 1 MG tablet Take 1 tablet (1 mg total) by mouth 2 (two) times daily. 60 tablet 5  . cloNIDine  (CATAPRES ) 0.1 MG tablet TAKE 1 TABLET(0.1 MG) BY MOUTH TWICE DAILY 180 tablet 1  . lamoTRIgine  (LAMICTAL ) 150 MG tablet Take 1 tablet (150 mg total) by mouth daily. 90 tablet 1  . lithium  carbonate 150 MG capsule TAKE 1 CAPSULE(150 MG) BY MOUTH DAILY 90 capsule 1  . meclizine  (ANTIVERT ) 25 MG tablet Take 1 tablet (25 mg total) by mouth 3 (three) times daily as needed for dizziness. 30 tablet 0  . meloxicam (MOBIC) 15 MG tablet Take 15 mg by mouth daily.    . methocarbamol  (ROBAXIN ) 500 MG tablet Take 500 mg by mouth 3 (three) times daily.    . Multiple Vitamins-Minerals (MULTIVITAMIN WITH MINERALS) tablet Take 1 tablet by mouth daily.    . nortriptyline  (PAMELOR ) 50 MG capsule Take 2 capsules (100 mg total) by mouth at bedtime. 180 capsule 0  . omeprazole (PRILOSEC) 20 MG capsule Take 20 mg by mouth 2 (two) times daily.    . predniSONE (DELTASONE) 1 MG tablet Take 4 mg by mouth daily. (Patient taking differently: Take 1 mg by mouth daily with breakfast. Takes along with the 5 mg tablet)    . predniSONE (DELTASONE) 5 MG tablet Take 5 mg by mouth daily.    . propranolol  (INDERAL ) 40 MG tablet Take 1 tablet (40 mg total) by mouth 2 (two) times daily. 180 tablet 0  . simvastatin  (ZOCOR ) 40 MG tablet Take 40 mg by mouth daily.    . tadalafil   (CIALIS ) 20 MG tablet TAKE 1 TABLET(20 MG) BY MOUTH DAILY AS NEEDED FOR ERECTILE DYSFUNCTION 30 tablet 3   No current facility-administered medications for this visit.    Medication Side Effects: None  Allergies: No Known Allergies  Past Medical History:  Diagnosis Date  . Alcoholism (HCC)    sober since 01-2011   . Anxiety   . Arthritis   . Balance problem   . Complication of anesthesia    unsuccessful spinal with right knee replacement, used general anesthesia  . Depression   . Difficulty concentrating   . Diverticulosis 2012  . GERD (gastroesophageal reflux disease)   . Hemorrhoids   . Hyperlipidemia   . Hypertension   . Internal hemorrhoids 2012  . Neuromuscular disorder (HCC)    Parkinson  . Obsessive compulsive disorder   . Osteoarthritis   . Rotator cuff tear, left   . Tear of right rotator cuff 08/03/2014  . Wears contact lenses     Family History  Problem Relation Age of Onset  . Stroke Mother   . Diabetes Mother   . Cancer Father   . Parkinson's disease Maternal Grandmother   . Colon cancer Neg Hx   . Colon polyps Neg Hx   . Esophageal cancer Neg Hx   . Stomach cancer Neg Hx   . Rectal cancer Neg Hx     Social History   Socioeconomic History  . Marital status: Divorced    Spouse name: Not on file  . Number of children: 3  . Years of education: Not on file  . Highest education level: Bachelor's degree (e.g., BA, AB, BS)  Occupational History  . Occupation: Program  Director  Tobacco Use  . Smoking status: Former    Types: E-cigarettes, Cigarettes    Quit date: 08/01/2009    Years since quitting: 14.7  . Smokeless tobacco: Never  Vaping Use  . Vaping status: Every Day  . Start date: 08/12/2014  Substance and Sexual Activity  . Alcohol use: No    Comment: not since 2006  . Drug use: No  . Sexual activity: Not on file    Comment: uses e-cig  Other Topics Concern  . Not on file  Social History Narrative   Lives alone   No caffeine    Social Drivers of Corporate investment banker Strain: Not on file  Food Insecurity: Not on file  Transportation Needs: Not on file  Physical Activity: Not on file  Stress: Not on file  Social Connections: Unknown (12/28/2021)   [facility]   Social Network   . Social Network: Not on file  Intimate Partner Violence: Unknown (11/19/2021)   [facility]   HITS   . Physically Hurt: Not on file   . Insult or Talk Down To: Not on file   . Threaten Physical Harm: Not on file   . Scream or Curse: Not on file    Past Medical History, Surgical history, Social history, and Family history were reviewed and updated as appropriate.   Please see review of systems for further details on the patient's review from today.   Objective:   Physical Exam:  There were no vitals taken for this visit.  Physical Exam Constitutional:      General: He is not in acute distress. Musculoskeletal:        General: No deformity.  Neurological:     Mental Status: He is alert and oriented to person, place, and time.     Cranial Nerves: No dysarthria.     Coordination: Coordination normal.  Psychiatric:        Attention and Perception: Attention and perception normal. He does not perceive auditory or visual hallucinations.        Mood and Affect: Mood is not anxious or depressed. Affect is not labile, blunt, angry or inappropriate.        Speech: Speech normal.        Behavior: Behavior normal. Behavior is cooperative.        Thought Content: Thought content normal. Thought content is not paranoid or delusional. Thought content does not include homicidal or suicidal ideation. Thought content does not include suicidal plan.        Cognition and Memory: Cognition and memory normal.        Judgment: Judgment normal.     Comments: Talkative. severe anxiety much better and depression is improved  Gets joy out of things.     Lab Review:     Component Value Date/Time   NA  135 01/29/2024 2136   K 3.8 01/29/2024 2136   CL 102 01/29/2024 2136   CO2 22 01/29/2024 2136   GLUCOSE 113 (H) 01/29/2024 2136   BUN 30 (H) 01/29/2024 2136   CREATININE 1.00 01/29/2024 2136   CALCIUM 9.3 01/29/2024 2136   PROT 7.0 01/29/2024 2136   ALBUMIN 3.8 01/29/2024 2136   AST 30 01/29/2024 2136   ALT 30 01/29/2024 2136   ALKPHOS 54 01/29/2024 2136   BILITOT 0.7 01/29/2024 2136   GFRNONAA >60 01/29/2024 2136   GFRAA >60 08/02/2018 0458       Component Value Date/Time  WBC 10.0 01/29/2024 2136   RBC 4.19 (L) 01/29/2024 2136   HGB 13.2 01/29/2024 2136   HCT 38.0 (L) 01/29/2024 2136   PLT 229 01/29/2024 2136   MCV 90.7 01/29/2024 2136   MCH 31.5 01/29/2024 2136   MCHC 34.7 01/29/2024 2136   RDW 13.0 01/29/2024 2136   LYMPHSABS 1.3 01/29/2024 2136   MONOABS 0.6 01/29/2024 2136   EOSABS 0.1 01/29/2024 2136   BASOSABS 0.1 01/29/2024 2136    No results found for: POCLITH, LITHIUM    No results found for: PHENYTOIN, PHENOBARB, VALPROATE, CBMZ   04/23/2021 nortriptyline  level 113.  No med change indicated.  On nortriptyline  75 mg daily  .res Assessment: Plan:    Gevin was seen today for follow-up, depression and anxiety.  Diagnoses and all orders for this visit:  Moderately severe recurrent major depression (HCC)  Panic disorder with agoraphobia  GAD (generalized anxiety disorder)  PTSD (post-traumatic stress disorder)  Social anxiety disorder  Alcohol dependence, in remission (HCC)   30 min face to face time with patient was spent on counseling and coordination of care. We discussed Patient stated he has been on antidepressants very long time and PREVIOUSLY wondered what his emotional state would be like off the medication.  This was partly influenced by online patient for him to described problems with withdrawal coming off of SSRIs.  He successfully came off antidepressant without withdrawal symptoms.  But then had relapse of both depression and  anxiety within a few weeks off the antidepressant.  SX were severe and persistent but then dep AND anxiety have largely resolved with current med regimen.  Depression is OK and Anxiety is better markedly with clonidine .  Overall much better than years.   Residual social anxiety and not fully confident but functions as needed.  Clonidine   0.1 mg BID was helpful for anxiety and BP. Propranolol  also helps.  Consider lithium , mirtazapine , Remeron , trazodone, Deplin .   poss change to [facility] for further improvement.  He doesn't want to risk the change Continue Lithium  augmentation 150 bc diarrhea with 300 mg.   Switch to nortriptyline  75 HS led to improvement and doing well until concussion.   Increase nortriptyline  to 100mg  HS. 04/23/2021 nortriptyline  level 113.  No med change indicated.  On nortriptyline  75 mg daily Disc SE.  Avoid sugar Using fiber and Dulcolax  Klonopin  1 mg BID  Disc risk triggering alcohol abuse.  He wants it bc feels desperate.  Disc fall risk.  We discussed the short-term risks associated with benzodiazepines including sedation and increased fall risk among others.  Discussed long-term side effect risk including dependence, potential withdrawal symptoms, and the potential eventual dose-related risk of dementia.  But recent studies from 2020 dispute this association between benzodiazepines and dementia risk. Newer studies in 2020 do not support an association with dementia. He doesn't notice SE He needs this mainly to leave the house.  Use LED or stop if possible. hydroxyzine   to just prn 25 mg prn anxiety and it helps And 1-2 hs prn insomnia.  Discussed his chronic thoughts about death. They resolved so far.  He commits to safety and is not having suicidal thoughts.  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   Disc BZ.  Some existential issues.    Disc CBT dealing with bucket list things.  So much to be grateful for.   Extensive discussion of how Covid has  allowed social anxiety to get worse but is functional.   Genesight DT TR sx.  Reviewed results at length previously  Support continued sobriety.  Continue AA.    Rec continue counseling for therapy.  Disc ED meds.  Disc vitamin D in detail.  Vitamin D 5000 units daily  Extensive discussion about  Constipation management 1.  Lots of water  2.  Powdered fiber supplement such as MiraLAX , Citrucel, etc. preferably with a meal 3.  2 stool softeners a day 4.  Milk of magnesia or magnesium tablets if needed  Disc concerns about polypharmacy which seems unavoidable at present Option lamotrigine  increase if needed. Rec it if seasonal or weather worsening mood.  Consider fish oil and vitamin E.  2 fish oil daily with  highest dose of vitamin E you find which might help healing from concussion.   Plan: No med changes indicated propranolol  per PCP helps anxiety and BP Continue clonidine  0.1 mg twice daily which was helpful for anxiety and BP Continue lithium  150 nightly Continue lamotrigine  150 mg daily Continue nortriptyline   Continue Klonopin  1 mg twice daily  Follow-up  weeks  Lorene Macintosh, MD, DFAPA     Please see After Visit Summary for patient specific instructions.  No future appointments.    No orders of the defined types were placed in this encounter.      -------------------------------

## 2024-05-12 ENCOUNTER — Encounter: Payer: Self-pay | Admitting: Psychiatry

## 2024-05-31 DIAGNOSIS — L570 Actinic keratosis: Secondary | ICD-10-CM | POA: Diagnosis not present

## 2024-05-31 DIAGNOSIS — L578 Other skin changes due to chronic exposure to nonionizing radiation: Secondary | ICD-10-CM | POA: Diagnosis not present

## 2024-05-31 DIAGNOSIS — C44319 Basal cell carcinoma of skin of other parts of face: Secondary | ICD-10-CM | POA: Diagnosis not present

## 2024-05-31 DIAGNOSIS — L821 Other seborrheic keratosis: Secondary | ICD-10-CM | POA: Diagnosis not present

## 2024-06-01 DIAGNOSIS — Z79899 Other long term (current) drug therapy: Secondary | ICD-10-CM | POA: Diagnosis not present

## 2024-06-01 DIAGNOSIS — M353 Polymyalgia rheumatica: Secondary | ICD-10-CM | POA: Diagnosis not present

## 2024-06-01 DIAGNOSIS — M2560 Stiffness of unspecified joint, not elsewhere classified: Secondary | ICD-10-CM | POA: Diagnosis not present

## 2024-06-01 DIAGNOSIS — M79642 Pain in left hand: Secondary | ICD-10-CM | POA: Diagnosis not present

## 2024-06-02 ENCOUNTER — Other Ambulatory Visit (HOSPITAL_COMMUNITY): Payer: Self-pay | Admitting: Neurosurgery

## 2024-06-02 DIAGNOSIS — M4726 Other spondylosis with radiculopathy, lumbar region: Secondary | ICD-10-CM

## 2024-06-12 DIAGNOSIS — Z1212 Encounter for screening for malignant neoplasm of rectum: Secondary | ICD-10-CM | POA: Diagnosis not present

## 2024-06-12 DIAGNOSIS — E785 Hyperlipidemia, unspecified: Secondary | ICD-10-CM | POA: Diagnosis not present

## 2024-06-13 ENCOUNTER — Other Ambulatory Visit (HOSPITAL_COMMUNITY)

## 2024-06-13 ENCOUNTER — Ambulatory Visit (HOSPITAL_COMMUNITY)
Admission: RE | Admit: 2024-06-13 | Discharge: 2024-06-13 | Disposition: A | Discharge status: Home / Self Care | Source: Ambulatory Visit | Attending: Neurosurgery | Admitting: Neurosurgery

## 2024-06-13 DIAGNOSIS — M5117 Intervertebral disc disorders with radiculopathy, lumbosacral region: Secondary | ICD-10-CM | POA: Diagnosis not present

## 2024-06-13 DIAGNOSIS — M4726 Other spondylosis with radiculopathy, lumbar region: Secondary | ICD-10-CM | POA: Diagnosis not present

## 2024-06-13 DIAGNOSIS — M48061 Spinal stenosis, lumbar region without neurogenic claudication: Secondary | ICD-10-CM | POA: Diagnosis not present

## 2024-06-13 DIAGNOSIS — M5116 Intervertebral disc disorders with radiculopathy, lumbar region: Secondary | ICD-10-CM | POA: Diagnosis not present

## 2024-06-18 ENCOUNTER — Other Ambulatory Visit: Payer: Self-pay | Admitting: Psychiatry

## 2024-06-18 DIAGNOSIS — F332 Major depressive disorder, recurrent severe without psychotic features: Secondary | ICD-10-CM

## 2024-06-19 DIAGNOSIS — C44319 Basal cell carcinoma of skin of other parts of face: Secondary | ICD-10-CM | POA: Diagnosis not present

## 2024-06-20 ENCOUNTER — Other Ambulatory Visit: Payer: Self-pay | Admitting: Psychiatry

## 2024-06-20 DIAGNOSIS — F332 Major depressive disorder, recurrent severe without psychotic features: Secondary | ICD-10-CM

## 2024-06-22 DIAGNOSIS — Z1331 Encounter for screening for depression: Secondary | ICD-10-CM | POA: Diagnosis not present

## 2024-06-22 DIAGNOSIS — R82998 Other abnormal findings in urine: Secondary | ICD-10-CM | POA: Diagnosis not present

## 2024-06-22 DIAGNOSIS — I10 Essential (primary) hypertension: Secondary | ICD-10-CM | POA: Diagnosis not present

## 2024-06-22 DIAGNOSIS — M353 Polymyalgia rheumatica: Secondary | ICD-10-CM | POA: Diagnosis not present

## 2024-06-22 DIAGNOSIS — Z23 Encounter for immunization: Secondary | ICD-10-CM | POA: Diagnosis not present

## 2024-06-22 DIAGNOSIS — Z0000 Encounter for general adult medical examination without abnormal findings: Secondary | ICD-10-CM | POA: Diagnosis not present

## 2024-06-29 DIAGNOSIS — Z85828 Personal history of other malignant neoplasm of skin: Secondary | ICD-10-CM | POA: Diagnosis not present

## 2024-06-29 DIAGNOSIS — L821 Other seborrheic keratosis: Secondary | ICD-10-CM | POA: Diagnosis not present

## 2024-06-29 DIAGNOSIS — L918 Other hypertrophic disorders of the skin: Secondary | ICD-10-CM | POA: Diagnosis not present

## 2024-06-29 DIAGNOSIS — L570 Actinic keratosis: Secondary | ICD-10-CM | POA: Diagnosis not present

## 2024-06-29 DIAGNOSIS — Z1212 Encounter for screening for malignant neoplasm of rectum: Secondary | ICD-10-CM | POA: Diagnosis not present

## 2024-06-29 DIAGNOSIS — D225 Melanocytic nevi of trunk: Secondary | ICD-10-CM | POA: Diagnosis not present

## 2024-07-03 DIAGNOSIS — M4726 Other spondylosis with radiculopathy, lumbar region: Secondary | ICD-10-CM | POA: Diagnosis not present

## 2024-07-03 DIAGNOSIS — M4316 Spondylolisthesis, lumbar region: Secondary | ICD-10-CM | POA: Diagnosis not present

## 2024-07-03 DIAGNOSIS — Z6829 Body mass index (BMI) 29.0-29.9, adult: Secondary | ICD-10-CM | POA: Diagnosis not present

## 2024-07-07 DIAGNOSIS — M25512 Pain in left shoulder: Secondary | ICD-10-CM | POA: Diagnosis not present

## 2024-07-17 DIAGNOSIS — M25512 Pain in left shoulder: Secondary | ICD-10-CM | POA: Diagnosis not present

## 2024-07-20 DIAGNOSIS — M25512 Pain in left shoulder: Secondary | ICD-10-CM | POA: Diagnosis not present

## 2024-07-24 DIAGNOSIS — M75112 Incomplete rotator cuff tear or rupture of left shoulder, not specified as traumatic: Secondary | ICD-10-CM | POA: Diagnosis not present

## 2024-08-21 ENCOUNTER — Ambulatory Visit (INDEPENDENT_AMBULATORY_CARE_PROVIDER_SITE_OTHER): Admitting: Psychiatry

## 2024-08-21 ENCOUNTER — Encounter: Payer: Self-pay | Admitting: Psychiatry

## 2024-08-21 DIAGNOSIS — F411 Generalized anxiety disorder: Secondary | ICD-10-CM | POA: Diagnosis not present

## 2024-08-21 DIAGNOSIS — F4010 Social phobia, unspecified: Secondary | ICD-10-CM

## 2024-08-21 DIAGNOSIS — F4001 Agoraphobia with panic disorder: Secondary | ICD-10-CM

## 2024-08-21 DIAGNOSIS — F4310 Post-traumatic stress disorder, unspecified: Secondary | ICD-10-CM | POA: Diagnosis not present

## 2024-08-21 DIAGNOSIS — F332 Major depressive disorder, recurrent severe without psychotic features: Secondary | ICD-10-CM

## 2024-08-21 DIAGNOSIS — F1021 Alcohol dependence, in remission: Secondary | ICD-10-CM | POA: Diagnosis not present

## 2024-08-21 NOTE — Progress Notes (Signed)
 AMBERS IYENGAR [id number] [birth year] 64 y.o.    Subjective:   Patient ID:  Adam Harvey is a 64 y.o. (DOB [birth year]) male.  Chief Complaint:  Chief Complaint  Patient presents with   Follow-up   Depression   Anxiety   Medication Problem     Adam Harvey presents to the office today for follow-up of several diagnoses and wanted an earlier appointment because he wanted to restart duloxetine .  visit January 16, 2019.  He had wanted a trial off of antidepressants in part because of reading on the Internet about withdrawal symptoms.  He had been off antidepressants for 2 months.  And felt more anxious and depressed.  We discussed it and decided to return to duloxetine  and increase to 60 mg daily.  visit in July after being back on the medication of duloxetine  60 mg daily he reported the following: It's great.  No depression but some anxiety that is managed.  He's 9/10 and satisfied.  Pleased with the dosage..  Tolerated well.  Sleep is good.  Better self care and better eating.  Working really hard but business is good.  In holiday representative.  Location manager.  Energy better. Good appetite.  For years has had thoughts of if got terminal disease it would be easier and they are worse.  Hopelessness resolved. He had wanted to consider tapering off gabapentin  and was given permission to do that if he chose to do so. He was continued on lamotrigine  150 mg daily.  visit October 16 2019.   Don't feel depressed and kind of happy but lingering thoughts of is this as good as it gets?[name]  Not consuming him.  Not much excitement about things except work.  Doing design work for teacher, early years/pre.  Some anxiety about being around people. Not DT Covid unless it made it worse.  Prefer to be alone.  Did a lot of theater in the past but not excited like he used to be.  Likes yard work.  Not interested in dating.  Wants to talk about Gabapentin  for pain.  Previously thought it helped.  Wants to try to stop it and see if  he can do without it.  Knees don't hurt bc replaced. Plan:  Cont duloxetine  60 and add Abilify  5 Taper gabapentin  off  12/25/19 appt, the following is noted: Anxiety not good.  Weary of dealing with it.  Had trouble dealing with number of people on the beach this weekend DT anxiety.  At times would like a BZ.  Always felt anxious but not managing it well.  Isolating. No SE gabapentin .  Did not stop it bc he forgot it. Plan: Increase gabapentin  to 300 BID and 600 HS for 2 days, then incrase to 2 in AM  And HS and 1 in middle of the day for 4 days, then increase to 600 TID.  For a week, if NR then increase to 900  TID.    02/08/2020 appointment with the following noted: Increased gabapentin  to 600 mg BID.  Has to nap if takes more.  No less anxious with the increase in dose.  No benefit to it. Asks about BZ.   Doesn't really want to leave the house and anxiety driving.  Can get anxious even in yard bc afraid of having to talk to people.  No paranoia. Dread of things.  Emotional eating. Plan: Switch to paroxetine  30 mg for better antianxiety effect and hopefully help depression a little more.  Start one half of  paroxetine  20 mg tablet and reduce duloxetine  to 2 of the 20 mg capsules for 5 days, Then increase paroxetine  to 1 tablet daily and reduce duloxetine  to 1 capsule daily for 5 days, Then increase to 1-1/2 paroxetine  tablets daily and stop duloxetine  Continue Abilify  5 mg augmentation for residual anhedonia.   03/08/2020 phone call patient stating he felt Paxil  was causing tremors in hands and feet and not helping with anxiety.  He was instructed to reduce paroxetine  to 20 mg daily and stop Abilify  for 3 days and then restart Abilify  1/2 tablet. 03/20/2020 phone call patient stating the above changes made no difference in tremor.  He was instructed to stop Abilify  and increase paroxetine  to 40 mg daily. 04/01/2020 patient called stating his PCP wanted him to stop taking paroxetine  completely to  see if the tremors would resolve. 05/01/2020 patient phone call stating he was diagnosed with Parkinson's disease but because of anxiety needed to be on some medication he was encouraged to restart the paroxetine  which had never had an adequate trial for his anxiety. 06/03/2020 phone call from patient complaining of panic attacks.  He had increase paroxetine  to 40 mg a day and was given hydroxyzine  as needed.  06/07/2020 urgent appointment with the following noted: Not well.  Depression as bad as ever and anxiety through the roof.  Worse beginning of the week bc feels completely overwhelming.   Getting OOB, brushing teeth is hard, being in public hard. Social avoidance affecting work.  Don't feel scared of PD but slap in the face about age.  Single and in middle of 5 year bankruptcy.  Trouble with concentration. Not suicidal but death thoughts. Yesterday a good day and laughed first time in a while. Poor appetite and lost weight. On paroxetine  40 mg for 3 weeks. No SE.   Trouble staying asleep. 4-5  Hours. Plan: Needs more time to respond to paroxetine  40 mg daily Deplin 15 mg daily for depression  With samples. Increase hydroxyzine  for ST anxiety.  07/10/2020 appointment with the following noted: Depression is better but anxiety is through the roof.  Can laugh again.  Less hopeless and paralyzed by depression.  Now 5/10 depression.  Anxiety is still 10/10.  Will have nausea going out.  Has managed the anxiety pretty well.  Wonders if PD is causing anxiety.  Over the last 2-3 years more social anxiety.   No SE with Paxil . Hydroxyzine  TID and 2 HS.  Lorazepam  helps but manages it. Plan: Increase paroxetine  60 mg daily.  It helped depression but not anxiety so far.  08/06/20 appt with following noted: Cant tell a difference.  Anxiety still up.  BP up with stress.  Takes Ativan  prn. Started hydroxyzine  75 HS and sleeping better.  If EMA then hard to get back to sleep.  Not anxious at night  typically.  Around people tense and heart races. I don't feel as depressed but no decorations for Xmas for the first time.  Typically after Jan 1 falls into depression and a bit anxious over it. Plan: Continue paroxetine  60 mg daily at least another month.  It helped depression but not anxiety so far. Increase propranolol  to 60-80 mg TID for anxiety  09/05/2020 appointment with following noted: Still awakens terrified of the world and hard to get OOB. Depression is still a bit of a problem. Going anywhere to deal with anyone makes him very anxious to the point of nausea.  When takes lorazepam  it eases it off rather than  eliminating it. Lorazepam  1 mg avg daily.  Really bad Xmas.  Even to the point of scared to go out with kids.  Once out he does ok usually. SE a little tired.  Not severe.   Hydroxyzine  during day and 75 mg at night for sleep and it helps. No libido. Dx PD so can't use risperidone Plan: Increase paroxetine  80 mg daily at least another month.  It helped depression but not anxiety so far.  10/10/2020 appointment with the following noted: Anxiety no better.  Hopeless and waiting to die.  No joy or relaxation around the corner. Lorazepam  helps otherwise shaking and sweating.  Cancelled appts yesterday.  Likes there's something inside the body that's twisting. Plan no med changes.  We will give paroxetine  80 mg another month or so to help.  11/06/2020 appointment with the following noted: I still have pit in stomach but anxiety is better with less shaking sweating, SOB.  Still don't want to go out much.  Still a lot of worry.  Once home in PM then can let go easier.  Goes to bed at 9 and sleeps good.  Normal function. No change in hopelessness.  No joy or looking forward.  I feel sad and lonely a good part of the time.  Enjoyed football game. Neuro yesterday doing well with early stage PD. Plan: Reduce paroxetine  by 50% every 2 weeks down to 20 mg and stay at 20 mg. Start Viibryd   10 mg daily with 350 calories for 1 week then increase to 20 mg daily  12/10/20 appt noted:  Problem with cost Viibryd . Continued with Viibryd  20 mg daily and down to paroxetine  20 mg daily without withdrawal.  The terror of the outside world continues.  Still anxious around most other people and going out. For the first time in a long while,  over the last week, he felt joy and connection and he even made other people laugh.  Had less anxiety around them than usual.  Less hopeless.  Managing work better. Found copay card and last RX $45.   No SE and no withdrawal.  Plan:  paroxetine   stay at 20 mg until the increase in Viibryd  can have benefit Increase Viibryd  with 350 calories to 40 mg daily DC Deplin due to no response   01/07/2021 appointment with the following noted: The fear of the world remains but significantly less hopeless.  Very anxious over bankruptcy hearing today.  18 mos left on dealing with that. Son graduated from law-school recently.  He got 2 awards.  Left after he walked across the stage.  Couldn't stay in the crowd later.  Ongoing panic with agoraphobia.   Less death thoughts but still some. Works from house and stays there.   Tolerated Viibryd  well.   NO SE Needs Klonopin  and hydroxyzine . Plan: paroxetine   stay at 20 mg until the increase in Viibryd  can have benefit continue Viibryd  with 350 calories to 40 mg daily  02/06/21 appt with following noted: Viibryd  40 and paxil  20. Stopped lithium  after 3 weeks DT diarrhea and NR. Managing but not great.  Not dramatic improvement but thinks overall it is better than with Paxil .  Less depressed than 6 mos ago.  But anxiety over normal activities even in grocery store or waiting room. Working hard.  Managing meetings.   Plan: Reduce Paxil  to 10 mg for 2 weeks and stop it Increase Viibryd  to 60 mg daily for better anxiety effects  04/03/2021 appointment with following noted:  Couldn't afford Viibryd  at $200/mo so dropped  down to 40 mg daily. More crying on Viibryd  even sobbing if not at work.  Feels very alone and hopeless.  Recurring dream disturbing with homesick feelings. Death thoughts without sui intent or plan.  Sleep good. Can do ok around people.  Able to respond to respond to positive stimuli. Plan:  Reduce Viibryd  to 1/2 tablet 20 mg and start nortriptyline  25 mg 1 at night for 5 nights then, Continue Viibryd  20 mg daily and increase nortriptyline  2 of the 25 mg capsules for 5 nights, then Stop Viibryd  and increase nortriptyline  to 3 of the 25 mg capsules nightly. Wait 1 week and get the blood test in the morning. Disc SE  05/19/2021 appointment with the following noted: Cautiously optimistic with change to nortriptyline .  No longer homesick feeling, more confident and less crying.  Still doesn't like going anywhere.  Trying to reduce clonazepam .  Last Saturday 60th BD and really down bc kids didn't come to surprise him..  Lonely. PE last week was good. Handling going out easier.   Been on nortriptyline  75 mg for a month. No full panic lately.  Still leaving house and driving causes anxiety. No SE except 2-3 nights of indigestion. Sleep is good. Plan: Lithium  augmentation option to try lower dose 150 bc diarrhea with 300 mg. Yes.  06/18/21 appt noted: Mood depression less severe without crying.  Anxiety still a problem.  Not happy or sad.  Nervous driving. Worrying PD may be worse. Clonazepam  1 mg does not cause sleepiness, dizziness. No particular effect of lihtium 150. Appetite reduced.   Propranolol  keeps BP down. Plan clonidine  trial for anxiety  07/30/2021 appt noted: Sees benefit with clonidine  0.1 mg BID.  More at ease.  Still can worry.  Takes clonazepam  prn.  Average 1.5 mg daily.  I feel better.  Less hopeless and enjoys things more. Often depression in January. SE constipation and dry mouth. Plan: Reduce propranolol  to 40 mg BID bc borderline low BP Continue clonidine  0.1 mg  twice daily which was helpful Continue lithium  150 nightly Continue lamotrigine  150 mg daily Continue nortriptyline  75 mg nightly Reduce propranolol  to 40 mg twice daily due to 2 borderline low blood pressure Continue Klonopin  1 mg twice daily Try to reduce hydroxyzine  to 25 mg 3 times daily and 25-50 nightly Continue gabapentin  Discussed referral for TMS  09/04/2021 appointment with the following noted: Covid 10 days ago first time.  Bad first 3 days. Reduced propranolol  and lisinopril  and now 1/2 tablet lisinopril . Feeling better despite Jan always terrible month for me.   Less fearful going  places but would prefer to be at home.   Plan: reduce hydroxyzine   to just prn 25 mg prn anxiety and it helps And 1-2 hs prn insomnia.  10/06/2021 appointment the following noted: Stopped hyrdroxyzine except prn going out. Lonely but not depressed other than mild seasonal. Terribly constipated using stool softener. Not much appetite and lost 30# over last 3-4 mos.  Not had to work at it. Anxiety is still better and managed generally.  Can still hget anxious in situations. Plan: Plan: Plan: No med changes propranolol  per PCP Continue clonidine  0.1 mg twice daily which was helpful Continue lithium  150 nightly Continue lamotrigine  150 mg daily Continue nortriptyline  75 mg nightly Continue Klonopin  1 mg twice daily prn hydroxyzine  to 25 mg 3 times daily and 25-50 nightly, use LED Continue gabapentin   12/03/2021 appointment with the following noted: Off gabapentin  and reduced  hydroxyzine . Stable usually.  More loneliness than depression.   Still anxious going anywhere but once there is fine. Little appetite and lost 40#. Some low BP lately.  Adjusting dose. Busy with work and a couple of close friends. Able to talk to kids more and longer now. SE dry mouth and ED Plan: no med changes  02/12/22 appt noted: Lost 50# with less appetite than in the past.  At 200# but plateaued. Back pain much  better. Off hydroxyzine  without problems. A weeks worth of rain and clouds caused depression. Resolved with better weather. Dating.  Lonely and still doesn't love being with people but does so. 3 very close friends.  That's enough.  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 Swamped with work and grateful for it.  10-11 big house projects. Dinner with sons tomorrow.  One is clinical research associate and other retired Arts Development Officer. No panic.  Residual anxiety with people.  Feels safer at home. SE dry mouth and constipation. Plan no med changes  05/18/22 appt noted: Even better, than in 5-10 years. 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 Continued social anxiety and avoids some things. Gradually reducing clonazepam .   SE dry mouth and constipation managed. Appetite is back. And gained back 5# gain. No longer has death thoughts. Dating.  Pleased.  Business is good. Sleep is good.   Pleased with meds. Able to reduce Parkinson's meds. Plan: propranolol  per PCP Continue clonidine  0.1 mg twice daily which was helpful for anxiety and BP Continue lithium  150 nightly Continue lamotrigine  150 mg daily Continue nortriptyline  75 mg nightly Continue Klonopin  1 mg twice daily  09/22/22 appt noted: Jan never a good month but 80% better than in the past. Anxiety still there a little but not severe.  Has to push himself to go to [employer] but will do so. Work is good.  Completed chapter 13 bankruptcy and it's behind him. Dating for about a year and it's going well. Has been up to [place] with her. I feel balanced.  Not on top of the world. Son graduated and starts ICU nurse. PE a month ago. Doesn't think he has Parkinson's.  Not taking tremor meds in 8 mos.   SE dry and constipation managed.    03/23/23 appt noted: No med changes Kind of flat but status quo. Some concerns about potential recession.  But he's generally busy. Some anticipatory anxiety but not  dep. Superficial dating relationship.  Worries about being alone with age.   Kids well.  Dog doing well. Went to [employer] and enjoyed it.  Clonidine  and propranolol  helping BP and anxiety Plan no changes  09/23/23 appt noted: Psych med: Clonazepam  1 mg twice daily, clonidine  0.1 mg twice daily, lamotrigine  150 twice daily, lithium  150 nightly, propranolol  40 twice daily, nortriptyline  75 nightly. Without propranolol  BP goes up.   Best Xmas with kids than in 20 years.  Saw [place]. Back pain.  Shot didn't help much.  Stiffness in hands, thighs;  walking is stiff.  Pending neurologist.  He's nervous about it. Close friend with cancer.   Mood and anxiety is pretty stable .  No panic.   Enjoys kids and dating relationship.   No SI anymore but does have death thoughts some. No SE problems. Plan no med changes  03/22/24 appt noted:  Psych med: Clonazepam  1 mg twice daily, clonidine  0.1 mg twice daily, lamotrigine  150 twice daily, lithium   150 AM, propranolol  40 twice daily, nortriptyline  75 nightly. Interesting 6 mos.  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  Prednisone taper.  Took 10 mg and tapering 1 mg per month and so far ok.   Split up with GF.   8 weeks ago fell down the steps and hit back of head.  LOC 10 mins. Dx post-concussive syndrome and as anxious and dep as I've ever been. Was not dep before the concussion.  Can only work 4-5 hours daily but that is an improvement.  Focus and conc is not good.  Feels very alone.   AA supportive.  Has seen neuro about this. At times don't eat bc scared to go to grocery bc anxiety and seems overwhelming.   More irritable and emotional.  Alone and scared. Plan: Increase nortriptyline  to 100mg  HS.  04/04/24 TC:  Pt currently taking clonazepam  1 mg BID. He feels that at AA he is outside the bubble by being on benzo. He feels it is not conductive to his recovery to continue the clonazepam . He said he had discussed with you previously and that this would not be a  quick process, but he would like to start it. He said he doesn't know what will happen with his anxiety after coming off the clonazepam , but said he wants something besides benzos to deal with it if needed.     MD resp:  Ok.  Reduce clonazepam  to 1 mg AM and 0.5 mg PM for 3-4 weeks then reduce to 0.5 mg BID.  He can reduce either the am or pm choice first, his choice.  But not faster than 0.5 mg reduction per day every 3-4 weeks. Adam Macintosh, MD, DFAPA      05/11/24 appt noted:  Med: reduced Clonazepam  1 mg HS, clonidine  0.1 mg twice daily, lamotrigine  150 twice daily, lithium  150 AM, propranolol  40 twice daily, nortriptyline  100 nightly. No SE  Better than in 10 years.   Broke up with GF mid July and crashed emotionally and was smoking pot and SI.  Dep and anxiety was worse since Covid when also was not attending AA.  Got back in AA 6 weeks ago and life is better  by a lot.  Quit pot and wants to come off BZ.  Dep is better.   More comfortable with himself.  Walking 3 miles daily Had concussion since from a fall.    Post concussion sx better including fog, forgetfulness, excessive sleep,  Anxiety reduced dramatically Clonidine  is helping the BP too and anxiety.   Plan: Per his request wean clonazepam  0.5 mg every 3-4 weeks.  08/21/24 appt noted:  Med: stopped Clonazepam   mg HS, clonidine  0.1 mg twice daily, lamotrigine  150 twice daily, lithium  150 AM, propranolol  40 twice daily, nortriptyline  100 nightly. SE severe dry mouth Tapered off clonazepam  over 2 mos and no off for 2 mos. Going very good.  No px with taper BZ. Seeing Haroldine Salt. Overall feeling very good.  Usually dep in January.  Usually seasonal dep nonexistent. Engaged in Recovery full on and that has been helpful.  More renewed life and enjoying it and having peace.   Working program more deeply is freeing.   Sober.  Meditations and prayers in AM Anxiety is managed. Pending shoulder replacement in next couple of weeks. Wants  to taper off clonidine  DT dry mouth and is better.  Doing AA by internet.  Sober since 2006.  No history of BZ dependence.  Past Psychiatric  Medication Trials:  Brief duloxetine , sertraline  200, paroxetine  80, Viibryd  40,  nortriptyline  75 buspirone dizzy, gabapentin ,  Lorazepam  , clonazepam  is better Hydroxyzine   Propranolol  40 BID Clonidine  0.1 BID Lithium  300 diarrhea Lamotrigine  150 Abilify  helped the depression and sense of dread but nothing for anxiety. Dx PD so can't use risperidone  Review of Systems:   Review of Systems  Constitutional:  Positive for unexpected weight change. Negative for fatigue.  Cardiovascular:  Negative for palpitations.  Gastrointestinal:  Positive for constipation. Negative for diarrhea and nausea.  Musculoskeletal:  Positive for back pain. Negative for arthralgias.  Neurological:  Negative for dizziness and tremors.  Psychiatric/Behavioral:  Negative for agitation, behavioral problems, confusion, decreased concentration, dysphoric mood, hallucinations, self-injury, sleep disturbance and suicidal ideas. The patient is nervous/anxious. The patient is not hyperactive.     Medications: I have reviewed the patient's current medications.  Current Outpatient Medications  Medication Sig Dispense Refill   aspirin  EC 81 MG tablet Take 1 tablet (81 mg total) by mouth 2 (two) times daily. 60 tablet 0   clonazePAM  (KLONOPIN ) 1 MG tablet Take 1 tablet (1 mg total) by mouth 2 (two) times daily. 60 tablet 5   cloNIDine  (CATAPRES ) 0.1 MG tablet TAKE 1 TABLET(0.1 MG) BY MOUTH TWICE DAILY 180 tablet 1   lamoTRIgine  (LAMICTAL ) 150 MG tablet Take 1 tablet (150 mg total) by mouth daily. 90 tablet 1   lithium  carbonate 150 MG capsule TAKE 1 CAPSULE(150 MG) BY MOUTH DAILY 90 capsule 1   meclizine  (ANTIVERT ) 25 MG tablet Take 1 tablet (25 mg total) by mouth 3 (three) times daily as needed for dizziness. 30 tablet 0   meloxicam (MOBIC) 15 MG tablet Take 15 mg by mouth  daily.     methocarbamol  (ROBAXIN ) 500 MG tablet Take 500 mg by mouth 3 (three) times daily.     Multiple Vitamins-Minerals (MULTIVITAMIN WITH MINERALS) tablet Take 1 tablet by mouth daily.     nortriptyline  (PAMELOR ) 50 MG capsule TAKE 2 CAPSULES BY MOUTH AT BEDTIME 180 capsule 0   omeprazole (PRILOSEC) 20 MG capsule Take 20 mg by mouth 2 (two) times daily.     predniSONE (DELTASONE) 5 MG tablet Take 5 mg by mouth daily.     propranolol  (INDERAL ) 40 MG tablet Take 1 tablet (40 mg total) by mouth 2 (two) times daily. 180 tablet 0   simvastatin  (ZOCOR ) 40 MG tablet Take 40 mg by mouth daily.     tadalafil  (CIALIS ) 20 MG tablet TAKE 1 TABLET(20 MG) BY MOUTH DAILY AS NEEDED FOR ERECTILE DYSFUNCTION 30 tablet 3   No current facility-administered medications for this visit.    Medication Side Effects: None  Allergies: No Known Allergies  Past Medical History:  Diagnosis Date   Alcoholism (HCC)    sober since 01-2011    Anxiety    Arthritis    Balance problem    Complication of anesthesia    unsuccessful spinal with right knee replacement, used general anesthesia   Depression    Difficulty concentrating    Diverticulosis 2012   GERD (gastroesophageal reflux disease)    Hemorrhoids    Hyperlipidemia    Hypertension    Internal hemorrhoids 2012   Neuromuscular disorder (HCC)    Parkinson   Obsessive compulsive disorder    Osteoarthritis    Rotator cuff tear, left    Tear of right rotator cuff 08/03/2014   Wears contact lenses     Family History  Problem Relation Age of Onset  Stroke Mother    Diabetes Mother    Cancer Father    Parkinson's disease Maternal Grandmother    Colon cancer Neg Hx    Colon polyps Neg Hx    Esophageal cancer Neg Hx    Stomach cancer Neg Hx    Rectal cancer Neg Hx     Social History   Socioeconomic History   Marital status: Divorced    Spouse name: Not on file   Number of children: 3   Years of education: Not on file   Highest education  level: Bachelor's degree (e.g., BA, AB, BS)  Occupational History   Occupation: Tree Surgeon  Tobacco Use   Smoking status: Former    Types: E-cigarettes, Cigarettes    Quit date: 08/01/2009    Years since quitting: 15.0   Smokeless tobacco: Never  Vaping Use   Vaping status: Every Day   Start date: 08/12/2014  Substance and Sexual Activity   Alcohol use: No    Comment: not since 2006   Drug use: No   Sexual activity: Not on file    Comment: uses e-cig  Other Topics Concern   Not on file  Social History Narrative   Lives alone   No caffeine   Social Drivers of Health   Tobacco Use: Medium Risk (08/21/2024)   Patient History    Smoking Tobacco Use: Former    Smokeless Tobacco Use: Never    Passive Exposure: Not on Actuary Strain: Not on file  Food Insecurity: Not on file  Transportation Needs: Not on file  Physical Activity: Not on file  Stress: Not on file  Social Connections: Unknown (12/28/2021)   Received from [facility]   Social Network    Social Network: Not on file  Intimate Partner Violence: Unknown (11/19/2021)   Received from [facility]   HITS    Physically Hurt: Not on file    Insult or Talk Down To: Not on file    Threaten Physical Harm: Not on file    Scream or Curse: Not on file  Depression (EYV7-0): Not on file  Alcohol Screen: Not on file  Housing: Not on file  Utilities: Not on file  Health Literacy: Not on file    Past Medical History, Surgical history, Social history, and Family history were reviewed and updated as appropriate.   Please see review of systems for further details on the patient's review from today.   Objective:   Physical Exam:  There were no vitals taken for this visit.  Physical Exam Constitutional:      General: He is not in acute distress. Musculoskeletal:        General: No deformity.  Neurological:     Mental Status: He is alert and oriented to person, place, and time.     Cranial  Nerves: No dysarthria.     Coordination: Coordination normal.  Psychiatric:        Attention and Perception: Attention and perception normal. He does not perceive auditory or visual hallucinations.        Mood and Affect: Mood is not anxious or depressed. Affect is not labile, blunt, angry or inappropriate.        Speech: Speech normal.        Behavior: Behavior normal. Behavior is cooperative.        Thought Content: Thought content normal. Thought content is not paranoid or delusional. Thought content does not include homicidal or suicidal ideation. Thought content does not  include suicidal plan.        Cognition and Memory: Cognition and memory normal.        Judgment: Judgment normal.     Comments: Talkative. severe anxiety much better and depression is improved but still some somatic anxiety. Gets joy out of things.     Lab Review:     Component Value Date/Time   NA 135 01/29/2024 2136   K 3.8 01/29/2024 2136   CL 102 01/29/2024 2136   CO2 22 01/29/2024 2136   GLUCOSE 113 (H) 01/29/2024 2136   BUN 30 (H) 01/29/2024 2136   CREATININE 1.00 01/29/2024 2136   CALCIUM 9.3 01/29/2024 2136   PROT 7.0 01/29/2024 2136   ALBUMIN 3.8 01/29/2024 2136   AST 30 01/29/2024 2136   ALT 30 01/29/2024 2136   ALKPHOS 54 01/29/2024 2136   BILITOT 0.7 01/29/2024 2136   GFRNONAA >60 01/29/2024 2136   GFRAA >60 08/02/2018 0458       Component Value Date/Time   WBC 10.0 01/29/2024 2136   RBC 4.19 (L) 01/29/2024 2136   HGB 13.2 01/29/2024 2136   HCT 38.0 (L) 01/29/2024 2136   PLT 229 01/29/2024 2136   MCV 90.7 01/29/2024 2136   MCH 31.5 01/29/2024 2136   MCHC 34.7 01/29/2024 2136   RDW 13.0 01/29/2024 2136   LYMPHSABS 1.3 01/29/2024 2136   MONOABS 0.6 01/29/2024 2136   EOSABS 0.1 01/29/2024 2136   BASOSABS 0.1 01/29/2024 2136    No results found for: POCLITH, LITHIUM    No results found for: PHENYTOIN, PHENOBARB, VALPROATE, CBMZ   04/23/2021 nortriptyline  level 113.  No  med change indicated.  On nortriptyline  75 mg daily  .res Assessment: Plan:    Adam Harvey was seen today for follow-up, depression, anxiety and medication problem.  Diagnoses and all orders for this visit:  Moderately severe recurrent major depression (HCC)  Panic disorder with agoraphobia  GAD (generalized anxiety disorder)  PTSD (post-traumatic stress disorder)  Social anxiety disorder  Alcohol dependence, in remission (HCC)    30 min face to face time with patient was spent on counseling and coordination of care. We discussed Patient stated he has been on antidepressants very long time and PREVIOUSLY wondered what his emotional state would be like off the medication.  This was partly influenced by online patient for him to described problems with withdrawal coming off of SSRIs.  He successfully came off antidepressant without withdrawal symptoms.  But then had relapse of both depression and anxiety within a few weeks off the antidepressant.  SX were severe and persistent but then dep AND anxiety have largely resolved with current med regimen.  Depression is OK and Anxiety is better markedly with clonidine .  Overall much better than years.   Residual social anxiety and not fully confident but functions as needed.  Clonidine   0.1 mg BID was helpful for anxiety and BP. Propranolol  also helps. When ready then taper off clonidine  per his request over 3 weeks.   Disc risk BP going off this.  Consider lithium , mirtazapine , Remeron , trazodone, Deplin .   poss change to [facility] for further improvement.  He doesn't want to risk the change Continue Lithium  augmentation 150 bc diarrhea with 300 mg.   Switch to nortriptyline  75 HS led to improvement and doing well until concussion.   Increased nortriptyline  to 100mg  HS. 04/23/2021 nortriptyline  level 113.  No med change indicated.  On nortriptyline  75 mg daily Disc SE.  Avoid sugar Using fiber and Dulcolax  Use  LED or stop if possible.  hydroxyzine   to just prn 25 mg prn anxiety and it helps And 1-2 hs prn insomnia.  Discussed his chronic thoughts about death. They resolved so far.  He commits to safety and is not having suicidal thoughts.  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   Disc BZ.  Some existential issues.    Disc CBT dealing with bucket list things.  So much to be grateful for.   Extensive discussion of how Covid has allowed social anxiety to get worse but is functional.   Genesight DT TR sx.  Reviewed results at length previously  Support continued sobriety.  Continue AA.   Counseling 25 min around recent issues with THC .  Sobriety.  Recent severe dep with SI that is improving and how to address should it recur. Discussed safety plan at length with patient.  Advised patient to contact office with any worsening signs and symptoms.  Instructed patient to go to the [facility] emergency room for evaluation if experiencing any acute safety concerns, to include suicidal intent.  Rec continue counseling for therapy.  Disc ED meds.  Disc vitamin D in detail.  Vitamin D 5000 units daily  Extensive discussion about  Constipation management 1.  Lots of water  2.  Powdered fiber supplement such as MiraLAX , Citrucel, etc. preferably with a meal 3.  2 stool softeners a day 4.  Milk of magnesia or magnesium tablets if needed  Disc concerns about polypharmacy which seems unavoidable at present Option lamotrigine  increase if needed. Rec it if seasonal or weather worsening mood.  Consider fish oil and vitamin E.  2 fish oil daily with  highest dose of vitamin E you find which might help healing from concussion.   Plan:  propranolol  per PCP helps anxiety and BP  Clonidine   0.1 mg BID was helpful for anxiety and BP. Propranolol  also helps. When ready then taper off clonidine  per his request over 3 weeks.   Disc risk BP going off this.  Continue lithium  150 nightly Continue lamotrigine  150 mg  daily Continue nortriptyline  100 mg  Per his request wean clonazepam  0.5 mg every 3-4 weeks.  Follow-up  weeks  Adam Macintosh, MD, DFAPA     Please see After Visit Summary for patient specific instructions.  No future appointments.     No orders of the defined types were placed in this encounter.      -------------------------------

## 2024-09-10 ENCOUNTER — Other Ambulatory Visit: Payer: Self-pay | Admitting: Psychiatry

## 2024-09-10 DIAGNOSIS — F332 Major depressive disorder, recurrent severe without psychotic features: Secondary | ICD-10-CM

## 2024-09-12 NOTE — Progress Notes (Signed)
 Sent message, via epic in basket, requesting orders in epic from Careers adviser.

## 2024-09-13 NOTE — Patient Instructions (Addendum)
 SURGICAL WAITING ROOM VISITATION  Patients having surgery or a procedure may have no more than 2 support people in the waiting area - these visitors may rotate.    Children ages 22 and under will not be able to visit patients in [facility] under most circumstances.   Visitors with respiratory illnesses are discouraged from visiting and should remain at home.  If the patient needs to stay at the hospital during part of their recovery, the visitor guidelines for inpatient rooms apply. Pre-op nurse will coordinate an appropriate time for 1 support person to accompany patient in pre-op.  This support person may not rotate.    Please refer to the [facility] website for the visitor guidelines for Inpatients (after your surgery is over and you are in a regular room).       Your procedure is scheduled on: 09/26/24   Report to [facility] Main Entrance    Report to admitting at 12:00 PM   Call this number if you have problems the morning of surgery [phone]   Do not eat food :After Midnight.   After Midnight you may have the following liquids until 11:30 AM DAY OF SURGERY  Water  Non-Citrus Juices (without pulp, NO RED-Apple, White grape, White cranberry) Black Coffee (NO MILK/CREAM OR CREAMERS, sugar ok)  Clear Tea (NO MILK/CREAM OR CREAMERS, sugar ok) regular and decaf                             Plain Jell-O (NO RED)                                           Fruit ices (not with fruit pulp, NO RED)                                     Popsicles (NO RED)                                                               Sports drinks like Gatorade (NO RED)                  The day of surgery:  Drink ONE (1) Pre-Surgery Clear Ensure at 11:30 AM the morning of surgery. Drink in one sitting. Do not sip.  This drink was given to you during your hospital  pre-op appointment visit. Nothing else to drink after completing the  Pre-Surgery Clear Ensure.          If you  have questions, please contact your surgeons office.   FOLLOW BOWEL PREP AND ANY ADDITIONAL PRE OP INSTRUCTIONS YOU RECEIVED FROM YOUR SURGEON'S OFFICE!!!     Oral Hygiene is also important to reduce your risk of infection.                                    Remember - BRUSH YOUR TEETH THE MORNING OF SURGERY WITH YOUR REGULAR TOOTHPASTE  DENTURES WILL BE REMOVED PRIOR TO SURGERY  PLEASE DO NOT APPLY Poly grip OR ADHESIVES!!!   Stop all vitamins and herbal supplements 7 days before surgery.   Take these medicines the morning of surgery with A SIP OF WATER :  Clonidine  (Catapress) Lamotrigine  (Lamictal ) Lithium   Omeprazole (Prilosec) Prednisone (Deltasone) Propranolol  (Inderal ) Simvastatin  (Zocor )             You may not have any metal on your body including jewelry, and body piercing             Do not wear lotions, powders, cologne, or deodorant              Men may shave face and neck.   Do not bring valuables to the hospital. [facility] IS NOT             RESPONSIBLE   FOR VALUABLES.   Contacts, glasses, dentures or bridgework may not be worn into surgery.  DO NOT BRING YOUR HOME MEDICATIONS TO THE HOSPITAL. PHARMACY WILL DISPENSE MEDICATIONS LISTED ON YOUR MEDICATION LIST TO YOU DURING YOUR ADMISSION IN THE HOSPITAL!    Patients discharged on the day of surgery will not be allowed to drive home.  Someone NEEDS to stay with you for the first 24 hours after anesthesia.   Special Instructions: Bring a copy of your healthcare power of attorney and living will documents the day of surgery if you haven't scanned them before.              Please read over the following fact sheets you were given: IF YOU HAVE QUESTIONS ABOUT YOUR PRE-OP INSTRUCTIONS PLEASE CALL [phone].   If you received a COVID test during your pre-op visit  it is requested that you wear a mask when out in public, stay away from anyone that may not be feeling well and notify your surgeon if you  develop symptoms. If you test positive for Covid or have been in contact with anyone that has tested positive in the last 10 days please notify you surgeon.      Pre-operative 4 CHG Bath Instructions  DYNA-Hex 4 Chlorhexidine  Gluconate 4% Solution Antiseptic 4 fl. oz   You can play a key role in reducing the risk of infection after surgery. Your skin needs to be as free of germs as possible. You can reduce the number of germs on your skin by washing with CHG (chlorhexidine  gluconate) soap before surgery. CHG is an antiseptic soap that kills germs and continues to kill germs even after washing.   DO NOT use if you have an allergy to chlorhexidine /CHG or antibacterial soaps. If your skin becomes reddened or irritated, stop using the CHG and notify one of our RNs at   Please shower with the CHG soap starting 4 days before surgery using the following schedule:     Please keep in mind the following:  DO NOT shave, including legs and underarms, starting the day of your first shower.   You may shave your face at any point before/day of surgery.  Place clean sheets on your bed the day you start using CHG soap. Use a clean washcloth (not used since being washed) for each shower. DO NOT sleep with pets once you start using the CHG.  CHG Shower Instructions:  If you choose to wash your hair and private area, wash first with your normal shampoo/soap.  After you use shampoo/soap, rinse your hair and body thoroughly to remove shampoo/soap residue.  Turn the water  OFF and apply about 3  tablespoons (45 ml) of CHG soap to a CLEAN washcloth.  Apply CHG soap ONLY FROM YOUR NECK DOWN TO YOUR TOES (washing for 3-5 minutes)  DO NOT use CHG soap on face, private areas, open wounds, or sores.  Pay special attention to the area where your surgery is being performed.  If you are having back surgery, having someone wash your back for you may be helpful. Wait 2 minutes after CHG soap is applied, then you may rinse  off the CHG soap.  Pat dry with a clean towel  Put on clean clothes/pajamas   If you choose to wear lotion, please use ONLY the CHG-compatible lotions on the back of this paper.     Additional instructions for the day of surgery: DO NOT APPLY any lotions, deodorants, cologne, or perfumes.   Put on clean/comfortable clothes.  Brush your teeth.  Ask your nurse before applying any prescription medications to the skin.   CHG Compatible Lotions   Aveeno Moisturizing lotion  Cetaphil Moisturizing Cream  Cetaphil Moisturizing Lotion  Clairol Herbal Essence Moisturizing Lotion, Dry Skin  Clairol Herbal Essence Moisturizing Lotion, Extra Dry Skin  Clairol Herbal Essence Moisturizing Lotion, Normal Skin  Curel Age Defying Therapeutic Moisturizing Lotion with Alpha Hydroxy  Curel Extreme Care Body Lotion  Curel Soothing Hands Moisturizing Hand Lotion  Curel Therapeutic Moisturizing Cream, Fragrance-Free  Curel Therapeutic Moisturizing Lotion, Fragrance-Free  Curel Therapeutic Moisturizing Lotion, Original Formula  Eucerin Daily Replenishing Lotion  Eucerin Dry Skin Therapy Plus Alpha Hydroxy Crme  Eucerin Dry Skin Therapy Plus Alpha Hydroxy Lotion  Eucerin Original Crme  Eucerin Original Lotion  Eucerin Plus Crme Eucerin Plus Lotion  Eucerin TriLipid Replenishing Lotion  Keri Anti-Bacterial Hand Lotion  Keri Deep Conditioning Original Lotion Dry Skin Formula Softly Scented  Keri Deep Conditioning Original Lotion, Fragrance Free Sensitive Skin Formula  Keri Lotion Fast Absorbing Fragrance Free Sensitive Skin Formula  Keri Lotion Fast Absorbing Softly Scented Dry Skin Formula  Keri Original Lotion  Keri Skin Renewal Lotion Keri Silky Smooth Lotion  Keri Silky Smooth Sensitive Skin Lotion  Nivea Body Creamy Conditioning Oil  Nivea Body Extra Enriched Lotion  Nivea Body Original Lotion  Nivea Body Sheer Moisturizing Lotion Nivea Crme  Nivea Skin Firming Lotion  NutraDerm 30 Skin  Lotion  NutraDerm Skin Lotion  NutraDerm Therapeutic Skin Cream  NutraDerm Therapeutic Skin Lotion  ProShield Protective Hand Cream  Provon moisturizing lotion  [place]- Preparing for Total Shoulder Arthroplasty    Before surgery, you can play an important role. Because skin is not sterile, your skin needs to be as free of germs as possible. You can reduce the number of germs on your skin by using the following products. Benzoyl Peroxide Gel Reduces the number of germs present on the skin Applied twice a day to shoulder area starting two days before surgery    ==================================================================  Please follow these instructions carefully:  BENZOYL PEROXIDE 5% GEL  Please do not use if you have an allergy to benzoyl peroxide.   If your skin becomes reddened/irritated stop using the benzoyl peroxide.  Starting two days before surgery, apply as follows: Apply benzoyl peroxide in the morning and at night. Apply after taking a shower. If you are not taking a shower clean entire shoulder front, back, and side along with the armpit with a clean wet washcloth.  Place a quarter-sized dollop on your shoulder and rub in thoroughly, making sure to cover the front, back, and side of your shoulder, along  with the armpit.   2 days before ____ AM   ____ PM              1 day before ____ AM   ____ PM                         Do this twice a day for two days.  (Last application is the night before surgery, AFTER using the CHG soap as described below).  Do NOT apply benzoyl peroxide gel on the day of surgery.  Incentive Spirometer  An incentive spirometer is a tool that can help keep your lungs clear and active. This tool measures how well you are filling your lungs with each breath. Taking long deep breaths may help reverse or decrease the chance of developing breathing (pulmonary) problems (especially infection) following: A long period of time when you are  unable to move or be active. BEFORE THE PROCEDURE  If the spirometer includes an indicator to show your best effort, your nurse or respiratory therapist will set it to a desired goal. If possible, sit up straight or lean slightly forward. Try not to slouch. Hold the incentive spirometer in an upright position. INSTRUCTIONS FOR USE  Sit on the edge of your bed if possible, or sit up as far as you can in bed or on a chair. Hold the incentive spirometer in an upright position. Breathe out normally. Place the mouthpiece in your mouth and seal your lips tightly around it. Breathe in slowly and as deeply as possible, raising the piston or the ball toward the top of the column. Hold your breath for 3-5 seconds or for as long as possible. Allow the piston or ball to fall to the bottom of the column. Remove the mouthpiece from your mouth and breathe out normally. Rest for a few seconds and repeat Steps 1 through 7 at least 10 times every 1-2 hours when you are awake. Take your time and take a few normal breaths between deep breaths. The spirometer may include an indicator to show your best effort. Use the indicator as a goal to work toward during each repetition. After each set of 10 deep breaths, practice coughing to be sure your lungs are clear. If you have an incision (the cut made at the time of surgery), support your incision when coughing by placing a pillow or rolled up towels firmly against it. Once you are able to get out of bed, walk around indoors and cough well. You may stop using the incentive spirometer when instructed by your caregiver.  RISKS AND COMPLICATIONS Take your time so you do not get dizzy or light-headed. If you are in pain, you may need to take or ask for pain medication before doing incentive spirometry. It is harder to take a deep breath if you are having pain. AFTER USE Rest and breathe slowly and easily. It can be helpful to keep track of a log of your progress. Your  caregiver can provide you with a simple table to help with this. If you are using the spirometer at home, follow these instructions: SEEK MEDICAL CARE IF:  You are having difficultly using the spirometer. You have trouble using the spirometer as often as instructed. Your pain medication is not giving enough relief while using the spirometer. You develop fever of 100.5 F (38.1 C) or higher. SEEK IMMEDIATE MEDICAL CARE IF:  You cough up bloody sputum that had not been present before. You  develop fever of 102 F (38.9 C) or greater. You develop worsening pain at or near the incision site. MAKE SURE YOU:  Understand these instructions. Will watch your condition. Will get help right away if you are not doing well or get worse. Document Released: 12/14/2006 Document Revised: 10/26/2011 Document Reviewed: 02/14/2007 [facility] Patient Information 2014 [place], [place].   ________________________________________________________________________

## 2024-09-13 NOTE — Progress Notes (Signed)
 Date of COVID positive in last 90 days:  PCP - Charlie Love, MD Cardiologist -  Neurologist- Eduard Hanlon, MD  CT chest- 01/23/24 [facility] Chest x-ray - [name] EKG - 01/31/24 Epic Stress Test - [name] ECHO - [name] Cardiac Cath - [name] Pacemaker/ICD device last checked:[name] Spinal Cord Stimulator:[name]  Bowel Prep - [name]  Sleep Study - [name] CPAP -   Fasting Blood Sugar - [name] Checks Blood Sugar _____ times a day  Last dose of GLP1 agonist-  [name] GLP1 instructions:  Do not take after     Last dose of SGLT-2 inhibitors-  [name] SGLT-2 instructions:  Do not take after     Blood Thinner Instructions: [name] Last dose:   Time: Aspirin  Instructions: ASA 81, hold 7 days Last Dose:  Activity level: Can go up a flight of stairs and perform activities of daily living without stopping and without symptoms of chest pain or shortness of breath.  Anesthesia review: [name]  Patient denies shortness of breath, fever, cough and chest pain at PAT appointment  Patient verbalized understanding of instructions that were given to them at the PAT appointment. Patient was also instructed that they will need to review over the PAT instructions again at home before surgery.

## 2024-09-14 ENCOUNTER — Encounter (HOSPITAL_COMMUNITY): Payer: Self-pay

## 2024-09-14 ENCOUNTER — Other Ambulatory Visit: Payer: Self-pay

## 2024-09-14 ENCOUNTER — Encounter (HOSPITAL_COMMUNITY)
Admission: RE | Admit: 2024-09-14 | Discharge: 2024-09-14 | Disposition: A | Discharge status: Home / Self Care | Source: Ambulatory Visit | Attending: Orthopedic Surgery | Admitting: Orthopedic Surgery

## 2024-09-14 VITALS — BP 142/86 | HR 66 | Temp 97.5°F | Resp 16 | Ht 72.0 in | Wt 234.0 lb

## 2024-09-14 DIAGNOSIS — Z01818 Encounter for other preprocedural examination: Secondary | ICD-10-CM

## 2024-09-14 LAB — CBC
HCT: 40.8 % (ref 39.0–52.0)
Hemoglobin: 13.4 g/dL (ref 13.0–17.0)
MCH: 31.5 pg (ref 26.0–34.0)
MCHC: 32.8 g/dL (ref 30.0–36.0)
MCV: 96 fL (ref 80.0–100.0)
Platelets: 221 10*3/uL (ref 150–400)
RBC: 4.25 MIL/uL (ref 4.22–5.81)
RDW: 12.9 % (ref 11.5–15.5)
WBC: 8.8 10*3/uL (ref 4.0–10.5)
nRBC: 0 % (ref 0.0–0.2)

## 2024-09-14 LAB — BASIC METABOLIC PANEL WITH GFR
Anion gap: 9 (ref 5–15)
BUN: 28 mg/dL — ABNORMAL HIGH (ref 8–23)
CO2: 27 mmol/L (ref 22–32)
Calcium: 9.9 mg/dL (ref 8.9–10.3)
Chloride: 102 mmol/L (ref 98–111)
Creatinine, Ser: 1.09 mg/dL (ref 0.61–1.24)
GFR, Estimated: >60 mL/min
Glucose, Bld: 110 mg/dL — ABNORMAL HIGH (ref 70–99)
Potassium: 4.7 mmol/L (ref 3.5–5.1)
Sodium: 138 mmol/L (ref 135–145)

## 2024-09-14 LAB — SURGICAL PCR SCREEN
MRSA, PCR: NEGATIVE
Staphylococcus aureus: NEGATIVE

## 2024-09-15 NOTE — H&P (Cosign Needed)
 SHOULDER ARTHROPLASTY ADMISSION H&P  Patient ID: Adam Harvey MRN: [id number] DOB/AGE: 1961-07-19 64 y.o.  Chief Complaint: left shoulder pain.  Planned Procedure Date: 09/27/23 Medical Clearance by Dr. Shepard   Dr. Alverda (rheumatology)  HPI: Adam Harvey is a 64 y.o. male who presents for evaluation of left shoulder pain. The patient has a history of pain and functional disability in the left shoulder and has failed non-surgical conservative treatments for greater than 12 weeks to include NSAID's and/or analgesics, corticosteriod injections, and activity modification.  Onset of symptoms was abrupt, starting 3 months ago with stable course since that time. The patient noted previous rotator cuff repair on the left shoulder.  Patient currently rates pain at 4 out of 10 with activity. Patient has worsening of pain with activity and weight bearing and pain that interferes with activities of daily living. There is no active infection.  Past Medical History:  Diagnosis Date   Alcoholism (HCC)    sober since 01-2011    Anxiety    Arthritis    Balance problem    Complication of anesthesia    unsuccessful spinal with right knee replacement, used general anesthesia   Depression    Difficulty concentrating    Diverticulosis 2012   GERD (gastroesophageal reflux disease)    Hemorrhoids    Hyperlipidemia    Hypertension    Internal hemorrhoids 2012   Neuromuscular disorder (HCC)    misdiagnosis per pt   Obsessive compulsive disorder    Osteoarthritis    Rotator cuff tear, left    Tear of right rotator cuff 08/03/2014   Wears contact lenses    Past Surgical History:  Procedure Laterality Date   ABCESS DRAINAGE  2012   nasal cyst   ACHILLES TENDON REPAIR  2008   right   ARTHOSCOPIC ROTAOR CUFF REPAIR Right 08/03/2014   Procedure: ARTHROSCOPIC ROTATOR CUFF REPAIR;  Surgeon: Fonda SHAUNNA Olmsted, MD;  Location: [facility];  Service: Orthopedics;  Laterality: Right;    COLONOSCOPY  2012   INGUINAL HERNIA REPAIR     right as infant   KNEE ARTHROSCOPY Left    x4   KNEE SURGERY     x3-right   ROTATOR CUFF REPAIR Left 2011    x 2   TONSILLECTOMY     TOTAL KNEE ARTHROPLASTY Right 12/2017   debride scar tissue   TOTAL KNEE ARTHROPLASTY Right 08/20/2017   Procedure: TOTAL KNEE ARTHROPLASTY;  Surgeon: Shari Sieving, MD;  Location: [facility];  Service: Orthopedics;  Laterality: Right;   TOTAL KNEE ARTHROPLASTY Left 08/01/2018   Procedure: TOTAL KNEE ARTHROPLASTY;  Surgeon: Liam Lerner, MD;  Location: [facility];  Service: Orthopedics;  Laterality: Left;  Adductor Block   TYMPANOSTOMY TUBE PLACEMENT     as child, several times   Allergies[1] Prior to Admission medications  Medication Sig Start Date End Date Taking? Authorizing Provider  amoxicillin -clavulanate (AUGMENTIN ) 875-125 MG tablet Take 1 tablet by mouth 2 (two) times daily. Last dose will be on 09/12/24 per pt 09/06/24  Yes [provider]  aspirin  EC 81 MG tablet Take 1 tablet (81 mg total) by mouth 2 (two) times daily. Patient taking differently: Take 81 mg by mouth daily. 08/01/18  Yes Orlando Camellia POUR, PA-C  CALCIUM PO Take 1,200 mg by mouth daily.   Yes [provider]  cloNIDine  (CATAPRES ) 0.1 MG tablet TAKE 1 TABLET(0.1 MG) BY MOUTH TWICE DAILY Patient taking differently: Take 0.1 mg by mouth daily. 03/22/24  Yes  Cottle, Lorene KANDICE Raddle., MD  hydroxychloroquine (PLAQUENIL) 200 MG tablet Take 400 mg by mouth daily.   Yes [provider]  lamoTRIgine  (LAMICTAL ) 150 MG tablet Take 1 tablet (150 mg total) by mouth daily. 03/22/24  Yes Cottle, Lorene KANDICE Raddle., MD  lithium  carbonate 150 MG capsule TAKE 1 CAPSULE BY MOUTH DAILY 09/10/24  Yes Cottle, Lorene KANDICE Raddle., MD  meloxicam (MOBIC) 15 MG tablet Take 15 mg by mouth daily. 01/22/20  Yes [provider]  Multiple Vitamins-Minerals (MULTIVITAMIN WITH MINERALS) tablet Take 1 tablet by mouth daily.   Yes [provider]   nortriptyline  (PAMELOR ) 50 MG capsule TAKE 2 CAPSULES BY MOUTH AT BEDTIME 09/10/24  Yes Cottle, Lorene KANDICE Raddle., MD  olmesartan (BENICAR) 20 MG tablet Take 20 mg by mouth daily. 07/12/24  Yes [provider]  omeprazole (PRILOSEC) 20 MG capsule Take 20 mg by mouth 2 (two) times daily.   Yes [provider]  predniSONE (DELTASONE) 5 MG tablet Take 5 mg by mouth daily. 03/09/24  Yes [provider]  propranolol  (INDERAL ) 40 MG tablet Take 1 tablet (40 mg total) by mouth 2 (two) times daily. 10/11/23  Yes Cottle, Lorene KANDICE Raddle., MD  simvastatin  (ZOCOR ) 40 MG tablet Take 40 mg by mouth daily.   Yes [provider]  tadalafil  (CIALIS ) 20 MG tablet TAKE 1 TABLET(20 MG) BY MOUTH DAILY AS NEEDED FOR ERECTILE DYSFUNCTION 02/24/24  Yes Cottle, Lorene KANDICE Raddle., MD  VITAMIN D PO Take 5,000 Units by mouth daily.   Yes [provider]   Social History   Socioeconomic History   Marital status: Divorced    Spouse name: Not on file   Number of children: 3   Years of education: Not on file   Highest education level: Bachelor's degree (e.g., BA, AB, BS)  Occupational History   Occupation: Tree Surgeon  Tobacco Use   Smoking status: Former    Types: E-cigarettes, Cigarettes    Quit date: 08/01/2009    Years since quitting: 15.1   Smokeless tobacco: Never  Vaping Use   Vaping status: Former   Start date: 08/12/2014  Substance and Sexual Activity   Alcohol use: No    Comment: not since 2006   Drug use: No   Sexual activity: Not on file    Comment: uses e-cig  Other Topics Concern   Not on file  Social History Narrative   Lives alone   No caffeine   Social Drivers of Health   Tobacco Use: Medium Risk (09/14/2024)   Patient History    Smoking Tobacco Use: Former    Smokeless Tobacco Use: Never    Passive Exposure: Not on Actuary Strain: Not on file  Food Insecurity: Not on file  Transportation Needs: Not on file  Physical Activity: Not on  file  Stress: Not on file  Social Connections: Unknown (12/28/2021)   Received [facility]   Social Network    Social Network: Not on file  Depression (PHQ2-9): Not on file  Alcohol Screen: Not on file  Housing: Not on file  Utilities: Not on file  Health Literacy: Not on file   Family History  Problem Relation Age of Onset   Stroke Mother    Diabetes Mother    Cancer Father    Parkinson's disease Maternal Grandmother    Colon cancer Neg Hx    Colon polyps Neg Hx    Esophageal cancer Neg Hx    Stomach  cancer Neg Hx    Rectal cancer Neg Hx     ROS: Currently denies lightheadedness, dizziness, Fever, chills, CP, SOB.   No personal history of DVT, PE, MI, or CVA. No loose teeth or dentures All other systems have been reviewed and were otherwise currently negative with the exception of those mentioned in the HPI and as above.  BMI: Estimated body mass index is 31.74 kg/m as calculated from the following:   Height as of 09/14/24: 6' (1.829 m).   Weight as of 09/14/24: 106.1 kg.  Lab Results  Component Value Date   ALBUMIN 3.8 01/29/2024   Diabetes: Patient does not have a diagnosis of diabetes.     Smoking Status:       Physical Exam: General: Alert, no acute distress Cardiovascular: No pedal edema Respiratory: No cyanosis, no use of accessory musculature GI: No organomegaly, abdomen is soft and non-tender Skin: No lesions in the area of chief complaint Neurologic: Sensation intact distally Psychiatric: Patient is competent for consent with normal mood and affect Lymphatic: No axillary or cervical lymphadenopathy MSK/Surgical Site: He can perform about 0 to 30 degrees of forward flexion at the left shoulder. 0 to 70 degrees of external rotation. Internal rotation to the lateral left thigh. All fingers flex extend and abduct however he is limited with flexion today due to stiffness from his PMR and cannot make a full fist today. Distal sensation intact.  Well-healed surgical wound at left shoulder. Sensation intact axillary nerve distribution.  Imaging Review MRI performed 07/24/24 shows post-surgical changes with suture anchors in the humeral head related to prior rotator cuff repair. There is an intermediate signal and irregularity of the supraspinatus tendon, possibly secondary to postsurgical changes versus tendinosis. A high-grade articular sided insertional tear of the supraspinatus tendon extending to the level of the critical zone with possible focal full thickness perforation is noted. Atrophy of the subscapularis muscle is present, with no significant muscle edema.  Assessment: 64 year old male with left shoulder pain and dysfunction, status post rotator cuff repair with possible supraspinatus tendon tear   Plan: Plan for Procedures: ARTHROPLASTY, SHOULDER, TOTAL, REVERSE  The patient history, physical exam, clinical judgement of the provider and imaging are consistent with end stage degenerative joint disease and reverse total joint arthroplasty is deemed medically necessary. The treatment options including medical management, injection therapy, and arthroplasty were discussed at length. The risks and benefits of Procedures: ARTHROPLASTY, SHOULDER, TOTAL, REVERSE were presented and reviewed.  The risks of nonoperative treatment, versus surgical intervention including but not limited to continued pain, aseptic loosening, stiffness, dislocation/subluxation, infection, bleeding, nerve injury, blood clots, cardiopulmonary complications, morbidity, mortality, among others were discussed. The patient verbalizes understanding and wishes to proceed with the plan.  Patient is being admitted for surgery, OT, pain control, prophylactic antibiotics, VTE prophylaxis, progressive ambulation, ADL's and discharge planning.   The patient does meet the criteria for TXA which will be used perioperatively.     Tramel Westbrook K Coretha Creswell, PA-C 09/15/2024 2:50 PM      [1]  Allergies Allergen Reactions   Quetiapine Other (See Comments)    Other Reaction(s): Gives him a drunk like feeling the next day

## 2024-09-26 ENCOUNTER — Ambulatory Visit (HOSPITAL_COMMUNITY): Admission: RE | Admit: 2024-09-26 | Source: Ambulatory Visit | Admitting: Orthopedic Surgery

## 2024-09-26 ENCOUNTER — Encounter (HOSPITAL_COMMUNITY): Admission: RE | Payer: Self-pay | Source: Ambulatory Visit

## 2024-12-19 ENCOUNTER — Ambulatory Visit: Admitting: Psychiatry

## 2024-12-20 ENCOUNTER — Ambulatory Visit: Admitting: Psychiatry
